# Patient Record
Sex: Female | Born: 1950 | Race: White | Hispanic: No | Marital: Married | Smoking: Former smoker
Health system: Southern US, Community
[De-identification: ages and names within clinical notes are randomized; demographics above are authoritative.]

## PROBLEM LIST (undated history)

## (undated) DIAGNOSIS — E559 Vitamin D deficiency, unspecified: Secondary | ICD-10-CM

## (undated) DIAGNOSIS — T7840XA Allergy, unspecified, initial encounter: Secondary | ICD-10-CM

## (undated) DIAGNOSIS — K649 Unspecified hemorrhoids: Secondary | ICD-10-CM

## (undated) DIAGNOSIS — C439 Malignant melanoma of skin, unspecified: Secondary | ICD-10-CM

## (undated) DIAGNOSIS — Z8619 Personal history of other infectious and parasitic diseases: Secondary | ICD-10-CM

## (undated) DIAGNOSIS — K219 Gastro-esophageal reflux disease without esophagitis: Secondary | ICD-10-CM

## (undated) DIAGNOSIS — E079 Disorder of thyroid, unspecified: Secondary | ICD-10-CM

## (undated) DIAGNOSIS — E538 Deficiency of other specified B group vitamins: Secondary | ICD-10-CM

## (undated) DIAGNOSIS — E785 Hyperlipidemia, unspecified: Secondary | ICD-10-CM

## (undated) DIAGNOSIS — Z5189 Encounter for other specified aftercare: Secondary | ICD-10-CM

## (undated) DIAGNOSIS — H269 Unspecified cataract: Secondary | ICD-10-CM

## (undated) HISTORY — DX: Vitamin D deficiency, unspecified: E55.9

## (undated) HISTORY — DX: Disorder of thyroid, unspecified: E07.9

## (undated) HISTORY — DX: Unspecified hemorrhoids: K64.9

## (undated) HISTORY — DX: Hyperlipidemia, unspecified: E78.5

## (undated) HISTORY — DX: Encounter for other specified aftercare: Z51.89

## (undated) HISTORY — PX: APPENDECTOMY: SHX54

## (undated) HISTORY — PX: MELANOMA EXCISION: SHX5266

## (undated) HISTORY — PX: CATARACT EXTRACTION, BILATERAL: SHX1313

## (undated) HISTORY — DX: Deficiency of other specified B group vitamins: E53.8

## (undated) HISTORY — DX: Personal history of other infectious and parasitic diseases: Z86.19

## (undated) HISTORY — DX: Allergy, unspecified, initial encounter: T78.40XA

## (undated) HISTORY — DX: Gastro-esophageal reflux disease without esophagitis: K21.9

## (undated) HISTORY — DX: Unspecified cataract: H26.9

## (undated) HISTORY — DX: Malignant melanoma of skin, unspecified: C43.9

## (undated) HISTORY — PX: COLONOSCOPY: SHX174

---

## 1998-08-11 ENCOUNTER — Ambulatory Visit (HOSPITAL_COMMUNITY): Admission: RE | Admit: 1998-08-11 | Discharge: 1998-08-11 | Payer: Self-pay | Admitting: Radiology

## 2003-07-04 ENCOUNTER — Other Ambulatory Visit: Admission: RE | Admit: 2003-07-04 | Discharge: 2003-07-04 | Payer: Self-pay | Admitting: Family Medicine

## 2004-07-10 ENCOUNTER — Other Ambulatory Visit: Admission: RE | Admit: 2004-07-10 | Discharge: 2004-07-10 | Payer: Self-pay | Admitting: Family Medicine

## 2005-07-16 ENCOUNTER — Other Ambulatory Visit: Admission: RE | Admit: 2005-07-16 | Discharge: 2005-07-16 | Payer: Self-pay | Admitting: *Deleted

## 2006-09-08 ENCOUNTER — Other Ambulatory Visit: Admission: RE | Admit: 2006-09-08 | Discharge: 2006-09-08 | Payer: Self-pay | Admitting: Family Medicine

## 2009-12-21 LAB — HM COLONOSCOPY: HM Colonoscopy: NORMAL

## 2010-05-23 ENCOUNTER — Other Ambulatory Visit: Payer: Self-pay | Admitting: Family Medicine

## 2010-05-23 ENCOUNTER — Other Ambulatory Visit (HOSPITAL_COMMUNITY)
Admission: RE | Admit: 2010-05-23 | Discharge: 2010-05-23 | Disposition: A | Discharge status: Home / Self Care | Payer: 59 | Source: Ambulatory Visit | Attending: Family Medicine | Admitting: Family Medicine

## 2010-05-23 DIAGNOSIS — Z124 Encounter for screening for malignant neoplasm of cervix: Secondary | ICD-10-CM | POA: Insufficient documentation

## 2010-05-23 DIAGNOSIS — Z1159 Encounter for screening for other viral diseases: Secondary | ICD-10-CM | POA: Insufficient documentation

## 2010-05-23 LAB — HM PAP SMEAR: HM Pap smear: NORMAL

## 2011-09-25 LAB — HM MAMMOGRAPHY

## 2012-11-05 LAB — LIPID PANEL
LDL Cholesterol: 187 mg/dL
LDl/HDL Ratio: 5.9

## 2012-11-05 LAB — TSH: TSH: 1.51 u[IU]/mL (ref <=5.90)

## 2012-11-05 LAB — BASIC METABOLIC PANEL WITH GFR: Glucose: 102 mg/dL

## 2013-02-25 DIAGNOSIS — K5792 Diverticulitis of intestine, part unspecified, without perforation or abscess without bleeding: Secondary | ICD-10-CM

## 2013-02-25 HISTORY — DX: Diverticulitis of intestine, part unspecified, without perforation or abscess without bleeding: K57.92

## 2013-12-08 ENCOUNTER — Telehealth: Payer: Self-pay | Admitting: *Deleted

## 2013-12-08 NOTE — Telephone Encounter (Signed)
Medical records received via fax from [place]. Records forwarded to Dr. Virgil Benedict CMA, Katherine Roan, for review/abstraction. JG//CMA

## 2013-12-21 ENCOUNTER — Encounter: Payer: Self-pay | Admitting: Family Medicine

## 2013-12-21 ENCOUNTER — Ambulatory Visit (INDEPENDENT_AMBULATORY_CARE_PROVIDER_SITE_OTHER): Payer: 59 | Admitting: Family Medicine

## 2013-12-21 VITALS — BP 122/84 | HR 81 | Temp 98.3°F | Resp 16 | Ht 69.25 in | Wt 232.1 lb

## 2013-12-21 DIAGNOSIS — E785 Hyperlipidemia, unspecified: Secondary | ICD-10-CM | POA: Insufficient documentation

## 2013-12-21 DIAGNOSIS — M6289 Other specified disorders of muscle: Secondary | ICD-10-CM | POA: Insufficient documentation

## 2013-12-21 DIAGNOSIS — C439 Malignant melanoma of skin, unspecified: Secondary | ICD-10-CM | POA: Insufficient documentation

## 2013-12-21 DIAGNOSIS — E669 Obesity, unspecified: Secondary | ICD-10-CM | POA: Insufficient documentation

## 2013-12-21 DIAGNOSIS — E039 Hypothyroidism, unspecified: Secondary | ICD-10-CM | POA: Insufficient documentation

## 2013-12-21 DIAGNOSIS — F329 Major depressive disorder, single episode, unspecified: Secondary | ICD-10-CM | POA: Insufficient documentation

## 2013-12-21 DIAGNOSIS — E038 Other specified hypothyroidism: Secondary | ICD-10-CM

## 2013-12-21 DIAGNOSIS — F32A Depression, unspecified: Secondary | ICD-10-CM

## 2013-12-21 DIAGNOSIS — E782 Mixed hyperlipidemia: Secondary | ICD-10-CM | POA: Insufficient documentation

## 2013-12-21 DIAGNOSIS — L989 Disorder of the skin and subcutaneous tissue, unspecified: Secondary | ICD-10-CM

## 2013-12-21 DIAGNOSIS — R7309 Other abnormal glucose: Secondary | ICD-10-CM | POA: Insufficient documentation

## 2013-12-21 LAB — HEPATIC FUNCTION PANEL
ALBUMIN: 3.8 g/dL (ref 3.5–5.2)
ALT: 21 U/L (ref 0–35)
AST: 21 U/L (ref 0–37)
Alkaline Phosphatase: 55 U/L (ref 39–117)
Bilirubin, Direct: 0.1 mg/dL (ref 0.0–0.3)
Total Bilirubin: 0.8 mg/dL (ref 0.2–1.2)
Total Protein: 7.2 g/dL (ref 6.0–8.3)

## 2013-12-21 LAB — BASIC METABOLIC PANEL
BUN: 14 mg/dL (ref 6–23)
CALCIUM: 10.4 mg/dL (ref 8.4–10.5)
CO2: 25 meq/L (ref 19–32)
Chloride: 106 mEq/L (ref 96–112)
Creatinine, Ser: 0.9 mg/dL (ref 0.4–1.2)
GFR: 66.17 mL/min (ref >60.00)
Glucose, Bld: 98 mg/dL (ref 70–99)
POTASSIUM: 5 meq/L (ref 3.5–5.1)
SODIUM: 139 meq/L (ref 135–145)

## 2013-12-21 LAB — HEMOGLOBIN A1C: HEMOGLOBIN A1C: 5.9 % (ref 4.6–6.5)

## 2013-12-21 LAB — CBC WITH DIFFERENTIAL/PLATELET
BASOS ABS: 0 10*3/uL (ref 0.0–0.1)
BASOS PCT: 0.8 % (ref 0.0–3.0)
EOS ABS: 0.1 10*3/uL (ref 0.0–0.7)
Eosinophils Relative: 3.3 % (ref 0.0–5.0)
HCT: 40.4 % (ref 36.0–46.0)
HEMOGLOBIN: 13.5 g/dL (ref 12.0–15.0)
Lymphocytes Relative: 28.4 % (ref 12.0–46.0)
Lymphs Abs: 1.3 10*3/uL (ref 0.7–4.0)
MCHC: 33.3 g/dL (ref 30.0–36.0)
MCV: 95.3 fl (ref 78.0–100.0)
MONO ABS: 0.4 10*3/uL (ref 0.1–1.0)
Monocytes Relative: 9.7 % (ref 3.0–12.0)
NEUTROS ABS: 2.6 10*3/uL (ref 1.4–7.7)
Neutrophils Relative %: 57.8 % (ref 43.0–77.0)
Platelets: 198 10*3/uL (ref 150.0–400.0)
RBC: 4.24 Mil/uL (ref 3.87–5.11)
RDW: 13.6 % (ref 11.5–15.5)
WBC: 4.5 10*3/uL (ref 4.0–10.5)

## 2013-12-21 LAB — LIPID PANEL
Cholesterol: 299 mg/dL — ABNORMAL HIGH (ref 0–200)
HDL: 43 mg/dL (ref >39.00)
NonHDL: 256
TRIGLYCERIDES: 246 mg/dL — AB (ref 0.0–149.0)
Total CHOL/HDL Ratio: 7
VLDL: 49.2 mg/dL — AB (ref 0.0–40.0)

## 2013-12-21 LAB — TSH: TSH: 24.42 u[IU]/mL — AB (ref 0.35–4.50)

## 2013-12-21 LAB — CK: Total CK: 153 U/L (ref 7–177)

## 2013-12-21 LAB — LDL CHOLESTEROL, DIRECT: Direct LDL: 195.4 mg/dL

## 2013-12-21 NOTE — Assessment & Plan Note (Signed)
New to provider, ongoing for pt.  On Synthroid.  Currently asymptomatic.  Check labs.  Adjust meds prn

## 2013-12-21 NOTE — Assessment & Plan Note (Signed)
New to provider, ongoing for pt.  Attempting to control w/ healthy diet and regular exercise but recently exercise has been limited.  Check labs.  Start meds prn.

## 2013-12-21 NOTE — Patient Instructions (Signed)
We'll notify you of your lab results and determine the next steps We'll call you with your dermatology appt for the skin lesions We will try and figure out what is going on or refer you to someone who can Call with any questions or concerns Welcome!  We're glad to have you!

## 2013-12-21 NOTE — Progress Notes (Signed)
   Subjective:    Patient ID: Olivia Hodge, female    DOB: [birth year], 63 y.o.   MRN: [id number]  HPI New to establish.  Previous MD- Kathyrn Lass.  Hypothyroid- chronic problem.  Currently on Synthroid.  Denies heat/cold intolerance.  Hyperlipidemia- ongoing issue, currently not on meds.  Attempting to control w/ diet and exercise.  Denies CP, SOB, HAs, some blurry vision- particularly w/ adjusting rapidly from near to far.  Elevated blood sugars- last 2 yrs fasting labs were 102, 105.  No hx of diabetes.  No known family hx.    Depression- ongoing issue.  Pt feels sxs are well controlled on Zoloft.  'it works very good'.  Skin lesions- pt has multiple areas of concern and would like referral for evaluation.  B LE fatigue- 'my legs are just exhausted'.  Difficulty w/ climbing, walking.  Now having difficulty holding things for prolonged periods of time.  Does a lot of water walking.  Has hx of this previously but episode resolved.  Describes a 'burning' w/ any incline.  + eye fatigue.   Review of Systems For ROS see HPI     Objective:   Physical Exam  Vitals reviewed. Constitutional: She is oriented to person, place, and time. She appears well-developed and well-nourished. No distress.  HENT:  Head: Normocephalic and atraumatic.  Eyes: Conjunctivae and EOM are normal. Pupils are equal, round, and reactive to light.  Neck: Normal range of motion. Neck supple. No thyromegaly present.  Cardiovascular: Normal rate, regular rhythm, normal heart sounds and intact distal pulses.   No murmur heard. Pulmonary/Chest: Effort normal and breath sounds normal. No respiratory distress.  Abdominal: Soft. She exhibits no distension. There is no tenderness.  Musculoskeletal: She exhibits no edema.  Lymphadenopathy:    She has no cervical adenopathy.  Neurological: She is alert and oriented to person, place, and time.  Skin: Skin is warm and dry.  Psychiatric: She has a normal mood and affect.  Her behavior is normal.          Assessment & Plan:

## 2013-12-21 NOTE — Assessment & Plan Note (Signed)
New.  Refer to derm due to pt's sun exposure.  Pt expressed understanding and is in agreement w/ plan.

## 2013-12-21 NOTE — Progress Notes (Signed)
Pre visit review using our clinic review tool, if applicable. No additional management support is needed unless otherwise documented below in the visit note. 

## 2013-12-21 NOTE — Assessment & Plan Note (Signed)
New to provider, ongoing for pt.  sxs well controlled on current meds.  No changes at this time.  Will follow.

## 2013-12-21 NOTE — Assessment & Plan Note (Signed)
New.  Pt reports sxs are intermittent.  Worse when walking an incline.  Reports eyes, shoulders, and hips/thighs are easily fatigue-able.  Has not had any work up done for this.  Will rule out metabolic abnormalities w/ labs and will also get acetylcholine antibodies to assess for myasthenia gravis.  Pt may need neuro referral for complete evaluation and treatment.  Will decide next steps once labs available for review.

## 2013-12-21 NOTE — Assessment & Plan Note (Signed)
New to provider.  Pt w/ hx of this x2 years.  No known family hx of DM.  Check labs.  Will follow closely.

## 2013-12-23 LAB — ACETYLCHOLINE RECEPTOR, BINDING: A CHR BINDING ABS: <0.3 nmol/L (ref <=0.30)

## 2013-12-24 ENCOUNTER — Encounter: Payer: Self-pay | Admitting: General Practice

## 2013-12-28 ENCOUNTER — Telehealth: Payer: Self-pay | Admitting: Family Medicine

## 2013-12-28 ENCOUNTER — Other Ambulatory Visit: Payer: Self-pay | Admitting: Family Medicine

## 2013-12-28 DIAGNOSIS — E038 Other specified hypothyroidism: Secondary | ICD-10-CM

## 2013-12-28 NOTE — Telephone Encounter (Signed)
Pt notified of results

## 2013-12-28 NOTE — Telephone Encounter (Signed)
Pt was referred to Endocrinology for Thyroid at her request. Pt did not want a 353mcg tablet sent to her pharmacy. She instead wants to take 1 and 1/2 of her 211mcg tablets she has at home (chart updated to reflect). Pt also refused cholesterol medication at this time.

## 2013-12-28 NOTE — Telephone Encounter (Signed)
Agree w/ endo referral but pt needs to be aware that refusal of treatment of high cholesterol- nearly twice goal- is putting her at higher risk of heart attack and/or stroke.  Recommend that she reconsider cholesterol treatment.

## 2013-12-28 NOTE — Telephone Encounter (Signed)
Pt states she would like a call back to discuss her labs that were done last week.

## 2013-12-28 NOTE — Telephone Encounter (Signed)
Pt notified, states she would like to talk to endo about that as well if possible.

## 2014-01-04 ENCOUNTER — Ambulatory Visit (INDEPENDENT_AMBULATORY_CARE_PROVIDER_SITE_OTHER): Payer: 59 | Admitting: Endocrinology

## 2014-01-04 ENCOUNTER — Encounter: Payer: Self-pay | Admitting: Endocrinology

## 2014-01-04 VITALS — BP 125/84 | HR 90 | Temp 98.5°F | Ht 69.25 in | Wt 234.0 lb

## 2014-01-04 DIAGNOSIS — R200 Anesthesia of skin: Secondary | ICD-10-CM

## 2014-01-04 DIAGNOSIS — E039 Hypothyroidism, unspecified: Secondary | ICD-10-CM

## 2014-01-04 NOTE — Patient Instructions (Addendum)
Please see the information below, about taking the thyroid pill.  Please redo the blood test in 1 month (we'll check the vitamin B-12 also).       Levothyroxine tablets What is this medicine? LEVOTHYROXINE (lee voe thye ROX een) is a thyroid hormone. This medicine can improve symptoms of thyroid deficiency such as slow speech, lack of energy, weight gain, hair loss, dry skin, and feeling cold. It also helps to treat goiter (an enlarged thyroid gland). It is also used to treat some kinds of thyroid cancer along with surgery and other medicines. This medicine may be used for other purposes; ask your health care provider or pharmacist if you have questions. COMMON BRAND NAME(S): Estre, Levo-T, Levothroid, Levoxyl, Synthroid, Thyro-Tabs, Unithroid What should I tell my health care provider before I take this medicine? They need to know if you have any of these conditions: -angina -blood clotting problems -diabetes -dieting or on a weight loss program -fertility problems -heart disease -high levels of thyroid hormone -pituitary gland problem -previous heart attack -an unusual or allergic reaction to levothyroxine, thyroid hormones, other medicines, foods, dyes, or preservatives -pregnant or trying to get pregnant -breast-feeding How should I use this medicine? Take this medicine by mouth with plenty of water. It is best to take on an empty stomach, at least 30 minutes before or 2 hours after food. Follow the directions on the prescription label. Take at the same time each day. Do not take your medicine more often than directed. Contact your pediatrician regarding the use of this medicine in children. While this drug may be prescribed for children and infants as young as a few days of age for selected conditions, precautions do apply. For infants, you may crush the tablet and place in a small amount of (5-10 ml or 1 to 2 teaspoonfuls) of water, breast milk, or non-soy based infant formula. Do  not mix with soy-based infant formula. Give as directed. Overdosage: If you think you have taken too much of this medicine contact a poison control center or emergency room at once. NOTE: This medicine is only for you. Do not share this medicine with others. What if I miss a dose? If you miss a dose, take it as soon as you can. If it is almost time for your next dose, take only that dose. Do not take double or extra doses. What may interact with this medicine? -amiodarone -antacids -anti-thyroid medicines -calcium supplements -carbamazepine -cholestyramine -colestipol -digoxin -female hormones, including contraceptive or birth control pills -iron supplements -ketamine -liquid nutrition products like Ensure -medicines for colds and breathing difficulties -medicines for diabetes -medicines for mental depression -medicines or herbals used to decrease weight or appetite -phenobarbital or other barbiturate medications -phenytoin -prednisone or other corticosteroids -rifabutin -rifampin -soy isoflavones -sucralfate -theophylline -warfarin This list may not describe all possible interactions. Give your health care provider a list of all the medicines, herbs, non-prescription drugs, or dietary supplements you use. Also tell them if you smoke, drink alcohol, or use illegal drugs. Some items may interact with your medicine. What should I watch for while using this medicine? Be sure to take this medicine with plenty of fluids. Some tablets may cause choking, gagging, or difficulty swallowing from the tablet getting stuck in your throat. Most of these problems disappear if the medicine is taken with the right amount of water or other fluids. Do not switch brands of this medicine unless your health care professional agrees with the change. Ask questions if you are  uncertain. You will need regular exams and occasional blood tests to check the response to treatment. If you are receiving this  medicine for an underactive thyroid, it may be several weeks before you notice an improvement. Check with your doctor or health care professional if your symptoms do not improve. It may be necessary for you to take this medicine for the rest of your life. Do not stop using this medicine unless your doctor or health care professional advises you to. This medicine can affect blood sugar levels. If you have diabetes, check your blood sugar as directed. You may lose some of your hair when you first start treatment. With time, this usually corrects itself. If you are going to have surgery, tell your doctor or health care professional that you are taking this medicine. What side effects may I notice from receiving this medicine? Side effects that you should report to your doctor or health care professional as soon as possible: -allergic reactions like skin rash, itching or hives, swelling of the face, lips, or tongue -chest pain -excessive sweating or intolerance to heat -fast or irregular heartbeat -nervousness -skin rash or hives -swelling of ankles, feet, or legs -tremors Side effects that usually do not require medical attention (report to your doctor or health care professional if they continue or are bothersome): -changes in appetite -changes in menstrual periods -diarrhea -hair loss -headache -trouble sleeping -weight loss This list may not describe all possible side effects. Call your doctor for medical advice about side effects. You may report side effects to FDA at 1-800-FDA-1088. Where should I keep my medicine? Keep out of the reach of children. Store at room temperature between 15 and 30 degrees C (59 and 86 degrees F). Protect from light and moisture. Keep container tightly closed. Throw away any unused medicine after the expiration date. NOTE: This sheet is a summary. It may not cover all possible information. If you have questions about this medicine, talk to your doctor,  pharmacist, or health care provider.  2015, Elsevier/Gold Standard. (2008-05-20 14:28:07)

## 2014-01-04 NOTE — Progress Notes (Signed)
Subjective:    Patient ID: Olivia Hodge, female    DOB: August 12, 1950, 63 y.o.   MRN: [id number]  HPI Pt reports hypothyroidism was dx'ed in 1977.  she has been on prescribed thyroid hormone therapy since then.  she has never taken kelp or any other type of non-prescribed thyroid product.  she has never had thyroid imaging.  she has never had thyroid surgery, or XRT to the neck.  she has never been on amiodarone or lithium.  She is ref here because of persistently elevated TSH despite a high dosage of synthroid.  She has moderate weakness of all 4 extremities, and assoc doe.  She still takes levothyroxine, 200 mcg/day.  She has no h/o malabsorption.  She does not currently take synthroid on an empty stomach.  She says she never misses it.  Over the years, her dosage requirement has varied from 125-200 mcg/day, but never higher than 200/d.   Past Medical History  Diagnosis Date  . Thyroid disease     Past Surgical History  Procedure Laterality Date  . Appendectomy      History   Social History  . Marital Status: Divorced    Spouse Name: N/A    Number of Children: N/A  . Years of Education: N/A   Occupational History  . Not on file.   Social History Main Topics  . Smoking status: Former Smoker    Quit date: 02/26/1992  . Smokeless tobacco: Not on file  . Alcohol Use: Yes  . Drug Use: No  . Sexual Activity: Not on file   Other Topics Concern  . Not on file   Social History Narrative    Current Outpatient Prescriptions on File Prior to Visit  Medication Sig Dispense Refill  . levothyroxine (SYNTHROID, LEVOTHROID) 200 MCG tablet Pt is taking 1 and 1/2 tablet daily.    Marland Kitchen loratadine (CLARITIN) 10 MG tablet Take 10 mg by mouth daily.    . NON FORMULARY Doterra supplements    . sertraline (ZOLOFT) 100 MG tablet Take 1 tablet by mouth daily.     No current facility-administered medications on file prior to visit.    Allergies  Allergen Reactions  . Penicillins Hives  .  Tetracyclines & Related Nausea And Vomiting    Family History  Problem Relation Age of Onset  . Adopted: Yes  . Thyroid disease Daughter     BP 125/84 mmHg  Pulse 90  Temp(Src) 98.5 F (36.9 C) (Oral)  Ht 5' 9.25" (1.759 m)  Wt 234 lb (106.142 kg)  BMI 34.30 kg/m2  SpO2 92%  Review of Systems denies hair loss, cough, memory loss, blurry vision, cold intolerance, rhinorrhea, easy bruising, and syncope.  She has gerd, weight gain, acral numbness, dry skin, and leg cramps. Depression is well-controlled.      Objective:   Physical Exam VITAL SIGNS:  See vs page GENERAL: no distress head: no deformity eyes: no periorbital swelling, no proptosis external nose and ears are normal NECK: There is no palpable thyroid enlargement.  No thyroid nodule is palpable.  No palpable lymphadenopathy at the anterior neck. MSK: strength diffusely 5/5 Gait: normal and steady.    Lab Results  Component Value Date   TSH 24.42* 12/21/2013   i have reviewed the following old records: 12/21/13 office note: depression is well-controlled, but she has muscle weakness.     Assessment & Plan:  Hypothyroidism, new to me.  The reason for her fluctuating synthroid requirement is unclear.  Patient is advised the following: Patient Instructions  Please see the information below, about taking the thyroid pill.  Please redo the blood test in 1 month (we'll check the vitamin B-12 also).       Levothyroxine tablets What is this medicine? LEVOTHYROXINE (lee voe thye ROX een) is a thyroid hormone. This medicine can improve symptoms of thyroid deficiency such as slow speech, lack of energy, weight gain, hair loss, dry skin, and feeling cold. It also helps to treat goiter (an enlarged thyroid gland). It is also used to treat some kinds of thyroid cancer along with surgery and other medicines. This medicine may be used for other purposes; ask your health care provider or pharmacist if you have  questions. COMMON BRAND NAME(S): Estre, Levo-T, Levothroid, Levoxyl, Synthroid, Thyro-Tabs, Unithroid What should I tell my health care provider before I take this medicine? They need to know if you have any of these conditions: -angina -blood clotting problems -diabetes -dieting or on a weight loss program -fertility problems -heart disease -high levels of thyroid hormone -pituitary gland problem -previous heart attack -an unusual or allergic reaction to levothyroxine, thyroid hormones, other medicines, foods, dyes, or preservatives -pregnant or trying to get pregnant -breast-feeding How should I use this medicine? Take this medicine by mouth with plenty of water. It is best to take on an empty stomach, at least 30 minutes before or 2 hours after food. Follow the directions on the prescription label. Take at the same time each day. Do not take your medicine more often than directed. Contact your pediatrician regarding the use of this medicine in children. While this drug may be prescribed for children and infants as young as a few days of age for selected conditions, precautions do apply. For infants, you may crush the tablet and place in a small amount of (5-10 ml or 1 to 2 teaspoonfuls) of water, breast milk, or non-soy based infant formula. Do not mix with soy-based infant formula. Give as directed. Overdosage: If you think you have taken too much of this medicine contact a poison control center or emergency room at once. NOTE: This medicine is only for you. Do not share this medicine with others. What if I miss a dose? If you miss a dose, take it as soon as you can. If it is almost time for your next dose, take only that dose. Do not take double or extra doses. What may interact with this medicine? -amiodarone -antacids -anti-thyroid medicines -calcium supplements -carbamazepine -cholestyramine -colestipol -digoxin -female hormones, including contraceptive or birth control  pills -iron supplements -ketamine -liquid nutrition products like Ensure -medicines for colds and breathing difficulties -medicines for diabetes -medicines for mental depression -medicines or herbals used to decrease weight or appetite -phenobarbital or other barbiturate medications -phenytoin -prednisone or other corticosteroids -rifabutin -rifampin -soy isoflavones -sucralfate -theophylline -warfarin This list may not describe all possible interactions. Give your health care provider a list of all the medicines, herbs, non-prescription drugs, or dietary supplements you use. Also tell them if you smoke, drink alcohol, or use illegal drugs. Some items may interact with your medicine. What should I watch for while using this medicine? Be sure to take this medicine with plenty of fluids. Some tablets may cause choking, gagging, or difficulty swallowing from the tablet getting stuck in your throat. Most of these problems disappear if the medicine is taken with the right amount of water or other fluids. Do not switch brands of this medicine unless your health care professional  agrees with the change. Ask questions if you are uncertain. You will need regular exams and occasional blood tests to check the response to treatment. If you are receiving this medicine for an underactive thyroid, it may be several weeks before you notice an improvement. Check with your doctor or health care professional if your symptoms do not improve. It may be necessary for you to take this medicine for the rest of your life. Do not stop using this medicine unless your doctor or health care professional advises you to. This medicine can affect blood sugar levels. If you have diabetes, check your blood sugar as directed. You may lose some of your hair when you first start treatment. With time, this usually corrects itself. If you are going to have surgery, tell your doctor or health care professional that you are taking  this medicine. What side effects may I notice from receiving this medicine? Side effects that you should report to your doctor or health care professional as soon as possible: -allergic reactions like skin rash, itching or hives, swelling of the face, lips, or tongue -chest pain -excessive sweating or intolerance to heat -fast or irregular heartbeat -nervousness -skin rash or hives -swelling of ankles, feet, or legs -tremors Side effects that usually do not require medical attention (report to your doctor or health care professional if they continue or are bothersome): -changes in appetite -changes in menstrual periods -diarrhea -hair loss -headache -trouble sleeping -weight loss This list may not describe all possible side effects. Call your doctor for medical advice about side effects. You may report side effects to FDA at 1-800-FDA-1088. Where should I keep my medicine? Keep out of the reach of children. Store at room temperature between 15 and 30 degrees C (59 and 86 degrees F). Protect from light and moisture. Keep container tightly closed. Throw away any unused medicine after the expiration date. NOTE: This sheet is a summary. It may not cover all possible information. If you have questions about this medicine, talk to your doctor, pharmacist, or health care provider.  2015, [employer] Standard. (2008-05-20 14:28:07)

## 2014-02-03 ENCOUNTER — Other Ambulatory Visit: Payer: Self-pay | Admitting: Endocrinology

## 2014-02-03 ENCOUNTER — Other Ambulatory Visit (INDEPENDENT_AMBULATORY_CARE_PROVIDER_SITE_OTHER): Payer: 59

## 2014-02-03 ENCOUNTER — Other Ambulatory Visit: Payer: 59

## 2014-02-03 DIAGNOSIS — R200 Anesthesia of skin: Secondary | ICD-10-CM

## 2014-02-03 DIAGNOSIS — E039 Hypothyroidism, unspecified: Secondary | ICD-10-CM

## 2014-02-03 LAB — VITAMIN B12: Vitamin B-12: 206 pg/mL — ABNORMAL LOW (ref 211–911)

## 2014-02-03 LAB — TSH: TSH: 0.08 u[IU]/mL — AB (ref 0.35–4.50)

## 2014-02-03 LAB — T4, FREE: Free T4: 2.04 ng/dL — ABNORMAL HIGH (ref 0.60–1.60)

## 2014-02-03 MED ORDER — CYANOCOBALAMIN 1000 MCG/ML IJ SOLN: 1000.0000 ug | Freq: Once | INTRAMUSCULAR | Status: DC

## 2014-02-03 MED ORDER — LEVOTHYROXINE SODIUM 137 MCG PO TABS: ORAL_TABLET | ORAL | Status: DC

## 2014-02-04 ENCOUNTER — Ambulatory Visit (INDEPENDENT_AMBULATORY_CARE_PROVIDER_SITE_OTHER): Payer: 59 | Admitting: *Deleted

## 2014-02-04 DIAGNOSIS — R7989 Other specified abnormal findings of blood chemistry: Secondary | ICD-10-CM

## 2014-02-04 DIAGNOSIS — E538 Deficiency of other specified B group vitamins: Secondary | ICD-10-CM

## 2014-02-04 MED ORDER — CYANOCOBALAMIN 1000 MCG/ML IJ SOLN
1000.0000 ug | Freq: Once | INTRAMUSCULAR | Status: AC
  Administered 2014-02-04: 1000 ug via INTRAMUSCULAR

## 2014-02-04 NOTE — Progress Notes (Signed)
Pt came in for her first Vit B12 injection per Dr. Loanne Drilling.  Pt tolerated injection well.  Next injection in 1 month.//AB/CMA

## 2014-02-16 ENCOUNTER — Emergency Department (HOSPITAL_BASED_OUTPATIENT_CLINIC_OR_DEPARTMENT_OTHER): Payer: 59

## 2014-02-16 ENCOUNTER — Encounter (HOSPITAL_BASED_OUTPATIENT_CLINIC_OR_DEPARTMENT_OTHER): Payer: Self-pay | Admitting: *Deleted

## 2014-02-16 ENCOUNTER — Encounter: Payer: Self-pay | Admitting: Medical

## 2014-02-16 ENCOUNTER — Ambulatory Visit (INDEPENDENT_AMBULATORY_CARE_PROVIDER_SITE_OTHER): Payer: 59 | Admitting: Medical

## 2014-02-16 ENCOUNTER — Emergency Department (HOSPITAL_BASED_OUTPATIENT_CLINIC_OR_DEPARTMENT_OTHER)
Admission: EM | Admit: 2014-02-16 | Discharge: 2014-02-16 | Disposition: A | Discharge status: Home / Self Care | Payer: 59 | Attending: Emergency Medicine | Admitting: Emergency Medicine

## 2014-02-16 VITALS — BP 122/80 | HR 108 | Temp 100.1°F | Ht 69.25 in | Wt 224.2 lb

## 2014-02-16 DIAGNOSIS — E079 Disorder of thyroid, unspecified: Secondary | ICD-10-CM | POA: Diagnosis not present

## 2014-02-16 DIAGNOSIS — Z79899 Other long term (current) drug therapy: Secondary | ICD-10-CM | POA: Insufficient documentation

## 2014-02-16 DIAGNOSIS — Z87891 Personal history of nicotine dependence: Secondary | ICD-10-CM | POA: Diagnosis not present

## 2014-02-16 DIAGNOSIS — Z880 Allergy status to penicillin: Secondary | ICD-10-CM | POA: Insufficient documentation

## 2014-02-16 DIAGNOSIS — K5792 Diverticulitis of intestine, part unspecified, without perforation or abscess without bleeding: Secondary | ICD-10-CM | POA: Insufficient documentation

## 2014-02-16 DIAGNOSIS — R1032 Left lower quadrant pain: Secondary | ICD-10-CM

## 2014-02-16 DIAGNOSIS — R1031 Right lower quadrant pain: Secondary | ICD-10-CM | POA: Diagnosis present

## 2014-02-16 LAB — COMPREHENSIVE METABOLIC PANEL
ALBUMIN: 3.9 g/dL (ref 3.5–5.2)
ALT: 19 U/L (ref 0–35)
ANION GAP: 8 (ref 5–15)
AST: 17 U/L (ref 0–37)
Alkaline Phosphatase: 68 U/L (ref 39–117)
BUN: 11 mg/dL (ref 6–23)
CO2: 26 mmol/L (ref 19–32)
CREATININE: 0.63 mg/dL (ref 0.50–1.10)
Calcium: 10.2 mg/dL (ref 8.4–10.5)
Chloride: 104 mEq/L (ref 96–112)
GFR calc non Af Amer: >90 mL/min (ref >90)
GLUCOSE: 116 mg/dL — AB (ref 70–99)
Potassium: 3.9 mmol/L (ref 3.5–5.1)
Sodium: 138 mmol/L (ref 135–145)
TOTAL PROTEIN: 7.1 g/dL (ref 6.0–8.3)
Total Bilirubin: 0.9 mg/dL (ref 0.3–1.2)

## 2014-02-16 LAB — CBC WITH DIFFERENTIAL/PLATELET
BASOS PCT: 0 % (ref 0–1)
Basophils Absolute: 0.1 10*3/uL (ref 0.0–0.1)
EOS ABS: 0.1 10*3/uL (ref 0.0–0.7)
Eosinophils Relative: 1 % (ref 0–5)
HEMATOCRIT: 39.4 % (ref 36.0–46.0)
Hemoglobin: 13.4 g/dL (ref 12.0–15.0)
Lymphocytes Relative: 14 % (ref 12–46)
Lymphs Abs: 1.7 10*3/uL (ref 0.7–4.0)
MCH: 31.8 pg (ref 26.0–34.0)
MCHC: 34 g/dL (ref 30.0–36.0)
MCV: 93.6 fL (ref 78.0–100.0)
MONOS PCT: 10 % (ref 3–12)
Monocytes Absolute: 1.3 10*3/uL — ABNORMAL HIGH (ref 0.1–1.0)
NEUTROS PCT: 75 % (ref 43–77)
Neutro Abs: 9.3 10*3/uL — ABNORMAL HIGH (ref 1.7–7.7)
Platelets: 271 10*3/uL (ref 150–400)
RBC: 4.21 MIL/uL (ref 3.87–5.11)
RDW: 11.9 % (ref 11.5–15.5)
WBC: 12.4 10*3/uL — ABNORMAL HIGH (ref 4.0–10.5)

## 2014-02-16 LAB — URINALYSIS, ROUTINE W REFLEX MICROSCOPIC
Bilirubin Urine: NEGATIVE
Glucose, UA: NEGATIVE mg/dL
Ketones, ur: NEGATIVE mg/dL
Nitrite: NEGATIVE
PH: 6.5 (ref 5.0–8.0)
Protein, ur: NEGATIVE mg/dL
Specific Gravity, Urine: 1.018 (ref 1.005–1.030)
Urobilinogen, UA: 0.2 mg/dL (ref 0.0–1.0)

## 2014-02-16 LAB — LIPASE, BLOOD: Lipase: 21 U/L (ref 11–59)

## 2014-02-16 LAB — URINE MICROSCOPIC-ADD ON

## 2014-02-16 MED ORDER — IOHEXOL 300 MG/ML  SOLN
100.0000 mL | Freq: Once | INTRAMUSCULAR | Status: AC | PRN
  Administered 2014-02-16: 100 mL via INTRAVENOUS

## 2014-02-16 MED ORDER — ACETAMINOPHEN 500 MG PO TABS
1000.0000 mg | ORAL_TABLET | Freq: Once | ORAL | Status: AC
  Administered 2014-02-16: 1000 mg via ORAL
  Filled 2014-02-16: qty 2

## 2014-02-16 MED ORDER — METRONIDAZOLE 500 MG PO TABS
ORAL_TABLET | ORAL | Status: AC
  Filled 2014-02-16: qty 1

## 2014-02-16 MED ORDER — CIPROFLOXACIN HCL 500 MG PO TABS
ORAL_TABLET | ORAL | Status: AC
  Filled 2014-02-16: qty 1

## 2014-02-16 MED ORDER — SODIUM CHLORIDE 0.9 % IV BOLUS (SEPSIS)
1000.0000 mL | Freq: Once | INTRAVENOUS | Status: AC
  Administered 2014-02-16: 1000 mL via INTRAVENOUS

## 2014-02-16 MED ORDER — METRONIDAZOLE 500 MG PO TABS: 500.0000 mg | ORAL_TABLET | Freq: Two times a day (BID) | ORAL | Status: DC

## 2014-02-16 MED ORDER — METRONIDAZOLE 500 MG PO TABS
500.0000 mg | ORAL_TABLET | Freq: Once | ORAL | Status: AC
  Administered 2014-02-16: 500 mg via ORAL
  Filled 2014-02-16: qty 1

## 2014-02-16 MED ORDER — IOHEXOL 300 MG/ML  SOLN
25.0000 mL | Freq: Once | INTRAMUSCULAR | Status: AC | PRN
  Administered 2014-02-16: 25 mL via ORAL

## 2014-02-16 MED ORDER — CIPROFLOXACIN HCL 500 MG PO TABS
500.0000 mg | ORAL_TABLET | Freq: Once | ORAL | Status: AC
  Administered 2014-02-16: 500 mg via ORAL
  Filled 2014-02-16: qty 1

## 2014-02-16 MED ORDER — HYDROCODONE-ACETAMINOPHEN 5-325 MG PO TABS: 1.0000 | ORAL_TABLET | Freq: Four times a day (QID) | ORAL | Status: DC | PRN

## 2014-02-16 MED ORDER — CIPROFLOXACIN HCL 500 MG PO TABS: 500.0000 mg | ORAL_TABLET | Freq: Two times a day (BID) | ORAL | Status: DC

## 2014-02-16 NOTE — Discharge Instructions (Signed)

## 2014-02-16 NOTE — ED Notes (Signed)
Pt sent from PMD office for eval, pt c/o diffuse abd pain x 2 days denies n/v/d

## 2014-02-16 NOTE — ED Provider Notes (Signed)
CSN: [id number]     Arrival date & time 02/16/14  1658 History   First MD Initiated Contact with Patient 02/16/14 1725     Chief Complaint  Patient presents with  . Abdominal Pain     (Consider location/radiation/quality/duration/timing/severity/associated sxs/prior Treatment) HPI  63 year old female presents with lower abdominal pain. Started a couple weeks ago and has been mild until last night. Last night she had more severe pain, rating it a 7 or 8 out of 10. It is a sharp lower abdominal/pelvic pain. Has felt like she's had have a bowel movement but every time she tries she cannot go. Had a bowel movement yesterday morning. No blood in her stools. Noticed a fever last night. No dysuria, urinary frequency, or hematuria. Denies any nausea or vomiting. Has started to have mild back pain since last night. When to her PCPs was sent here for further evaluation.  Past Medical History  Diagnosis Date  . Thyroid disease    Past Surgical History  Procedure Laterality Date  . Appendectomy     Family History  Problem Relation Age of Onset  . Adopted: Yes  . Thyroid disease Daughter    History  Substance Use Topics  . Smoking status: Former Smoker    Quit date: 02/26/1992  . Smokeless tobacco: Not on file  . Alcohol Use: Yes   OB History    No data available     Review of Systems  Constitutional: Positive for fever.  Gastrointestinal: Positive for abdominal pain. Negative for nausea, vomiting, diarrhea and blood in stool.  Genitourinary: Negative for dysuria, urgency, frequency and hematuria.  Musculoskeletal: Positive for back pain.  All other systems reviewed and are negative.     Allergies  Penicillins and Tetracyclines & related  Home Medications   Prior to Admission medications   Medication Sig Start Date End Date Taking? Authorizing Provider  cyanocobalamin (,VITAMIN B-12,) 1000 MCG/ML injection Inject 1 mL (1,000 mcg total) into the muscle once. 02/03/14   Renato Shin, MD  levothyroxine (SYNTHROID, LEVOTHROID) 137 MCG tablet 2 tabs daily 02/03/14   Renato Shin, MD  loratadine (CLARITIN) 10 MG tablet Take 10 mg by mouth daily.    Historical Provider, MD  NON FORMULARY Doterra supplements    Historical Provider, MD  sertraline (ZOLOFT) 100 MG tablet Take 1 tablet by mouth daily. 11/23/13   Historical Provider, MD   BP 114/69 mmHg  Pulse 105  Temp(Src) 100.4 F (38 C) (Oral)  Resp 16  Ht 5\' 10"  (1.778 m)  Wt 224 lb (101.606 kg)  BMI 32.14 kg/m2  SpO2 99% Physical Exam  Constitutional: She is oriented to person, place, and time. She appears well-developed and well-nourished.  HENT:  Head: Normocephalic and atraumatic.  Right Ear: External ear normal.  Left Ear: External ear normal.  Nose: Nose normal.  Eyes: Right eye exhibits no discharge. Left eye exhibits no discharge.  Cardiovascular: Normal rate, regular rhythm and normal heart sounds.   Pulmonary/Chest: Effort normal and breath sounds normal.  Abdominal: Soft. There is tenderness in the right lower quadrant, periumbilical area, suprapubic area and left lower quadrant.  Diffuse lower abdominal tenderness without rebound. Worse on left side  Neurological: She is alert and oriented to person, place, and time.  Skin: Skin is warm and dry.  Nursing note and vitals reviewed.   ED Course  Procedures (including critical care time) Labs Review Labs Reviewed  URINALYSIS, ROUTINE W REFLEX MICROSCOPIC - Abnormal; Notable for the following:  APPearance CLOUDY (*)    Hgb urine dipstick SMALL (*)    Leukocytes, UA MODERATE (*)    All other components within normal limits  URINE MICROSCOPIC-ADD ON - Abnormal; Notable for the following:    Squamous Epithelial / LPF FEW (*)    Bacteria, UA FEW (*)    All other components within normal limits  CBC WITH DIFFERENTIAL - Abnormal; Notable for the following:    WBC 12.4 (*)    Neutro Abs 9.3 (*)    Monocytes Absolute 1.3 (*)    All other  components within normal limits  COMPREHENSIVE METABOLIC PANEL - Abnormal; Notable for the following:    Glucose, Bld 116 (*)    All other components within normal limits  LIPASE, BLOOD    Imaging Review Ct Abdomen Pelvis W Contrast  02/16/2014   CLINICAL DATA:  Left lower quadrant abdominal pain for 1 day. History of appendectomy.  EXAM: CT ABDOMEN AND PELVIS WITH CONTRAST  TECHNIQUE: Multidetector CT imaging of the abdomen and pelvis was performed using the standard protocol following bolus administration of intravenous contrast.  CONTRAST:  32mL OMNIPAQUE IOHEXOL 300 MG/ML SOLN, 156mL OMNIPAQUE IOHEXOL 300 MG/ML SOLN  COMPARISON:  None.  FINDINGS: Normal hepatic contour. There is mild diffuse decreased attenuation of the hepatic parenchyma on this postcontrast examination suggestive of hepatic steatosis. There is a punctate (approximately 6 mm) hypoattenuating lesion within the subcapsular aspect of the posterior segment of the right lobe of the liver (image 31, series 2) which is too small likely characterize of favored to represent a hepatic cyst. No ascites. Normal appearance of the gallbladder. No radiopaque gallstones. No intra extrahepatic biliary duct dilatation.  There is symmetric enhancement and excretion of the bilateral kidneys. No definite renal stones on this postcontrast examination. 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 Additional bilateral sub cm hypo attenuating renal lesions too small to adequately characterize of favored to represent renal cysts. There is a minimal amount of likely age and body habitus related perinephric stranding. No urinary obstruction. Normal appearance of the bilateral adrenal glands, pancreas and spleen. Several small splenules are noted about the splenic hilum.  Ingested enteric contrast extends to the level of the distal small  bowel.  Colonic diverticulosis with ill-defined mesenteric stranding about the sigmoid colon within in the midline of the pelvis (representative image 77, series 2, coronal image 46, series 5). No definite evidence of perforation or definable/drainable fluid collection. The appendix is not visualized compatible with provided surgical history. Moderate size hiatal hernia. No evidence of enteric obstruction. No pneumoperitoneum, pneumatosis or portal venous gas.  Scattered atherosclerotic plaque within a normal caliber abdominal aorta. The major branch vessels of the abdominal aorta appear widely patent on this non CTA examination. No retroperitoneal, mesenteric, pelvic or inguinal lymphadenopathy.  Calcifications within the right side of the uterine body year favored to represent degenerating fibroids. Otherwise normal appearance of the pelvic organs. No discrete adnexal mass. There is a minimal amount of presumably reactive free fluid within the pelvic cul-de-sac.  Limited visualization of the lower thorax demonstrates a ill-defined approximately 1.1 x 0.6 cm slightly ground-glass nodular opacity within the perifissural aspect of the left lower lobe (image 7, series 4). There are 2 adjacent punctate (sub 3 mm) nodes also adjacent to the left major fissure (image 6). There is minimal subsegmental atelectasis within the contralateral right costophrenic  angle. No discrete focal airspace opacities. No pleural effusion.  Normal heart size.  No pericardial effusion.  No acute or aggressive osseous abnormalities. There is mild (approximately 7 mm) of anterolisthesis of L4 upon L5 with associated bilateral L5 pars defects. Regional soft tissues appear normal.  IMPRESSION: 1. Findings compatible with acute uncomplicated diverticulitis involving the sigmoid colon of the midline of the lower pelvis. No definite evidence of perforation or definable/drainable fluid collection. 2. Moderate size hiatal hernia. 3. Suspected hepatic  steatosis. 4. 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 5. Indeterminate approximately 1.1 cm ground-glass nodular opacity within the left lower lobe. Comparison with prior outside examinations (if available) is recommended. Otherwise, a follow-up chest CT at 3-29months is recommended. This recommendation follows the consensus statement: Guidelines for Management of Small Pulmonary Nodules Detected on CT Scans: A Statement from the [employer] as published in Radiology 2005; 237:395-400.   Electronically Signed   By: Sandi Mariscal M.D.   On: 02/16/2014 18:58     EKG Interpretation None      MDM   Final diagnoses:  Acute diverticulitis    Patient with uncomplicated acute diverticulitis. Has low-grade fever and mild white blood cell count but is not having any vomiting or uncontrolled pain. I discussed with patient and she prefers to go home with oral antibiotics and pain control. I discussed strict return precautions, and patient seems reliable and will follow-up closely with PCP or return here if symptoms worsen.    Ephraim Hamburger, MD 02/17/14 [phone]

## 2014-02-16 NOTE — Patient Instructions (Signed)
For your recent presentation of fever, chills, recent loose stools and severe rebound tenderness lt flank/llq on exam, I want you to be evaluated in the ED. You would likely benefit from ct abdomen and pelvis as well as stat blood work.  Follow up with [place] as needed after ED eval.

## 2014-02-16 NOTE — Progress Notes (Signed)
   Subjective:    Patient ID: Olivia Hodge, female    DOB: 04-19-1950, 63 y.o.   MRN: [id number]  HPI   Pt in with some pain in her lower abdomen area. Pain state 2 wks ago had similar symptoms. She states had lower abdominal pain. Usually after eating.Then took a break and did not eat much then the pain resolved for about one week. But then last night after eating a salad at restaurant the pain came back. Also she ate some shrimp with cream sauce. No pain on urinating. Some pain across her lower back. Patient states pain last night was 7-8 level pain. Pt has had some dark colored stools but not black. Pt states her stools have been loose on Sunday. Small but on frequent side loose stools on Sunday 2-3 that day. But feels like she needs to go. She is passing some gas. Only one abdominal sx in life and that was appendinx. Last night fever. Fever now. Some chills last night. Pt states last colonscopy 4-5 years ago.    Review of Systems See above for ros    Objective:   Physical Exam   General Appearance- Not in acute distress.  HEENT Eyes- Scleraeral/Conjuntiva-bilat- Not Yellow. Mouth & Throat- Normal.  Chest and Lung Exam Auscultation: Breath sounds:-Normal. Adventitious sounds:- No Adventitious sounds. cta  Cardiovascular Auscultation:Rythm - Regular, rate and rythm. Heart Sounds -Normal heart sounds.  Abdomen Inspection:-Inspection Normal.  Palpation/Perucssion: Palpation and Percussion of the abdomen reveal- Non Tender,  Rebound tenderness lt lower quadrant/flank, No rigidity(Guarding) and No Palpable abdominal masses.  Liver:-Normal.  Spleen:- Normal.           Assessment & Plan:

## 2014-02-16 NOTE — Progress Notes (Signed)
Pre visit review using our clinic review tool, if applicable. No additional management support is needed unless otherwise documented below in the visit note. 

## 2014-02-16 NOTE — ED Notes (Signed)
Patient transported to CT 

## 2014-02-16 NOTE — Assessment & Plan Note (Signed)
For your recent presentation of fever, chills, recent loose stools and severe rebound tenderness lt flank/llq on exam, I want you to be evaluated in the ED. You would likely benefit from ct abdomen and pelvis as well as stat blood work.  Follow up with [place] as needed after ED eval.

## 2014-02-21 ENCOUNTER — Encounter: Payer: Self-pay | Admitting: Family Medicine

## 2014-02-21 ENCOUNTER — Ambulatory Visit (INDEPENDENT_AMBULATORY_CARE_PROVIDER_SITE_OTHER): Payer: 59 | Admitting: Family Medicine

## 2014-02-21 VITALS — BP 122/68 | HR 104 | Temp 98.3°F | Resp 16 | Ht 70.0 in | Wt 226.2 lb

## 2014-02-21 DIAGNOSIS — K5732 Diverticulitis of large intestine without perforation or abscess without bleeding: Secondary | ICD-10-CM

## 2014-02-21 NOTE — Patient Instructions (Signed)
Follow up as needed Finish the antibiotics as directed Drink plenty of fluids Advance diet as tolerated Goal is high fiber diet, regular exercise, good water intake Call with any questions or concerns Happy New Year!!!

## 2014-02-21 NOTE — Progress Notes (Signed)
   Subjective:    Patient ID: Olivia Hodge, female    DOB: 1951-01-13, 63 y.o.   MRN: [id number]  HPI Diverticulitis- dx'd in ER on 12/23.  Started on Cipro and Flagyl.  Pt reports feeling better but having ongoing fatigue, some SOB w/ exertion.  abd pain has resolved.  BMs have normalized.  No N/V.   Review of Systems For ROS see HPI     Objective:   Physical Exam  Constitutional: She is oriented to person, place, and time. She appears well-developed and well-nourished. No distress.  HENT:  Head: Normocephalic and atraumatic.  Cardiovascular: Normal rate, regular rhythm, normal heart sounds and intact distal pulses.   Pulmonary/Chest: Effort normal and breath sounds normal. No respiratory distress. She has no wheezes. She has no rales.  Abdominal: Soft. Bowel sounds are normal. She exhibits no distension. There is tenderness (TTP over LLQ). There is no rebound and no guarding.  Neurological: She is alert and oriented to person, place, and time.  Skin: Skin is warm and dry.  Vitals reviewed.         Assessment & Plan:

## 2014-02-21 NOTE — Progress Notes (Signed)
Pre visit review using our clinic review tool, if applicable. No additional management support is needed unless otherwise documented below in the visit note/SLS  

## 2014-02-21 NOTE — Assessment & Plan Note (Signed)
New.  Reviewed ER visit and meds given.  Agree w/ dx and tx.  Pt's sxs are much improved.  Complete abx course as directed.  Reviewed dietary modifications.  Reviewed supportive care and red flags that should prompt return.  Pt expressed understanding and is in agreement w/ plan.

## 2014-03-10 ENCOUNTER — Ambulatory Visit: Payer: 59

## 2014-03-10 ENCOUNTER — Other Ambulatory Visit (INDEPENDENT_AMBULATORY_CARE_PROVIDER_SITE_OTHER): Payer: 59

## 2014-03-10 ENCOUNTER — Other Ambulatory Visit: Payer: Self-pay | Admitting: Endocrinology

## 2014-03-10 DIAGNOSIS — E039 Hypothyroidism, unspecified: Secondary | ICD-10-CM | POA: Diagnosis not present

## 2014-03-10 DIAGNOSIS — E538 Deficiency of other specified B group vitamins: Secondary | ICD-10-CM

## 2014-03-10 LAB — TSH: TSH: 0.015 u[IU]/mL — AB (ref 0.350–4.500)

## 2014-03-10 LAB — T4, FREE: FREE T4: 1.79 ng/dL (ref 0.80–1.80)

## 2014-03-10 MED ORDER — LEVOTHYROXINE SODIUM 125 MCG PO TABS: 125.0000 ug | ORAL_TABLET | Freq: Every day | ORAL | Status: DC

## 2014-03-10 MED ORDER — CYANOCOBALAMIN 1000 MCG/ML IJ SOLN
1000.0000 ug | Freq: Once | INTRAMUSCULAR | Status: AC
  Administered 2014-03-10: 1000 ug via INTRAMUSCULAR

## 2014-03-11 ENCOUNTER — Other Ambulatory Visit: Payer: Self-pay

## 2014-03-11 MED ORDER — LEVOTHYROXINE SODIUM 125 MCG PO TABS: 125.0000 ug | ORAL_TABLET | Freq: Every day | ORAL | Status: DC

## 2014-03-14 ENCOUNTER — Encounter: Payer: Self-pay | Admitting: Endocrinology

## 2014-03-14 ENCOUNTER — Encounter: Payer: Self-pay | Admitting: Family Medicine

## 2014-03-15 ENCOUNTER — Other Ambulatory Visit: Payer: Self-pay | Admitting: Endocrinology

## 2014-03-15 MED ORDER — LEVOTHYROXINE SODIUM 125 MCG PO TABS: ORAL_TABLET | ORAL | Status: DC

## 2014-04-19 ENCOUNTER — Telehealth: Payer: Self-pay | Admitting: Endocrinology

## 2014-04-19 NOTE — Telephone Encounter (Signed)
See below and please advise, Thanks!  

## 2014-04-19 NOTE — Telephone Encounter (Signed)
Pt attempted to get b 12 at [place] [facility] they are creating some resistence in regards to this. They need an order for the b12 and this need to be set up by [place] for her to go there please assist in this matter

## 2014-04-19 NOTE — Telephone Encounter (Signed)
Please call [facility].  Can pt please get B-12 injections there?

## 2014-04-20 NOTE — Telephone Encounter (Signed)
Lvom advising pt as soon as I am able to get through with [facility] I will call her back.

## 2014-04-20 NOTE — Telephone Encounter (Signed)
Tried to reach [facility]. Unable to get through will try again at a later time.

## 2014-04-25 NOTE — Telephone Encounter (Addendum)
Pt advised the order for the B12 have been placed. Advised pt I have been unsuccessful with reaching the [facility] to discuss. Instructed the pt if a issue were to arise to have them call our office an we can discuss that way. Pt voiced understanding.

## 2014-04-26 ENCOUNTER — Other Ambulatory Visit (INDEPENDENT_AMBULATORY_CARE_PROVIDER_SITE_OTHER): Payer: 59

## 2014-04-26 DIAGNOSIS — E538 Deficiency of other specified B group vitamins: Secondary | ICD-10-CM

## 2014-04-26 DIAGNOSIS — E039 Hypothyroidism, unspecified: Secondary | ICD-10-CM

## 2014-04-26 DIAGNOSIS — R7989 Other specified abnormal findings of blood chemistry: Secondary | ICD-10-CM

## 2014-04-26 LAB — TSH: TSH: 0.009 u[IU]/mL — ABNORMAL LOW (ref 0.350–4.500)

## 2014-04-26 LAB — T4, FREE: FREE T4: 1.5 ng/dL (ref 0.80–1.80)

## 2014-04-26 MED ORDER — CYANOCOBALAMIN 1000 MCG/ML IJ SOLN
1000.0000 ug | Freq: Once | INTRAMUSCULAR | Status: AC
  Administered 2014-04-26: 1000 ug via INTRAMUSCULAR

## 2014-04-26 NOTE — Progress Notes (Signed)
Pt was also seen for Vit B12 injection.  Pt tolerated injection well.  Next injection in 1 month.//AB/CMA

## 2014-04-29 ENCOUNTER — Encounter: Payer: Self-pay | Admitting: Endocrinology

## 2014-04-29 ENCOUNTER — Other Ambulatory Visit: Payer: Self-pay

## 2014-04-29 ENCOUNTER — Ambulatory Visit (INDEPENDENT_AMBULATORY_CARE_PROVIDER_SITE_OTHER): Payer: 59 | Admitting: Endocrinology

## 2014-04-29 VITALS — BP 138/80 | HR 103 | Temp 98.3°F | Ht 70.0 in | Wt 224.0 lb

## 2014-04-29 DIAGNOSIS — E039 Hypothyroidism, unspecified: Secondary | ICD-10-CM

## 2014-04-29 MED ORDER — LEVOTHYROXINE SODIUM 200 MCG PO TABS: 200.0000 ug | ORAL_TABLET | Freq: Every day | ORAL | Status: DC

## 2014-04-29 MED ORDER — LEVOTHYROXINE SODIUM 125 MCG PO TABS: 125.0000 ug | ORAL_TABLET | Freq: Every day | ORAL | Status: DC

## 2014-04-29 MED ORDER — CYANOCOBALAMIN 1000 MCG/ML IJ SOLN: 1000.0000 ug | INTRAMUSCULAR | Status: DC

## 2014-04-29 NOTE — Patient Instructions (Addendum)
Please reduce the thyroid pill to 200 mcg per day.  i have sent a prescription to your pharmacy.   Please recheck the blood test in 4-6 weeks.

## 2014-04-29 NOTE — Progress Notes (Signed)
   Subjective:    Patient ID: Olivia Hodge, female    DOB: June 15, 1950, 64 y.o.   MRN: [id number]  HPI Pt reports hypothyroidism was dx'ed in 1977.  she has been on prescribed thyroid hormone therapy since then.  she has never had thyroid imaging.  she has never had thyroid surgery, or XRT to the neck.  she has never been on amiodarone or lithium.  She was ref here in 2015, because of persistently elevated TSH despite a high dosage of synthroid.  She still takes levothyroxine, 200 mcg/day.  She has no h/o malabsorption.  She says she never misses it.  Over the years, her dosage requirement has varied widely.  pt states she feels well in general.   Past Medical History  Diagnosis Date  . Thyroid disease     Past Surgical History  Procedure Laterality Date  . Appendectomy      History   Social History  . Marital Status: Divorced    Spouse Name: N/A  . Number of Children: N/A  . Years of Education: N/A   Occupational History  . Not on file.   Social History Main Topics  . Smoking status: Former Smoker    Quit date: 02/26/1992  . Smokeless tobacco: Not on file  . Alcohol Use: Yes  . Drug Use: No  . Sexual Activity: Not on file   Other Topics Concern  . Not on file   Social History Narrative    Current Outpatient Prescriptions on File Prior to Visit  Medication Sig Dispense Refill  . HYDROcodone-acetaminophen (NORCO) 5-325 MG per tablet Take 1 tablet by mouth every 6 (six) hours as needed for severe pain. 20 tablet 0  . loratadine (CLARITIN) 10 MG tablet Take 10 mg by mouth daily.    . sertraline (ZOLOFT) 100 MG tablet Take 1 tablet by mouth daily.    . NON FORMULARY Doterra supplements     No current facility-administered medications on file prior to visit.    Allergies  Allergen Reactions  . Penicillins Hives  . Tetracyclines & Related Nausea And Vomiting    Family History  Problem Relation Age of Onset  . Adopted: Yes  . Thyroid disease Daughter     BP  138/80 mmHg  Pulse 103  Temp(Src) 98.3 F (36.8 C) (Oral)  Ht 5\' 10"  (1.778 m)  Wt 224 lb (101.606 kg)  BMI 32.14 kg/m2  SpO2 93%  Review of Systems She denies weight change.      Objective:   Physical Exam VITAL SIGNS:  See vs page GENERAL: no distress NECK: There is no palpable thyroid enlargement.  No thyroid nodule is palpable.  No palpable lymphadenopathy at the anterior neck.    Lab Results  Component Value Date   TSH 0.009* 04/26/2014       Assessment & Plan:  Hypothyroidism: now overreplaced.    Patient is advised the following: Patient Instructions  Please reduce the thyroid pill to 200 mcg per day.  i have sent a prescription to your pharmacy.   Please recheck the blood test in 4-6 weeks.

## 2014-05-25 ENCOUNTER — Ambulatory Visit (INDEPENDENT_AMBULATORY_CARE_PROVIDER_SITE_OTHER): Payer: 59 | Admitting: Family Medicine

## 2014-05-25 ENCOUNTER — Encounter: Payer: Self-pay | Admitting: Family Medicine

## 2014-05-25 ENCOUNTER — Other Ambulatory Visit (HOSPITAL_COMMUNITY)
Admission: RE | Admit: 2014-05-25 | Discharge: 2014-05-25 | Disposition: A | Discharge status: Home / Self Care | Payer: 59 | Source: Ambulatory Visit | Attending: Family Medicine | Admitting: Family Medicine

## 2014-05-25 VITALS — BP 126/84 | HR 89 | Temp 98.3°F | Resp 16 | Ht 69.0 in | Wt 223.4 lb

## 2014-05-25 DIAGNOSIS — Z01419 Encounter for gynecological examination (general) (routine) without abnormal findings: Secondary | ICD-10-CM | POA: Insufficient documentation

## 2014-05-25 DIAGNOSIS — Z1151 Encounter for screening for human papillomavirus (HPV): Secondary | ICD-10-CM | POA: Diagnosis present

## 2014-05-25 DIAGNOSIS — Z0000 Encounter for general adult medical examination without abnormal findings: Secondary | ICD-10-CM | POA: Diagnosis not present

## 2014-05-25 DIAGNOSIS — Z1239 Encounter for other screening for malignant neoplasm of breast: Secondary | ICD-10-CM

## 2014-05-25 DIAGNOSIS — Z124 Encounter for screening for malignant neoplasm of cervix: Secondary | ICD-10-CM | POA: Diagnosis not present

## 2014-05-25 DIAGNOSIS — E349 Endocrine disorder, unspecified: Secondary | ICD-10-CM

## 2014-05-25 DIAGNOSIS — Z23 Encounter for immunization: Secondary | ICD-10-CM | POA: Diagnosis not present

## 2014-05-25 DIAGNOSIS — E538 Deficiency of other specified B group vitamins: Secondary | ICD-10-CM | POA: Diagnosis not present

## 2014-05-25 LAB — CBC WITH DIFFERENTIAL/PLATELET
Basophils Absolute: 0.1 10*3/uL (ref 0.0–0.1)
Basophils Relative: 0.9 % (ref 0.0–3.0)
EOS ABS: 0.1 10*3/uL (ref 0.0–0.7)
Eosinophils Relative: 1.9 % (ref 0.0–5.0)
HCT: 42.7 % (ref 36.0–46.0)
Hemoglobin: 14.7 g/dL (ref 12.0–15.0)
LYMPHS ABS: 1.6 10*3/uL (ref 0.7–4.0)
Lymphocytes Relative: 26 % (ref 12.0–46.0)
MCHC: 34.4 g/dL (ref 30.0–36.0)
MCV: 88.9 fl (ref 78.0–100.0)
MONOS PCT: 10.7 % (ref 3.0–12.0)
Monocytes Absolute: 0.7 10*3/uL (ref 0.1–1.0)
NEUTROS ABS: 3.8 10*3/uL (ref 1.4–7.7)
Neutrophils Relative %: 60.5 % (ref 43.0–77.0)
Platelets: 208 10*3/uL (ref 150.0–400.0)
RBC: 4.8 Mil/uL (ref 3.87–5.11)
RDW: 14.3 % (ref 11.5–15.5)
WBC: 6.2 10*3/uL (ref 4.0–10.5)

## 2014-05-25 LAB — BASIC METABOLIC PANEL
BUN: 14 mg/dL (ref 6–23)
CO2: 29 meq/L (ref 19–32)
Calcium: 10.8 mg/dL — ABNORMAL HIGH (ref 8.4–10.5)
Chloride: 104 mEq/L (ref 96–112)
Creatinine, Ser: 0.84 mg/dL (ref 0.40–1.20)
GFR: 72.47 mL/min (ref >60.00)
Glucose, Bld: 99 mg/dL (ref 70–99)
Potassium: 3.9 mEq/L (ref 3.5–5.1)
Sodium: 135 mEq/L (ref 135–145)

## 2014-05-25 LAB — LIPID PANEL
CHOLESTEROL: 287 mg/dL — AB (ref 0–200)
HDL: 49.3 mg/dL (ref >39.00)
NONHDL: 237.7
Total CHOL/HDL Ratio: 6
Triglycerides: 249 mg/dL — ABNORMAL HIGH (ref 0.0–149.0)
VLDL: 49.8 mg/dL — AB (ref 0.0–40.0)

## 2014-05-25 LAB — HEPATIC FUNCTION PANEL
ALK PHOS: 67 U/L (ref 39–117)
ALT: 29 U/L (ref 0–35)
AST: 26 U/L (ref 0–37)
Albumin: 4.1 g/dL (ref 3.5–5.2)
BILIRUBIN DIRECT: 0.1 mg/dL (ref 0.0–0.3)
TOTAL PROTEIN: 7.2 g/dL (ref 6.0–8.3)
Total Bilirubin: 0.6 mg/dL (ref 0.2–1.2)

## 2014-05-25 LAB — VITAMIN D 25 HYDROXY (VIT D DEFICIENCY, FRACTURES): VITD: 20.44 ng/mL — ABNORMAL LOW (ref 30.00–100.00)

## 2014-05-25 LAB — LDL CHOLESTEROL, DIRECT: Direct LDL: 205 mg/dL

## 2014-05-25 LAB — TSH: TSH: 0.27 u[IU]/mL — AB (ref 0.35–4.50)

## 2014-05-25 MED ORDER — CYANOCOBALAMIN 1000 MCG/ML IJ SOLN
1000.0000 ug | Freq: Once | INTRAMUSCULAR | Status: AC
  Administered 2014-05-25: 1000 ug via INTRAMUSCULAR

## 2014-05-25 NOTE — Patient Instructions (Signed)
Follow up in 6 months to recheck cholesterol Schedule a nurse visit in 1 month for your B12 injection We'll notify you of your lab results and make any changes if needed Someone will call you with your mammogram appt Continue to make healthy food choices and get regular exercise Call with any questions or concerns Happy [place]!

## 2014-05-25 NOTE — Assessment & Plan Note (Signed)
Pt's PE WNL w/ exception of obesity.  UTD on colonoscopy.  Due for mammo (order entered).  Tdap given.  Check labs.  Anticipatory guidance provided.

## 2014-05-25 NOTE — Progress Notes (Signed)
Pre visit review using our clinic review tool, if applicable. No additional management support is needed unless otherwise documented below in the visit note. 

## 2014-05-25 NOTE — Assessment & Plan Note (Signed)
Pap collected. 

## 2014-05-25 NOTE — Progress Notes (Signed)
   Subjective:    Patient ID: Olivia Hodge, female    DOB: 1950-05-31, 64 y.o.   MRN: [id number]  HPI CPE- due for pap, mammo.  UTD on colonoscopy.   Review of Systems Patient reports no vision/ hearing changes, adenopathy,fever, weight change,  persistant/recurrent hoarseness , swallowing issues, chest pain, palpitations, edema, persistant/recurrent cough, hemoptysis, dyspnea (rest/exertional/paroxysmal nocturnal), gastrointestinal bleeding (melena, rectal bleeding), abdominal pain, significant heartburn, bowel changes, GU symptoms (dysuria, hematuria, incontinence), Gyn symptoms (abnormal  bleeding, pain),  syncope, focal weakness, memory loss, numbness & tingling, skin/hair/nail changes, abnormal bruising or bleeding, anxiety, or depression.     Objective:   Physical Exam  General Appearance:    Alert, cooperative, no distress, appears stated age  Head:    Normocephalic, without obvious abnormality, atraumatic  Eyes:    PERRL, conjunctiva/corneas clear, EOM's intact, fundi    benign, both eyes  Ears:    Normal TM's and external ear canals, both ears  Nose:   Nares normal, septum midline, mucosa normal, no drainage    or sinus tenderness  Throat:   Lips, mucosa, and tongue normal; teeth and gums normal  Neck:   Supple, symmetrical, trachea midline, no adenopathy;    Thyroid: no enlargement/tenderness/nodules  Back:     Symmetric, no curvature, ROM normal, no CVA tenderness  Lungs:     Clear to auscultation bilaterally, respirations unlabored  Chest Wall:    No tenderness or deformity   Heart:    Regular rate and rhythm, S1 and S2 normal, no murmur, rub   or gallop  Breast Exam:    No tenderness, masses, or nipple abnormality  Abdomen:     Soft, non-tender, bowel sounds active all four quadrants,    no masses, no organomegaly  Genitalia:    External genitalia normal, cervix normal in appearance, no CMT, uterus in normal size and position, adnexa w/out mass or tenderness, mucosa pink  and moist, no lesions or discharge present  Rectal:    Normal external appearance  Extremities:   Extremities normal, atraumatic, no cyanosis or edema  Pulses:   2+ and symmetric all extremities  Skin:   Skin color, texture, turgor normal, no rashes or lesions  Lymph nodes:   Cervical, supraclavicular, and axillary nodes normal  Neurologic:   CNII-XII intact, normal strength, sensation and reflexes    throughout          Assessment & Plan:

## 2014-05-25 NOTE — Assessment & Plan Note (Signed)
Chronic problem.  injxn given in office today.

## 2014-05-26 ENCOUNTER — Other Ambulatory Visit: Payer: Self-pay | Admitting: General Practice

## 2014-05-26 ENCOUNTER — Encounter: Payer: Self-pay | Admitting: Family Medicine

## 2014-05-26 DIAGNOSIS — K5732 Diverticulitis of large intestine without perforation or abscess without bleeding: Secondary | ICD-10-CM

## 2014-05-26 MED ORDER — VITAMIN D (ERGOCALCIFEROL) 1.25 MG (50000 UNIT) PO CAPS: 50000.0000 [IU] | ORAL_CAPSULE | ORAL | Status: DC

## 2014-05-26 MED ORDER — EZETIMIBE 10 MG PO TABS: 10.0000 mg | ORAL_TABLET | Freq: Every day | ORAL | Status: DC

## 2014-05-26 NOTE — Addendum Note (Signed)
Addended by: Peggyann Shoals on: 05/26/2014 05:03 PM   Modules accepted: Orders

## 2014-05-27 LAB — CYTOLOGY - PAP

## 2014-05-28 ENCOUNTER — Other Ambulatory Visit: Payer: Self-pay | Admitting: Endocrinology

## 2014-05-28 DIAGNOSIS — E039 Hypothyroidism, unspecified: Secondary | ICD-10-CM

## 2014-05-30 LAB — PARATHYROID HORMONE, INTACT (NO CA)

## 2014-05-31 LAB — HM MAMMOGRAPHY: HM MAMMO: NORMAL

## 2014-05-31 NOTE — Addendum Note (Signed)
Addended by: Modena Morrow D on: 05/31/2014 04:21 PM   Modules accepted: Orders

## 2014-06-01 LAB — PARATHYROID HORMONE, INTACT (NO CA): PTH: 95 pg/mL — AB (ref 14–64)

## 2014-06-04 ENCOUNTER — Encounter: Payer: Self-pay | Admitting: Family Medicine

## 2014-06-09 ENCOUNTER — Ambulatory Visit: Payer: 59 | Admitting: Endocrinology

## 2014-06-13 ENCOUNTER — Ambulatory Visit: Payer: 59 | Admitting: Endocrinology

## 2014-06-23 ENCOUNTER — Encounter: Payer: Self-pay | Admitting: Family Medicine

## 2014-06-24 ENCOUNTER — Ambulatory Visit: Payer: 59

## 2014-06-28 ENCOUNTER — Encounter: Payer: Self-pay | Admitting: Internal Medicine

## 2014-06-28 ENCOUNTER — Ambulatory Visit: Payer: 59 | Admitting: Endocrinology

## 2014-06-30 ENCOUNTER — Ambulatory Visit: Payer: 59 | Admitting: Endocrinology

## 2014-06-30 ENCOUNTER — Ambulatory Visit (INDEPENDENT_AMBULATORY_CARE_PROVIDER_SITE_OTHER): Payer: 59 | Admitting: *Deleted

## 2014-06-30 DIAGNOSIS — E538 Deficiency of other specified B group vitamins: Secondary | ICD-10-CM

## 2014-06-30 MED ORDER — CYANOCOBALAMIN 1000 MCG/ML IJ SOLN
1000.0000 ug | Freq: Once | INTRAMUSCULAR | Status: DC
Start: 2014-06-30 — End: 2014-06-30

## 2014-06-30 MED ORDER — CYANOCOBALAMIN 1000 MCG/ML IJ SOLN
1000.0000 ug | Freq: Once | INTRAMUSCULAR | Status: AC
  Administered 2014-06-30: 1000 ug via INTRAMUSCULAR

## 2014-06-30 NOTE — Progress Notes (Signed)
Pre visit review using our clinic review tool, if applicable. No additional management support is needed unless otherwise documented below in the visit note.  Patient tolerated injection well.  Next injection scheduled for 07/28/14.

## 2014-07-01 ENCOUNTER — Ambulatory Visit (INDEPENDENT_AMBULATORY_CARE_PROVIDER_SITE_OTHER): Payer: 59 | Admitting: Endocrinology

## 2014-07-01 ENCOUNTER — Encounter: Payer: Self-pay | Admitting: Endocrinology

## 2014-07-01 VITALS — BP 138/88 | HR 80 | Temp 98.5°F | Ht 69.0 in | Wt 225.0 lb

## 2014-07-01 DIAGNOSIS — E039 Hypothyroidism, unspecified: Secondary | ICD-10-CM | POA: Diagnosis not present

## 2014-07-01 DIAGNOSIS — E210 Primary hyperparathyroidism: Secondary | ICD-10-CM

## 2014-07-01 NOTE — Progress Notes (Signed)
Subjective:    Patient ID: Olivia Hodge, female    DOB: [birth year], 64 y.o.   MRN: [id number]  HPI  The state of at least three ongoing medical problems is addressed today, with interval history of each noted here: Pt returns for f/u of chronic primary hypothyroidism (dx'ed 1977; she has been on prescribed thyroid hormone therapy since then; she has never had thyroid imaging; she was ref here in 2015, because of persistently elevated TSH despite a high dosage of synthroid ; over the years, her dosage requirement has varied widely(100-400 mcg/d)).  pt has weight gain.  She takes synthroid as rx'ed, on an empty stomach, with water only.  Pt was first noted to have hypercalcemia in early 2016.  she has never had urolithiasis, PUD, pancreatitis, depression, or bony fracture.   She has been on ergocalciferol x 3 weeks only.  She has slight numbness of the hands and feet, but no assoc pain. Past Medical History  Diagnosis Date  . Thyroid disease   . B12 deficiency     Past Surgical History  Procedure Laterality Date  . Appendectomy      History   Social History  . Marital Status: Divorced    Spouse Name: N/A  . Number of Children: N/A  . Years of Education: N/A   Occupational History  . Not on file.   Social History Main Topics  . Smoking status: Former Smoker    Quit date: 02/26/1992  . Smokeless tobacco: Not on file  . Alcohol Use: Yes  . Drug Use: No  . Sexual Activity: Not on file   Other Topics Concern  . Not on file   Social History Narrative    Current Outpatient Prescriptions on File Prior to Visit  Medication Sig Dispense Refill  . ezetimibe (ZETIA) 10 MG tablet Take 1 tablet (10 mg total) by mouth daily. 30 tablet 6  . levothyroxine (SYNTHROID, LEVOTHROID) 200 MCG tablet Take 1 tablet (200 mcg total) by mouth daily before breakfast. 30 tablet 3  . loratadine (CLARITIN) 10 MG tablet Take 10 mg by mouth daily.    . NON FORMULARY Doterra supplements    .  sertraline (ZOLOFT) 100 MG tablet Take 1 tablet by mouth daily.    . Vitamin D, Ergocalciferol, (DRISDOL) 50000 UNITS CAPS capsule Take 1 capsule (50,000 Units total) by mouth every 7 (seven) days. 12 capsule 0   No current facility-administered medications on file prior to visit.    Allergies  Allergen Reactions  . Penicillins Hives  . Tetracyclines & Related Nausea And Vomiting    Family History  Problem Relation Age of Onset  . Adopted: Yes  . Thyroid disease Daughter     BP 138/88 mmHg  Pulse 80  Temp(Src) 98.5 F (36.9 C) (Oral)  Ht 5\' 9"  (1.753 m)  Wt 225 lb (102.059 kg)  BMI 33.21 kg/m2  SpO2 95%    Review of Systems She has dry skin.  denies galactorrhea, hematuria, memory loss, abdominal pain, urinary frequency, and constipation.      Objective:   Physical Exam VITAL SIGNS:  See vs page GENERAL: no distress NECK: There is no palpable thyroid enlargement.  No thyroid nodule is palpable.  No palpable lymphadenopathy at the anterior neck.  Gait: normal and steady   Lab Results  Component Value Date   PTH 95* 05/31/2014   CALCIUM 10.8* 05/25/2014   Lab Results  Component Value Date   TSH 0.27* 05/25/2014  Assessment & Plan:  Hypothyroidism: on rx.   Primary hyperparathyroidism, mild, new to me. Vit-D deficiency: this may be masking the true extent of hypercalcemia.  Patient is advised the following: Patient Instructions  Please continue the same medications for now. Please repeat the blood tests in approx 4 weeks.     If the calcium does not go much higher, and the bone density is not bad, all we need to do is to recheck the calcium when you return in 6 months.   addendum: check DEXA

## 2014-07-01 NOTE — Patient Instructions (Addendum)
Please continue the same medications for now. Please repeat the blood tests in approx 4 weeks.     If the calcium does not go much higher, and the bone density is not bad, all we need to do is to recheck the calcium when you return in 6 months.

## 2014-07-04 ENCOUNTER — Ambulatory Visit (INDEPENDENT_AMBULATORY_CARE_PROVIDER_SITE_OTHER)
Admission: RE | Admit: 2014-07-04 | Discharge: 2014-07-04 | Disposition: A | Discharge status: Home / Self Care | Payer: 59 | Source: Ambulatory Visit | Attending: Endocrinology | Admitting: Endocrinology

## 2014-07-04 DIAGNOSIS — E210 Primary hyperparathyroidism: Secondary | ICD-10-CM

## 2014-07-08 ENCOUNTER — Other Ambulatory Visit: Payer: 59

## 2014-07-28 ENCOUNTER — Ambulatory Visit (INDEPENDENT_AMBULATORY_CARE_PROVIDER_SITE_OTHER): Payer: 59 | Admitting: *Deleted

## 2014-07-28 ENCOUNTER — Telehealth: Payer: Self-pay | Admitting: *Deleted

## 2014-07-28 ENCOUNTER — Other Ambulatory Visit (INDEPENDENT_AMBULATORY_CARE_PROVIDER_SITE_OTHER): Payer: 59

## 2014-07-28 DIAGNOSIS — E0590 Thyrotoxicosis, unspecified without thyrotoxic crisis or storm: Secondary | ICD-10-CM

## 2014-07-28 DIAGNOSIS — E538 Deficiency of other specified B group vitamins: Secondary | ICD-10-CM

## 2014-07-28 DIAGNOSIS — E210 Primary hyperparathyroidism: Secondary | ICD-10-CM

## 2014-07-28 DIAGNOSIS — E039 Hypothyroidism, unspecified: Secondary | ICD-10-CM

## 2014-07-28 LAB — T4, FREE: Free T4: 1.69 ng/dL (ref 0.80–1.80)

## 2014-07-28 LAB — TSH: TSH: 0.072 u[IU]/mL — ABNORMAL LOW (ref 0.350–4.500)

## 2014-07-28 LAB — LIPID PANEL
CHOL/HDL RATIO: 4.4 ratio
Cholesterol: 195 mg/dL (ref 0–200)
HDL: 44 mg/dL — ABNORMAL LOW (ref >=46)
LDL Cholesterol: 121 mg/dL — ABNORMAL HIGH (ref 0–99)
Triglycerides: 148 mg/dL (ref <150)
VLDL: 30 mg/dL (ref 0–40)

## 2014-07-28 MED ORDER — CYANOCOBALAMIN 1000 MCG/ML IJ SOLN
1000.0000 ug | Freq: Once | INTRAMUSCULAR | Status: AC
  Administered 2014-07-28: 1000 ug via INTRAMUSCULAR

## 2014-07-28 NOTE — Progress Notes (Signed)
Pre visit review using our clinic review tool, if applicable. No additional management support is needed unless otherwise documented below in the visit note.  Patient tolerated injection well.  Next injection scheduled 07/28/14.

## 2014-07-28 NOTE — Addendum Note (Signed)
Addended by: Peggyann Shoals on: 07/28/2014 09:48 AM   Modules accepted: Orders

## 2014-07-28 NOTE — Telephone Encounter (Signed)
Ok- we'll do our best and if issues arrive, we'll refer back to endo

## 2014-07-28 NOTE — Telephone Encounter (Signed)
Patient would like to know if she can transfer care of her endocrine issues back to you managing?  She states that she is not doing anything extraordinary and feels confident in your care.   Please advise.

## 2014-07-28 NOTE — Telephone Encounter (Signed)
Notified patient and she stated understanding.  

## 2014-07-29 LAB — VITAMIN D 25 HYDROXY (VIT D DEFICIENCY, FRACTURES): VIT D 25 HYDROXY: 28 ng/mL — AB (ref 30–100)

## 2014-08-01 ENCOUNTER — Other Ambulatory Visit: Payer: Self-pay | Admitting: Endocrinology

## 2014-08-01 ENCOUNTER — Other Ambulatory Visit: Payer: Self-pay | Admitting: Family Medicine

## 2014-08-01 LAB — PTH, INTACT AND CALCIUM: CALCIUM: 10.2 mg/dL (ref 8.4–10.5)

## 2014-08-01 MED ORDER — LEVOTHYROXINE SODIUM 175 MCG PO TABS: 175.0000 ug | ORAL_TABLET | Freq: Every day | ORAL | Status: DC

## 2014-08-02 MED ORDER — VITAMIN D (ERGOCALCIFEROL) 1.25 MG (50000 UNIT) PO CAPS: 50000.0000 [IU] | ORAL_CAPSULE | ORAL | Status: DC

## 2014-08-02 NOTE — Telephone Encounter (Signed)
Per labs drawn at Dr. Cordelia Pen on 07/28/14 pt vitamin D was 28 ok to refill ergocalciferol 50,000iu?

## 2014-08-03 ENCOUNTER — Other Ambulatory Visit: Payer: Self-pay | Admitting: General Practice

## 2014-08-03 ENCOUNTER — Other Ambulatory Visit: Payer: Self-pay | Admitting: Family Medicine

## 2014-08-03 DIAGNOSIS — E0590 Thyrotoxicosis, unspecified without thyrotoxic crisis or storm: Secondary | ICD-10-CM

## 2014-08-03 DIAGNOSIS — E038 Other specified hypothyroidism: Secondary | ICD-10-CM

## 2014-08-03 MED ORDER — EZETIMIBE 10 MG PO TABS
10.0000 mg | ORAL_TABLET | Freq: Every day | ORAL | Status: DC
Start: 2014-08-03 — End: 2015-01-15

## 2014-08-03 NOTE — Addendum Note (Signed)
Addended by: Peggyann Shoals on: 08/03/2014 11:33 AM   Modules accepted: Orders

## 2014-08-03 NOTE — Telephone Encounter (Signed)
Med filled.  

## 2014-08-09 ENCOUNTER — Other Ambulatory Visit: Payer: 59

## 2014-08-09 DIAGNOSIS — E038 Other specified hypothyroidism: Secondary | ICD-10-CM

## 2014-08-10 LAB — PTH, INTACT AND CALCIUM
Calcium: 10.3 mg/dL (ref 8.4–10.5)
PTH: 91 pg/mL — ABNORMAL HIGH (ref 14–64)

## 2014-09-01 ENCOUNTER — Ambulatory Visit (INDEPENDENT_AMBULATORY_CARE_PROVIDER_SITE_OTHER): Payer: 59 | Admitting: *Deleted

## 2014-09-01 DIAGNOSIS — E538 Deficiency of other specified B group vitamins: Secondary | ICD-10-CM | POA: Diagnosis not present

## 2014-09-01 MED ORDER — CYANOCOBALAMIN 1000 MCG/ML IJ SOLN
1000.0000 ug | Freq: Once | INTRAMUSCULAR | Status: AC
  Administered 2014-09-01: 1000 ug via INTRAMUSCULAR

## 2014-09-01 NOTE — Progress Notes (Signed)
Pre visit review using our clinic review tool, if applicable. No additional management support is needed unless otherwise documented below in the visit note.  Patient tolerated injection well.  Next scheduled injection on 09/29/14.

## 2014-09-27 ENCOUNTER — Encounter: Payer: Self-pay | Admitting: Family Medicine

## 2014-09-28 ENCOUNTER — Other Ambulatory Visit: Payer: Self-pay | Admitting: Family Medicine

## 2014-09-28 DIAGNOSIS — E210 Primary hyperparathyroidism: Secondary | ICD-10-CM

## 2014-09-28 DIAGNOSIS — E038 Other specified hypothyroidism: Secondary | ICD-10-CM

## 2014-09-29 ENCOUNTER — Ambulatory Visit (INDEPENDENT_AMBULATORY_CARE_PROVIDER_SITE_OTHER): Payer: 59 | Admitting: *Deleted

## 2014-09-29 DIAGNOSIS — E038 Other specified hypothyroidism: Secondary | ICD-10-CM | POA: Diagnosis not present

## 2014-09-29 DIAGNOSIS — E210 Primary hyperparathyroidism: Secondary | ICD-10-CM

## 2014-09-29 DIAGNOSIS — E538 Deficiency of other specified B group vitamins: Secondary | ICD-10-CM | POA: Diagnosis not present

## 2014-09-29 LAB — TSH: TSH: 0.37 u[IU]/mL (ref 0.35–4.50)

## 2014-09-29 MED ORDER — CYANOCOBALAMIN 1000 MCG/ML IJ SOLN
1000.0000 ug | Freq: Once | INTRAMUSCULAR | Status: AC
  Administered 2014-09-29: 1000 ug via INTRAMUSCULAR

## 2014-09-29 NOTE — Progress Notes (Signed)
Pre visit review using our clinic review tool, if applicable. No additional management support is needed unless otherwise documented below in the visit note.  Patient tolerated injection well.  Next injection scheduled 11/01/14.

## 2014-09-30 LAB — PTH, INTACT AND CALCIUM
CALCIUM: 10.3 mg/dL (ref 8.4–10.5)
PTH: 97 pg/mL — ABNORMAL HIGH (ref 14–64)

## 2014-09-30 MED ORDER — LEVOTHYROXINE SODIUM 175 MCG PO TABS: 175.0000 ug | ORAL_TABLET | Freq: Every day | ORAL | Status: DC

## 2014-10-02 ENCOUNTER — Other Ambulatory Visit: Payer: Self-pay | Admitting: Endocrinology

## 2014-10-24 ENCOUNTER — Other Ambulatory Visit: Payer: Self-pay | Admitting: Family Medicine

## 2014-10-24 NOTE — Telephone Encounter (Signed)
Med denied, pt needs to continue OTC vitamin D 2000iu daily.

## 2014-11-01 ENCOUNTER — Ambulatory Visit (INDEPENDENT_AMBULATORY_CARE_PROVIDER_SITE_OTHER): Payer: 59

## 2014-11-01 DIAGNOSIS — E538 Deficiency of other specified B group vitamins: Secondary | ICD-10-CM | POA: Diagnosis not present

## 2014-11-01 NOTE — Progress Notes (Signed)
Pre visit review using our clinic review tool, if applicable. No additional management support is needed unless otherwise documented below in the visit note. Patient in for B12 injection. Patient tolerated well.

## 2014-11-24 ENCOUNTER — Ambulatory Visit: Payer: 59 | Admitting: Family Medicine

## 2014-11-25 ENCOUNTER — Ambulatory Visit: Payer: 59 | Admitting: Family Medicine

## 2014-12-01 ENCOUNTER — Ambulatory Visit (INDEPENDENT_AMBULATORY_CARE_PROVIDER_SITE_OTHER): Payer: 59

## 2014-12-01 DIAGNOSIS — E538 Deficiency of other specified B group vitamins: Secondary | ICD-10-CM

## 2014-12-01 MED ORDER — CYANOCOBALAMIN 1000 MCG/ML IJ SOLN
1000.0000 ug | Freq: Once | INTRAMUSCULAR | Status: AC
  Administered 2014-12-01: 1000 ug via INTRAMUSCULAR

## 2014-12-01 NOTE — Progress Notes (Signed)
Pre visit review using our clinic review tool, if applicable. No additional management support is needed unless otherwise documented below in the visit note. 

## 2014-12-01 NOTE — Addendum Note (Signed)
Addended by: Vernie Shanks E on: 12/01/2014 03:40 PM   Modules accepted: Medications

## 2014-12-22 ENCOUNTER — Encounter: Payer: Self-pay | Admitting: Family Medicine

## 2014-12-22 ENCOUNTER — Ambulatory Visit (INDEPENDENT_AMBULATORY_CARE_PROVIDER_SITE_OTHER): Payer: 59 | Admitting: Family Medicine

## 2014-12-22 VITALS — BP 128/86 | HR 82 | Temp 98.6°F | Resp 16 | Ht 69.0 in | Wt 220.4 lb

## 2014-12-22 DIAGNOSIS — B029 Zoster without complications: Secondary | ICD-10-CM | POA: Diagnosis not present

## 2014-12-22 DIAGNOSIS — E785 Hyperlipidemia, unspecified: Secondary | ICD-10-CM

## 2014-12-22 LAB — BASIC METABOLIC PANEL
BUN: 14 mg/dL (ref 6–23)
CALCIUM: 10.6 mg/dL — AB (ref 8.4–10.5)
CO2: 29 meq/L (ref 19–32)
Chloride: 105 mEq/L (ref 96–112)
Creatinine, Ser: 0.8 mg/dL (ref 0.40–1.20)
GFR: 76.53 mL/min (ref >60.00)
Glucose, Bld: 140 mg/dL — ABNORMAL HIGH (ref 70–99)
POTASSIUM: 4.2 meq/L (ref 3.5–5.1)
SODIUM: 140 meq/L (ref 135–145)

## 2014-12-22 LAB — HEPATIC FUNCTION PANEL
ALK PHOS: 67 U/L (ref 39–117)
ALT: 17 U/L (ref 0–35)
AST: 16 U/L (ref 0–37)
Albumin: 4.1 g/dL (ref 3.5–5.2)
BILIRUBIN DIRECT: 0.1 mg/dL (ref 0.0–0.3)
TOTAL PROTEIN: 6.9 g/dL (ref 6.0–8.3)
Total Bilirubin: 0.6 mg/dL (ref 0.2–1.2)

## 2014-12-22 LAB — LIPID PANEL
Cholesterol: 210 mg/dL — ABNORMAL HIGH (ref 0–200)
HDL: 47.9 mg/dL (ref >39.00)
LDL Cholesterol: 126 mg/dL — ABNORMAL HIGH (ref 0–99)
NONHDL: 161.86
Total CHOL/HDL Ratio: 4
Triglycerides: 177 mg/dL — ABNORMAL HIGH (ref 0.0–149.0)
VLDL: 35.4 mg/dL (ref 0.0–40.0)

## 2014-12-22 MED ORDER — ZOSTER VACCINE LIVE 19400 UNT/0.65ML ~~LOC~~ SOLR: 0.6500 mL | Freq: Once | SUBCUTANEOUS | Status: DC

## 2014-12-22 NOTE — Assessment & Plan Note (Signed)
Chronic problem.  Tolerating zetia w/o difficulty.  Applauded her recent weight loss.  Encouraged her to continue healthy diet and regular exercise.  Check labs.  Adjust meds prn.

## 2014-12-22 NOTE — Progress Notes (Signed)
Pre visit review using our clinic review tool, if applicable. No additional management support is needed unless otherwise documented below in the visit note. 

## 2014-12-22 NOTE — Progress Notes (Signed)
   Subjective:    Patient ID: Olivia Hodge, female    DOB: [birth year], 64 y.o.   MRN: [id number]  HPI Hyperlipidemia- chronic problem, on Zetia due to statin intolerance.  Pt has lost 5 lbs since last visit.  'trying to eat reasonable'.  Some exercise- water walking.  No CP, SOB, HAs, visual changes, edema, abd pain, N/V, myalgias.  Shingles- R upper thigh, pt developed painful bubbly rash ~3 weeks ago.   Review of Systems For ROS see HPI     Objective:   Physical Exam  Constitutional: She is oriented to person, place, and time. She appears well-developed and well-nourished. No distress.  HENT:  Head: Normocephalic and atraumatic.  Eyes: Conjunctivae and EOM are normal. Pupils are equal, round, and reactive to light.  Neck: Normal range of motion. Neck supple. No thyromegaly present.  Cardiovascular: Normal rate, regular rhythm, normal heart sounds and intact distal pulses.   No murmur heard. Pulmonary/Chest: Effort normal and breath sounds normal. No respiratory distress.  Abdominal: Soft. She exhibits no distension. There is no tenderness.  Musculoskeletal: She exhibits no edema.  Lymphadenopathy:    She has no cervical adenopathy.  Neurological: She is alert and oriented to person, place, and time.  Skin: Skin is warm and dry. Rash (resolving vesicular rash on R upper thigh) noted.  Psychiatric: She has a normal mood and affect. Her behavior is normal.  Vitals reviewed.         Assessment & Plan:

## 2014-12-22 NOTE — Assessment & Plan Note (Signed)
New.  Pt w/ resolving shingles outbreak on R thigh.  Too late to start valtrex.  Pt to get Zostavax at pharmacy.  Discussed need to call early in course of next outbreak.  Pt expressed understanding and is in agreement w/ plan.

## 2014-12-22 NOTE — Patient Instructions (Signed)
Schedule your complete physical in 6 months We'll notify you of your lab results and make any changes if needed Keep up the good work on healthy diet and regular exercise- you're doing great! If you again develop a painful or burning rash- call so we can start the [facility] Call with any questions or concerns If you want to join [place] at the new [place] office, any scheduled appointments will automatically transfer and we will see you at [address] [place] [address] [place], [place] [address]  Happy Fall!!!

## 2014-12-23 ENCOUNTER — Other Ambulatory Visit: Payer: Self-pay | Admitting: Family Medicine

## 2014-12-23 DIAGNOSIS — R7309 Other abnormal glucose: Secondary | ICD-10-CM

## 2015-01-02 ENCOUNTER — Ambulatory Visit: Payer: 59 | Admitting: Endocrinology

## 2015-01-04 ENCOUNTER — Ambulatory Visit: Payer: 59

## 2015-01-10 ENCOUNTER — Ambulatory Visit: Payer: 59

## 2015-01-12 ENCOUNTER — Ambulatory Visit (INDEPENDENT_AMBULATORY_CARE_PROVIDER_SITE_OTHER): Payer: Medicare Other | Admitting: Behavioral Health

## 2015-01-12 DIAGNOSIS — E538 Deficiency of other specified B group vitamins: Secondary | ICD-10-CM | POA: Diagnosis not present

## 2015-01-12 MED ORDER — CYANOCOBALAMIN 1000 MCG/ML IJ SOLN
1000.0000 ug | Freq: Once | INTRAMUSCULAR | Status: AC
  Administered 2015-01-12: 1000 ug via INTRAMUSCULAR

## 2015-01-12 NOTE — Progress Notes (Signed)
Pre visit review using our clinic review tool, if applicable. No additional management support is needed unless otherwise documented below in the visit note.  Patient tolerated injection well.  Next injection scheduled for 02/15/15 at 9:30 AM.

## 2015-01-15 ENCOUNTER — Other Ambulatory Visit: Payer: Self-pay | Admitting: Family Medicine

## 2015-01-16 NOTE — Telephone Encounter (Signed)
Medication filled to pharmacy as requested.   

## 2015-02-15 ENCOUNTER — Ambulatory Visit (INDEPENDENT_AMBULATORY_CARE_PROVIDER_SITE_OTHER): Payer: Medicare Other

## 2015-02-15 ENCOUNTER — Other Ambulatory Visit: Payer: Self-pay

## 2015-02-15 DIAGNOSIS — E538 Deficiency of other specified B group vitamins: Secondary | ICD-10-CM | POA: Diagnosis not present

## 2015-02-15 MED ORDER — SERTRALINE HCL 100 MG PO TABS: 100.0000 mg | ORAL_TABLET | Freq: Every day | ORAL | Status: DC

## 2015-02-15 MED ORDER — CYANOCOBALAMIN 1000 MCG/ML IJ SOLN
1000.0000 ug | Freq: Once | INTRAMUSCULAR | Status: AC
  Administered 2015-02-15: 1000 ug via INTRAMUSCULAR

## 2015-02-15 NOTE — Progress Notes (Signed)
Pre visit review using our clinic review tool, if applicable. No additional management support is needed unless otherwise documented below in the visit note.  Patient in for B12 injection. Given IM Left deltoid. Tolerated well.

## 2015-03-10 ENCOUNTER — Other Ambulatory Visit: Payer: Self-pay | Admitting: Family Medicine

## 2015-03-15 ENCOUNTER — Ambulatory Visit (INDEPENDENT_AMBULATORY_CARE_PROVIDER_SITE_OTHER): Payer: Medicare Other | Admitting: Behavioral Health

## 2015-03-15 DIAGNOSIS — E538 Deficiency of other specified B group vitamins: Secondary | ICD-10-CM

## 2015-03-15 MED ORDER — CYANOCOBALAMIN 1000 MCG/ML IJ SOLN
1000.0000 ug | Freq: Once | INTRAMUSCULAR | Status: AC
  Administered 2015-03-15: 1000 ug via INTRAMUSCULAR

## 2015-03-15 NOTE — Progress Notes (Signed)
Pre visit review using our clinic review tool, if applicable. No additional management support is needed unless otherwise documented below in the visit note.  Patient tolerated injection well.  Next injection scheduled for 04/19/15 at 9:30 AM.

## 2015-04-18 ENCOUNTER — Ambulatory Visit (INDEPENDENT_AMBULATORY_CARE_PROVIDER_SITE_OTHER): Payer: Medicare Other | Admitting: Behavioral Health

## 2015-04-18 DIAGNOSIS — E538 Deficiency of other specified B group vitamins: Secondary | ICD-10-CM | POA: Diagnosis not present

## 2015-04-18 MED ORDER — CYANOCOBALAMIN 1000 MCG/ML IJ SOLN
1000.0000 ug | Freq: Once | INTRAMUSCULAR | Status: AC
  Administered 2015-04-18: 1000 ug via INTRAMUSCULAR

## 2015-04-18 NOTE — Progress Notes (Signed)
Pre visit review using our clinic review tool, if applicable. No additional management support is needed unless otherwise documented below in the visit note.  Patient in office today for B12 injection. IM given in Left Deltoid. Patient tolerated injection well. Next appointment scheduled for 05/17/15 at 9:30 AM.

## 2015-04-19 ENCOUNTER — Ambulatory Visit: Payer: Medicare Other

## 2015-05-10 ENCOUNTER — Telehealth: Payer: Self-pay | Admitting: Behavioral Health

## 2015-05-10 ENCOUNTER — Encounter: Payer: Self-pay | Admitting: Behavioral Health

## 2015-05-10 NOTE — Telephone Encounter (Signed)
Pre-Visit Call completed with patient and chart updated.   Pre-Visit Info documented in Specialty Comments under [facility].    

## 2015-05-11 ENCOUNTER — Other Ambulatory Visit: Payer: Self-pay | Admitting: Family Medicine

## 2015-05-11 ENCOUNTER — Encounter: Payer: Self-pay | Admitting: Family Medicine

## 2015-05-11 ENCOUNTER — Ambulatory Visit (INDEPENDENT_AMBULATORY_CARE_PROVIDER_SITE_OTHER): Payer: Medicare Other | Admitting: Family Medicine

## 2015-05-11 VITALS — BP 142/78 | HR 81 | Temp 97.9°F | Ht 69.0 in | Wt 221.1 lb

## 2015-05-11 DIAGNOSIS — Z0000 Encounter for general adult medical examination without abnormal findings: Secondary | ICD-10-CM

## 2015-05-11 DIAGNOSIS — E210 Primary hyperparathyroidism: Secondary | ICD-10-CM

## 2015-05-11 DIAGNOSIS — F32A Depression, unspecified: Secondary | ICD-10-CM

## 2015-05-11 DIAGNOSIS — E785 Hyperlipidemia, unspecified: Secondary | ICD-10-CM

## 2015-05-11 DIAGNOSIS — E538 Deficiency of other specified B group vitamins: Secondary | ICD-10-CM

## 2015-05-11 DIAGNOSIS — F329 Major depressive disorder, single episode, unspecified: Secondary | ICD-10-CM

## 2015-05-11 DIAGNOSIS — E039 Hypothyroidism, unspecified: Secondary | ICD-10-CM | POA: Diagnosis not present

## 2015-05-11 DIAGNOSIS — R7309 Other abnormal glucose: Secondary | ICD-10-CM | POA: Diagnosis not present

## 2015-05-11 DIAGNOSIS — E559 Vitamin D deficiency, unspecified: Secondary | ICD-10-CM | POA: Diagnosis not present

## 2015-05-11 DIAGNOSIS — Z8619 Personal history of other infectious and parasitic diseases: Secondary | ICD-10-CM | POA: Insufficient documentation

## 2015-05-11 DIAGNOSIS — Z1231 Encounter for screening mammogram for malignant neoplasm of breast: Secondary | ICD-10-CM

## 2015-05-11 DIAGNOSIS — E669 Obesity, unspecified: Secondary | ICD-10-CM

## 2015-05-11 HISTORY — DX: Vitamin D deficiency, unspecified: E55.9

## 2015-05-11 LAB — TSH: TSH: 0.19 u[IU]/mL — AB (ref 0.35–4.50)

## 2015-05-11 LAB — VITAMIN D 25 HYDROXY (VIT D DEFICIENCY, FRACTURES): VITD: 13.03 ng/mL — ABNORMAL LOW (ref 30.00–100.00)

## 2015-05-11 LAB — COMPREHENSIVE METABOLIC PANEL
ALT: 15 U/L (ref 0–35)
AST: 14 U/L (ref 0–37)
Albumin: 4.3 g/dL (ref 3.5–5.2)
Alkaline Phosphatase: 65 U/L (ref 39–117)
BUN: 14 mg/dL (ref 6–23)
CHLORIDE: 104 meq/L (ref 96–112)
CO2: 31 meq/L (ref 19–32)
CREATININE: 0.76 mg/dL (ref 0.40–1.20)
Calcium: 10.5 mg/dL (ref 8.4–10.5)
GFR: 81.1 mL/min (ref >60.00)
Glucose, Bld: 109 mg/dL — ABNORMAL HIGH (ref 70–99)
POTASSIUM: 4 meq/L (ref 3.5–5.1)
SODIUM: 139 meq/L (ref 135–145)
Total Bilirubin: 0.8 mg/dL (ref 0.2–1.2)
Total Protein: 7.1 g/dL (ref 6.0–8.3)

## 2015-05-11 LAB — VITAMIN B12

## 2015-05-11 LAB — CBC
HEMATOCRIT: 42.1 % (ref 36.0–46.0)
HEMOGLOBIN: 14.2 g/dL (ref 12.0–15.0)
MCHC: 33.7 g/dL (ref 30.0–36.0)
MCV: 92 fl (ref 78.0–100.0)
Platelets: 208 10*3/uL (ref 150.0–400.0)
RBC: 4.57 Mil/uL (ref 3.87–5.11)
RDW: 13.3 % (ref 11.5–15.5)
WBC: 4.5 10*3/uL (ref 4.0–10.5)

## 2015-05-11 LAB — LIPID PANEL
CHOL/HDL RATIO: 4
CHOLESTEROL: 202 mg/dL — AB (ref 0–200)
HDL: 50.4 mg/dL (ref >39.00)
LDL CALC: 122 mg/dL — AB (ref 0–99)
NONHDL: 151.49
Triglycerides: 148 mg/dL (ref 0.0–149.0)
VLDL: 29.6 mg/dL (ref 0.0–40.0)

## 2015-05-11 LAB — HEMOGLOBIN A1C: Hgb A1c MFr Bld: 5.9 % (ref 4.6–6.5)

## 2015-05-11 MED ORDER — CYANOCOBALAMIN 1000 MCG/ML IJ SOLN
1000.0000 ug | Freq: Once | INTRAMUSCULAR | Status: AC
  Administered 2015-05-11: 1000 ug via INTRAMUSCULAR

## 2015-05-11 MED ORDER — EZETIMIBE 10 MG PO TABS: 10.0000 mg | ORAL_TABLET | Freq: Every day | ORAL | Status: DC

## 2015-05-11 MED ORDER — SERTRALINE HCL 100 MG PO TABS: 100.0000 mg | ORAL_TABLET | Freq: Every day | ORAL | Status: DC

## 2015-05-11 NOTE — Assessment & Plan Note (Signed)
Doing well on Sertraline continue same

## 2015-05-11 NOTE — Assessment & Plan Note (Signed)
Had a bad attack back in December. Has made some changes to her diet she feeks has helped. Encouraged to start a probiotics daily

## 2015-05-11 NOTE — Assessment & Plan Note (Signed)
minimize simple carbs. Increase exercise as tolerated.  

## 2015-05-11 NOTE — Assessment & Plan Note (Signed)
Encouraged DASH diet, decrease po intake and increase exercise as tolerated. Needs 7-8 hours of sleep nightly. Avoid trans fats, eat small, frequent meals every 4-5 hours with lean proteins, complex carbs and healthy fats. Minimize simple carbs 

## 2015-05-11 NOTE — Assessment & Plan Note (Signed)
She has asked [place] to take over management will check labs today

## 2015-05-11 NOTE — Assessment & Plan Note (Addendum)
Patient encouraged to maintain heart healthy diet, regular exercise, adequate sleep. Consider daily probiotics. Take medications as prescribed [facility] - [address]  Allergies verified: UTD  Immunization Status: Flu vaccine-- 09/26/14; patient reported Tdap-- 05/25/14 PNA-- patient reported that she has not had this vaccine Shingles-- patient has not received this vaccine; she voiced that the vaccine was too expensive and her insurance will not pay for it.  A/P:  Changes to [place], [place] or Personal Hx: UTD Pap-- 05/25/14 w/ Dr. Birdie Riddle; negative MMG-- 06/10/14 w/ [facility]; bi-rads category 1-negative Bone Density-- 07/04/14 w/ [place] at [facility]; AP Spine L1-L2 (T-score 2.5), Dual Femur Neck Left (T-score 1.6), Dual Femur Neck Right (T-score 1.2) & Left Forearm (T-score 1.2); normal CCS-- 12/21/09; normal; patient reported  Care Teams Updated: Dr. Renato Shin - Endocrinology  ED/Hospital/Urgent Care Visits: Per the patient, no recent visits to the ED/Hospital or Urgent Care.

## 2015-05-11 NOTE — Patient Instructions (Addendum)
NOW Probiotic Vitamin. Available at [place] stands for "Dietary Approaches to Stop Hypertension." The DASH eating plan is a healthy eating plan that has been shown to reduce high blood pressure (hypertension). Additional health benefits may include reducing the risk of type 2 diabetes mellitus, heart disease, and stroke. The DASH eating plan may also help with weight loss. WHAT DO I NEED TO KNOW ABOUT THE DASH EATING PLAN? For the DASH eating plan, you will follow these general guidelines:  Choose foods with a percent daily value for sodium of less than 5% (as listed on the food label).  Use salt-free seasonings or herbs instead of table salt or sea salt.  Check with your health care provider or pharmacist before using salt substitutes.  Eat lower-sodium products, often labeled as "lower sodium" or "no salt added."  Eat fresh foods.  Eat more vegetables, fruits, and low-fat dairy products.  Choose whole grains. Look for the word "whole" as the first word in the ingredient list.  Choose fish and skinless chicken or [place] more often than red meat. Limit fish, poultry, and meat to 6 oz (170 g) each day.  Limit sweets, desserts, sugars, and sugary drinks.  Choose heart-healthy fats.  Limit cheese to 1 oz (28 g) per day.  Eat more home-cooked food and less restaurant, buffet, and fast food.  Limit fried foods.  Cook foods using methods other than frying.  Limit canned vegetables. If you do use them, rinse them well to decrease the sodium.  When eating at a restaurant, ask that your food be prepared with less salt, or no salt if possible. WHAT FOODS CAN I EAT? Seek help from a dietitian for individual calorie needs. Grains Whole grain or whole wheat bread. Brown rice. Whole grain or whole wheat pasta. Quinoa, bulgur, and whole grain cereals. Low-sodium cereals. Corn or whole wheat flour tortillas. Whole grain cornbread. Whole grain crackers.  Low-sodium crackers. Vegetables Fresh or frozen vegetables (raw, steamed, roasted, or grilled). Low-sodium or reduced-sodium tomato and vegetable juices. Low-sodium or reduced-sodium tomato sauce and paste. Low-sodium or reduced-sodium canned vegetables.  Fruits All fresh, canned (in natural juice), or frozen fruits. Meat and Other Protein Products Ground beef (85% or leaner), grass-fed beef, or beef trimmed of fat. Skinless chicken or [place]. Ground chicken or [place]. Pork trimmed of fat. All fish and seafood. Eggs. Dried beans, peas, or lentils. Unsalted nuts and seeds. Unsalted canned beans. Dairy Low-fat dairy products, such as skim or 1% milk, 2% or reduced-fat cheeses, low-fat ricotta or cottage cheese, or plain low-fat yogurt. Low-sodium or reduced-sodium cheeses. Fats and Oils Tub margarines without trans fats. Light or reduced-fat mayonnaise and salad dressings (reduced sodium). Avocado. Safflower, olive, or canola oils. Natural peanut or almond butter. Other Unsalted popcorn and pretzels. The items listed above may not be a complete list of recommended foods or beverages. Contact your dietitian for more options. WHAT FOODS ARE NOT RECOMMENDED? Grains White bread. White pasta. White rice. Refined cornbread. Bagels and croissants. Crackers that contain trans fat. Vegetables Creamed or fried vegetables. Vegetables in a cheese sauce. Regular canned vegetables. Regular canned tomato sauce and paste. Regular tomato and vegetable juices. Fruits Dried fruits. Canned fruit in light or heavy syrup. Fruit juice. Meat and Other Protein Products Fatty cuts of meat. Ribs, chicken wings, bacon, sausage, bologna, salami, chitterlings, fatback, hot dogs, bratwurst, and packaged luncheon meats. Salted nuts and seeds. Canned beans with salt. Dairy Whole or 2% milk, cream, half-and-half, and  cream cheese. Whole-fat or sweetened yogurt. Full-fat cheeses or blue cheese. Nondairy creamers and whipped  toppings. Processed cheese, cheese spreads, or cheese curds. Condiments Onion and garlic salt, seasoned salt, table salt, and sea salt. Canned and packaged gravies. Worcestershire sauce. Tartar sauce. Barbecue sauce. Teriyaki sauce. Soy sauce, including reduced sodium. Steak sauce. Fish sauce. Oyster sauce. Cocktail sauce. Horseradish. Ketchup and mustard. Meat flavorings and tenderizers. Bouillon cubes. Hot sauce. Tabasco sauce. Marinades. Taco seasonings. Relishes. Fats and Oils Butter, stick margarine, lard, shortening, ghee, and bacon fat. Coconut, palm kernel, or palm oils. Regular salad dressings. Other Pickles and olives. Salted popcorn and pretzels. The items listed above may not be a complete list of foods and beverages to avoid. Contact your dietitian for more information. WHERE CAN I FIND MORE INFORMATION? National Heart, Lung, and Blood Institute: travelstabloid.com   This information is not intended to replace advice given to you by your health care provider. Make sure you discuss any questions you have with your health care provider.   Document Released: 01/31/2011 Document Revised: 03/04/2014 Document Reviewed: 12/16/2012 Elsevier Interactive Patient Education [employer].

## 2015-05-11 NOTE — Progress Notes (Signed)
d  Subjective:    Patient ID: Olivia Hodge, female    DOB: [birth year], 65 y.o.   MRN: [id number]  Chief Complaint  Patient presents with  . Annual Exam    HPI Patient is in today for Annual Physical Exam.  Patient presents with a hx of thyroid disease, and diverticulitis, patient has improved diet since last episode of diverticulitis flare in last December.  Patient had shingles last fall.  Patient has a cyst on back that is painful with touch, has become more sensitive, no redness or warmth. No febrile illness or accidents. Is trying to stay physically active. Denies CP/palp/SOB/HA/congestion/fevers or GU c/o. Taking meds as prescribed    Past Medical History  Diagnosis Date  . Thyroid disease   . B12 deficiency   . Hyperlipidemia   . Vitamin D deficiency 05/11/2015  . History of chicken pox   . Allergy     Past Surgical History  Procedure Laterality Date  . Appendectomy      Family History  Problem Relation Age of Onset  . Adopted: Yes  . COPD Daughter   . Birth defects Daughter     recurrent bronchitis  . ADD / ADHD Son     Social History   Social History  . Marital Status: Divorced    Spouse Name: N/A  . Number of Children: N/A  . Years of Education: N/A   Occupational History  . Not on file.   Social History Main Topics  . Smoking status: Former Smoker    Quit date: 02/26/1992  . Smokeless tobacco: Not on file  . Alcohol Use: Yes  . Drug Use: No  . 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 Not on file   Social History Narrative    Outpatient Prescriptions Prior to Visit  Medication Sig Dispense Refill  . levothyroxine (SYNTHROID, LEVOTHROID) 175 MCG tablet TAKE 1 TABLET DAILY BEFORE BREAKFAST 90 tablet 0  . loratadine (CLARITIN) 10 MG tablet Take 10 mg by mouth daily.    . NON FORMULARY [employer] supplements    . sertraline  (ZOLOFT) 100 MG tablet Take 1 tablet (100 mg total) by mouth daily. 90 tablet 0  . ZETIA 10 MG tablet TAKE 1 TABLET DAILY 90 tablet 1   No facility-administered medications prior to visit.    Allergies  Allergen Reactions  . Penicillins Hives  . Tetracyclines & Related Nausea And Vomiting  . Erythromycin Nausea Only    Makes patient really sick    Review of Systems  Constitutional: Negative for fever and malaise/fatigue.  HENT: Negative for congestion.   Eyes: Negative for blurred vision.  Respiratory: Negative for shortness of breath.   Cardiovascular: Negative for chest pain, palpitations and leg swelling.  Gastrointestinal: Negative for nausea, abdominal pain and blood in stool.  Genitourinary: Negative for dysuria and frequency.  Musculoskeletal: Negative for falls.  Skin: Negative for rash.  Neurological: Negative for dizziness, loss of consciousness and headaches.  Endo/Heme/Allergies: Negative for environmental allergies.  Psychiatric/Behavioral: Negative for depression. The patient is not nervous/anxious.        Objective:    Physical Exam  Constitutional: Olivia Hodge is oriented to person, place, and time. Olivia Hodge appears well-developed and well-nourished. No distress.  HENT:  Head: Normocephalic and atraumatic.  Eyes: Conjunctivae are normal.  Neck: Neck supple. No thyromegaly present.  Cardiovascular: Normal rate, regular  rhythm and normal heart sounds.   No murmur heard. Pulmonary/Chest: Effort normal and breath sounds normal. No respiratory distress.  Abdominal: Soft. Bowel sounds are normal. Olivia Hodge exhibits no distension and no mass. There is no tenderness.  Musculoskeletal: Olivia Hodge exhibits no edema.  Lymphadenopathy:    Olivia Hodge has no cervical adenopathy.  Neurological: Olivia Hodge is alert and oriented to person, place, and time.  Skin: Skin is warm and dry.  Psychiatric: Olivia Hodge has a normal mood and affect. Her behavior is normal.    BP 142/78 mmHg  Pulse 81  Temp(Src) 97.9 F  (36.6 C) (Oral)  Ht 5\' 9"  (1.753 m)  Wt 221 lb 2 oz (100.302 kg)  BMI 32.64 kg/m2  SpO2 98% Wt Readings from Last 3 Encounters:  05/11/15 221 lb 2 oz (100.302 kg)  12/22/14 220 lb 6 oz (99.961 kg)  07/01/14 225 lb (102.059 kg)     Lab Results  Component Value Date   WBC 4.5 05/11/2015   HGB 14.2 05/11/2015   HCT 42.1 05/11/2015   PLT 208.0 05/11/2015   GLUCOSE 109* 05/11/2015   CHOL 202* 05/11/2015   TRIG 148.0 05/11/2015   HDL 50.40 05/11/2015   LDLDIRECT 205.0 05/25/2014   LDLCALC 122* 05/11/2015   ALT 15 05/11/2015   AST 14 05/11/2015   NA 139 05/11/2015   K 4.0 05/11/2015   CL 104 05/11/2015   CREATININE 0.76 05/11/2015   BUN 14 05/11/2015   CO2 31 05/11/2015   TSH 0.19* 05/11/2015   HGBA1C 5.9 05/11/2015    Lab Results  Component Value Date   TSH 0.19* 05/11/2015   Lab Results  Component Value Date   WBC 4.5 05/11/2015   HGB 14.2 05/11/2015   HCT 42.1 05/11/2015   MCV 92.0 05/11/2015   PLT 208.0 05/11/2015   Lab Results  Component Value Date   NA 139 05/11/2015   K 4.0 05/11/2015   CO2 31 05/11/2015   GLUCOSE 109* 05/11/2015   BUN 14 05/11/2015   CREATININE 0.76 05/11/2015   BILITOT 0.8 05/11/2015   ALKPHOS 65 05/11/2015   AST 14 05/11/2015   ALT 15 05/11/2015   PROT 7.1 05/11/2015   ALBUMIN 4.3 05/11/2015   CALCIUM 10.5 05/11/2015   ANIONGAP 8 02/16/2014   GFR 81.10 05/11/2015   Lab Results  Component Value Date   CHOL 202* 05/11/2015   Lab Results  Component Value Date   HDL 50.40 05/11/2015   Lab Results  Component Value Date   LDLCALC 122* 05/11/2015   Lab Results  Component Value Date   TRIG 148.0 05/11/2015   Lab Results  Component Value Date   CHOLHDL 4 05/11/2015   Lab Results  Component Value Date   HGBA1C 5.9 05/11/2015       Assessment & Plan:   Problem List Items Addressed This Visit    B12 deficiency    Given vitamin B12 shot today      Relevant Medications   sertraline (ZOLOFT) 100 MG tablet    ezetimibe (ZETIA) 10 MG tablet   cyanocobalamin ((VITAMIN B-12)) injection 1,000 mcg (Completed)   Other Relevant Orders   Lipid panel (Completed)   Comprehensive metabolic panel (Completed)   CBC (Completed)   TSH (Completed)   Hemoglobin A1c (Completed)   Vitamin D (25 hydroxy) (Completed)   PTH, Intact and Calcium   Vitamin B12 (Completed)   Depression    Doing well on Sertraline continue same      Relevant Medications   sertraline (  ZOLOFT) 100 MG tablet   Elevated glucose - Primary     minimize simple carbs. Increase exercise as tolerated.       Hyperlipidemia   Relevant Medications   sertraline (ZOLOFT) 100 MG tablet   ezetimibe (ZETIA) 10 MG tablet   Other Relevant Orders   Lipid panel (Completed)   Comprehensive metabolic panel (Completed)   CBC (Completed)   TSH (Completed)   Hemoglobin A1c (Completed)   Vitamin D (25 hydroxy) (Completed)   PTH, Intact and Calcium   Vitamin B12 (Completed)   Hyperparathyroidism, primary (HCC)   Relevant Medications   sertraline (ZOLOFT) 100 MG tablet   ezetimibe (ZETIA) 10 MG tablet   Other Relevant Orders   Lipid panel (Completed)   Comprehensive metabolic panel (Completed)   CBC (Completed)   TSH (Completed)   Hemoglobin A1c (Completed)   Vitamin D (25 hydroxy) (Completed)   PTH, Intact and Calcium   Vitamin B12 (Completed)   Hypothyroidism    Olivia Hodge has asked [place] to take over management will check labs today      Relevant Medications   sertraline (ZOLOFT) 100 MG tablet   ezetimibe (ZETIA) 10 MG tablet   Other Relevant Orders   Lipid panel (Completed)   Comprehensive metabolic panel (Completed)   CBC (Completed)   TSH (Completed)   Hemoglobin A1c (Completed)   Vitamin D (25 hydroxy) (Completed)   PTH, Intact and Calcium   Vitamin B12 (Completed)   Obesity (BMI 30-39.9)    Encouraged DASH diet, decrease po intake and increase exercise as tolerated. Needs 7-8 hours of sleep nightly. Avoid trans fats, eat small,  frequent meals every 4-5 hours with lean proteins, complex carbs and healthy fats. Minimize simple carbs      Physical exam    Patient encouraged to maintain heart healthy diet, regular exercise, adequate sleep. Consider daily probiotics. Take medications as prescribed [place] - [address]  Allergies verified: UTD  Immunization Status: Flu vaccine-- 09/26/14; patient reported Tdap-- 05/25/14 PNA-- patient reported that Olivia Hodge has not had this vaccine Shingles-- patient has not received this vaccine; Olivia Hodge voiced that the vaccine was too expensive and her insurance will not pay for it.  A/P:  Changes to [place], [place]mer[place]da or Personal Hx: UTD Pap-- 05/25/14 w/ Dr. Birdie Riddle; negative MMG-- 06/10/14 w/ [facility]; bi-rads category 1-negative Bone Density-- 07/04/14 w/ [facility]; AP Spine L1-L2 (T-score 2.5), Dual Femur Neck Left (T-score 1.6), Dual Femur Neck Right (T-score 1.2) & Left Forearm (T-score 1.2); normal CCS-- 12/21/09; normal; patient reported  Care Teams Updated: Dr. Renato Shin - Endocrinology  ED/Hospital/Urgent Care Visits: Per the patient, no recent visits to the ED/Hospital or Urgent Care.      Vitamin D deficiency    Check level maintain daily supplements      Relevant Medications   sertraline (ZOLOFT) 100 MG tablet   ezetimibe (ZETIA) 10 MG tablet   Other Relevant Orders   Lipid panel (Completed)   Comprehensive metabolic panel (Completed)   CBC (Completed)   TSH (Completed)   Hemoglobin A1c (Completed)   Vitamin D (25 hydroxy) (Completed)   PTH, Intact and Calcium   Vitamin B12 (Completed)      I have changed Ms. Brewbaker's ZETIA to ezetimibe. I am also having her maintain her loratadine, NON FORMULARY, levothyroxine, and sertraline. We administered cyanocobalamin.  Meds ordered this encounter  Medications  . sertraline (ZOLOFT) 100 MG tablet    Sig: Take 1 tablet (100 mg  total) by mouth daily.    Dispense:  90 tablet    Refill:  2  .  ezetimibe (ZETIA) 10 MG tablet    Sig: Take 1 tablet (10 mg total) by mouth daily.    Dispense:  90 tablet    Refill:  2  . cyanocobalamin ((VITAMIN B-12)) injection 1,000 mcg    Sig:      Penni Homans, MD

## 2015-05-11 NOTE — Assessment & Plan Note (Signed)
Given vitamin B12 shot today

## 2015-05-11 NOTE — Assessment & Plan Note (Signed)
Check level maintain daily supplements

## 2015-05-11 NOTE — Progress Notes (Signed)
Pre visit review using our clinic review tool, if applicable. No additional management support is needed unless otherwise documented below in the visit note. 

## 2015-05-12 ENCOUNTER — Other Ambulatory Visit: Payer: Self-pay | Admitting: Family Medicine

## 2015-05-12 DIAGNOSIS — E538 Deficiency of other specified B group vitamins: Secondary | ICD-10-CM

## 2015-05-12 DIAGNOSIS — E210 Primary hyperparathyroidism: Secondary | ICD-10-CM

## 2015-05-12 LAB — PTH, INTACT AND CALCIUM
Calcium: 10.3 mg/dL (ref 8.4–10.5)
PTH: 117 pg/mL — ABNORMAL HIGH (ref 14–64)

## 2015-05-16 ENCOUNTER — Ambulatory Visit: Payer: Medicare Other

## 2015-05-17 ENCOUNTER — Ambulatory Visit: Payer: Medicare Other

## 2015-06-06 ENCOUNTER — Other Ambulatory Visit: Payer: Self-pay | Admitting: Family Medicine

## 2015-06-08 ENCOUNTER — Ambulatory Visit (HOSPITAL_BASED_OUTPATIENT_CLINIC_OR_DEPARTMENT_OTHER)
Admission: RE | Admit: 2015-06-08 | Discharge: 2015-06-08 | Disposition: A | Discharge status: Home / Self Care | Payer: Medicare Other | Source: Ambulatory Visit | Attending: Family Medicine | Admitting: Family Medicine

## 2015-06-08 DIAGNOSIS — Z1231 Encounter for screening mammogram for malignant neoplasm of breast: Secondary | ICD-10-CM

## 2015-06-09 ENCOUNTER — Encounter: Payer: 59 | Admitting: Family Medicine

## 2015-06-16 ENCOUNTER — Encounter: Payer: Medicare Other | Admitting: Family Medicine

## 2015-06-22 ENCOUNTER — Ambulatory Visit (INDEPENDENT_AMBULATORY_CARE_PROVIDER_SITE_OTHER): Payer: Medicare Other | Admitting: *Deleted

## 2015-06-22 DIAGNOSIS — E538 Deficiency of other specified B group vitamins: Secondary | ICD-10-CM | POA: Diagnosis not present

## 2015-06-22 MED ORDER — CYANOCOBALAMIN 1000 MCG/ML IJ SOLN
1000.0000 ug | Freq: Once | INTRAMUSCULAR | Status: AC
  Administered 2015-06-22: 1000 ug via INTRAMUSCULAR

## 2015-06-22 NOTE — Progress Notes (Signed)
Pre visit review using our clinic review tool, if applicable. No additional management support is needed unless otherwise documented below in the visit note.  Pt tolerated injection well.   Next appt; 07/19/15  Dorrene German, RN

## 2015-07-19 ENCOUNTER — Ambulatory Visit (INDEPENDENT_AMBULATORY_CARE_PROVIDER_SITE_OTHER): Payer: Medicare Other | Admitting: *Deleted

## 2015-07-19 DIAGNOSIS — E538 Deficiency of other specified B group vitamins: Secondary | ICD-10-CM

## 2015-07-19 MED ORDER — CYANOCOBALAMIN 1000 MCG/ML IJ SOLN
1000.0000 ug | Freq: Once | INTRAMUSCULAR | Status: AC
  Administered 2015-07-19: 1000 ug via INTRAMUSCULAR

## 2015-07-19 NOTE — Progress Notes (Signed)
Pre visit review using our clinic review tool, if applicable. No additional management support is needed unless otherwise documented below in the visit note.  Pt tolerated injection well.   Next appt: 08/16/15  Dorrene German, RN

## 2015-08-10 ENCOUNTER — Other Ambulatory Visit: Payer: Medicare Other

## 2015-08-16 ENCOUNTER — Ambulatory Visit (INDEPENDENT_AMBULATORY_CARE_PROVIDER_SITE_OTHER): Payer: Medicare Other

## 2015-08-16 ENCOUNTER — Other Ambulatory Visit (INDEPENDENT_AMBULATORY_CARE_PROVIDER_SITE_OTHER): Payer: Medicare Other

## 2015-08-16 DIAGNOSIS — E538 Deficiency of other specified B group vitamins: Secondary | ICD-10-CM

## 2015-08-16 DIAGNOSIS — E210 Primary hyperparathyroidism: Secondary | ICD-10-CM

## 2015-08-16 LAB — VITAMIN B12: VITAMIN B 12: 435 pg/mL (ref 211–911)

## 2015-08-16 LAB — TSH: TSH: 1.78 u[IU]/mL (ref 0.35–4.50)

## 2015-08-16 MED ORDER — CYANOCOBALAMIN 1000 MCG/ML IJ SOLN
1000.0000 ug | Freq: Once | INTRAMUSCULAR | Status: AC
  Administered 2015-08-16: 1000 ug via INTRAMUSCULAR

## 2015-08-16 NOTE — Progress Notes (Signed)
Pre visit review using our clinic tool,if applicable. No additional management support is needed unless otherwise documented below in the visit note.   Patient in for B12 injection today per Dr. Rhae Lerner' orders due to B12 deficiency.  Given 71ml IM right deltoid. Patient tolerated well. Return appointment given for 1 month.

## 2015-09-04 ENCOUNTER — Other Ambulatory Visit: Payer: Self-pay | Admitting: Family Medicine

## 2015-09-12 ENCOUNTER — Ambulatory Visit (INDEPENDENT_AMBULATORY_CARE_PROVIDER_SITE_OTHER): Payer: Medicare Other

## 2015-09-12 DIAGNOSIS — E538 Deficiency of other specified B group vitamins: Secondary | ICD-10-CM

## 2015-09-12 MED ORDER — CYANOCOBALAMIN 1000 MCG/ML IJ SOLN
1000.0000 ug | Freq: Once | INTRAMUSCULAR | Status: AC
  Administered 2015-09-12: 1000 ug via INTRAMUSCULAR

## 2015-09-12 NOTE — Progress Notes (Signed)
Pre visit review using our clinic tool,if applicable. No additional management support is needed unless otherwise documented below in the visit note.   Patient in for B12 injection per order from Dr. Charlett Blake. Given IM Right Deltoid due to B 12 deficiency. Patient tolerated well.

## 2015-09-13 ENCOUNTER — Ambulatory Visit: Payer: Medicare Other

## 2015-10-11 ENCOUNTER — Ambulatory Visit: Payer: Medicare Other

## 2015-10-11 ENCOUNTER — Ambulatory Visit (INDEPENDENT_AMBULATORY_CARE_PROVIDER_SITE_OTHER): Payer: Medicare Other | Admitting: *Deleted

## 2015-10-11 DIAGNOSIS — E538 Deficiency of other specified B group vitamins: Secondary | ICD-10-CM | POA: Diagnosis not present

## 2015-10-11 MED ORDER — CYANOCOBALAMIN 1000 MCG/ML IJ SOLN
1000.0000 ug | Freq: Once | INTRAMUSCULAR | Status: AC
  Administered 2015-10-11: 1000 ug via INTRAMUSCULAR

## 2015-10-11 NOTE — Progress Notes (Signed)
Pre visit review using our clinic review tool, if applicable. No additional management support is needed unless otherwise documented below in the visit note.  Patient tolerated injection well.  Next appointment: 11/22/15  Dorrene German, RN

## 2015-11-13 ENCOUNTER — Encounter: Payer: Medicare Other | Admitting: Family Medicine

## 2015-11-22 ENCOUNTER — Ambulatory Visit (INDEPENDENT_AMBULATORY_CARE_PROVIDER_SITE_OTHER): Payer: Medicare Other | Admitting: Behavioral Health

## 2015-11-22 DIAGNOSIS — E538 Deficiency of other specified B group vitamins: Secondary | ICD-10-CM

## 2015-11-22 MED ORDER — CYANOCOBALAMIN 1000 MCG/ML IJ SOLN
1000.0000 ug | Freq: Once | INTRAMUSCULAR | Status: AC
  Administered 2015-11-22: 1000 ug via INTRAMUSCULAR

## 2015-11-22 NOTE — Progress Notes (Addendum)
Pre visit review using our clinic review tool, if applicable. No additional management support is needed unless otherwise documented below in the visit note.  Patient in clinic today for B12 injection. IM given in Left Deltoid. Patient tolerated injection well.  Next appointment scheduled for 12/20/15 at 9:30 AM.  Kathlene November, MD

## 2015-11-27 ENCOUNTER — Encounter: Payer: Medicare Other | Admitting: Family Medicine

## 2015-12-15 ENCOUNTER — Ambulatory Visit (INDEPENDENT_AMBULATORY_CARE_PROVIDER_SITE_OTHER): Payer: Medicare Other | Admitting: Behavioral Health

## 2015-12-15 DIAGNOSIS — E538 Deficiency of other specified B group vitamins: Secondary | ICD-10-CM | POA: Diagnosis not present

## 2015-12-15 MED ORDER — CYANOCOBALAMIN 1000 MCG/ML IJ SOLN
1000.0000 ug | Freq: Once | INTRAMUSCULAR | Status: AC
  Administered 2015-12-15: 1000 ug via INTRAMUSCULAR

## 2015-12-15 NOTE — Progress Notes (Signed)
Noted  Dartanyon Frankowski Cody, PA-C  

## 2015-12-15 NOTE — Progress Notes (Signed)
Pre visit review using our clinic review tool, if applicable. No additional management support is needed unless otherwise documented below in the visit note.  Pt tolerated injection well.    

## 2015-12-20 ENCOUNTER — Ambulatory Visit: Payer: Medicare Other

## 2015-12-22 ENCOUNTER — Encounter: Payer: Self-pay | Admitting: Family Medicine

## 2015-12-24 ENCOUNTER — Other Ambulatory Visit: Payer: Self-pay | Admitting: Family Medicine

## 2015-12-25 ENCOUNTER — Other Ambulatory Visit: Payer: Self-pay | Admitting: Family Medicine

## 2015-12-25 MED ORDER — FLUCONAZOLE 50 MG PO TABS: ORAL_TABLET | ORAL | 0 refills | Status: DC

## 2016-01-10 ENCOUNTER — Ambulatory Visit (INDEPENDENT_AMBULATORY_CARE_PROVIDER_SITE_OTHER): Payer: Medicare Other

## 2016-01-10 DIAGNOSIS — E538 Deficiency of other specified B group vitamins: Secondary | ICD-10-CM

## 2016-01-10 MED ORDER — CYANOCOBALAMIN 1000 MCG/ML IJ SOLN
1000.0000 ug | Freq: Once | INTRAMUSCULAR | Status: AC
  Administered 2016-01-10: 1000 ug via INTRAMUSCULAR

## 2016-01-10 NOTE — Progress Notes (Signed)
Pre visit review using our clinic tool,if applicable. No additional management support is needed unless otherwise documented below in the visit note.   Patient in for B12 12 injection per order from Dr. Frederik Pear. Given IM Right deltoid. Patient tolerated well.

## 2016-01-16 ENCOUNTER — Encounter: Payer: Medicare Other | Admitting: Family Medicine

## 2016-01-21 ENCOUNTER — Encounter: Payer: Self-pay | Admitting: Family Medicine

## 2016-01-22 ENCOUNTER — Telehealth: Payer: Self-pay

## 2016-01-22 MED ORDER — PROMETHAZINE-DM 6.25-15 MG/5ML PO SYRP: 5.0000 mL | ORAL_SOLUTION | Freq: Four times a day (QID) | ORAL | 0 refills | Status: DC | PRN

## 2016-01-22 NOTE — Telephone Encounter (Signed)
Patient requesting Promethazine for cough.  Please advise PC

## 2016-01-22 NOTE — Telephone Encounter (Signed)
She should be seen if symptoms persistr

## 2016-01-22 NOTE — Telephone Encounter (Signed)
Called her back- a week ago she was seen at [facility] for a prolonged cough.  She was told that her lungs were clear, her throat showed PND.  She was treated with a prednisone taper, azithromycin, flonase and phenergan DM.  She is still coughing and is not sure what she should do.  Also, she wonders if she should be seen.  Advised her that I will refill her cough syrup but otherwise yes she should be seen for a recheck this week if sx persist

## 2016-01-29 ENCOUNTER — Encounter: Payer: Self-pay | Admitting: Family Medicine

## 2016-01-29 ENCOUNTER — Ambulatory Visit (INDEPENDENT_AMBULATORY_CARE_PROVIDER_SITE_OTHER): Payer: Medicare Other | Admitting: Family Medicine

## 2016-01-29 VITALS — BP 116/78 | HR 91 | Temp 98.1°F | Ht 69.0 in | Wt 220.0 lb

## 2016-01-29 DIAGNOSIS — E210 Primary hyperparathyroidism: Secondary | ICD-10-CM | POA: Diagnosis not present

## 2016-01-29 DIAGNOSIS — R7309 Other abnormal glucose: Secondary | ICD-10-CM | POA: Diagnosis not present

## 2016-01-29 DIAGNOSIS — F329 Major depressive disorder, single episode, unspecified: Secondary | ICD-10-CM

## 2016-01-29 DIAGNOSIS — E782 Mixed hyperlipidemia: Secondary | ICD-10-CM

## 2016-01-29 DIAGNOSIS — F32A Depression, unspecified: Secondary | ICD-10-CM

## 2016-01-29 DIAGNOSIS — E559 Vitamin D deficiency, unspecified: Secondary | ICD-10-CM

## 2016-01-29 DIAGNOSIS — Z0000 Encounter for general adult medical examination without abnormal findings: Secondary | ICD-10-CM | POA: Diagnosis not present

## 2016-01-29 DIAGNOSIS — E538 Deficiency of other specified B group vitamins: Secondary | ICD-10-CM | POA: Diagnosis not present

## 2016-01-29 DIAGNOSIS — E039 Hypothyroidism, unspecified: Secondary | ICD-10-CM | POA: Diagnosis not present

## 2016-01-29 MED ORDER — RANITIDINE HCL 300 MG PO TABS: 300.0000 mg | ORAL_TABLET | Freq: Every evening | ORAL | 3 refills | Status: DC | PRN

## 2016-01-29 NOTE — Assessment & Plan Note (Signed)
Check Vitamin D level, not taking her supplements daily

## 2016-01-29 NOTE — Assessment & Plan Note (Signed)
Is struggling with crisis with her step daughter dying of anal cancer at age 66 years. She feels the Sertraline is helping her through

## 2016-01-29 NOTE — Assessment & Plan Note (Signed)
On Levothyroxine, continue to monitor 

## 2016-01-29 NOTE — Patient Instructions (Addendum)
Call your insurance and  Confirm they will pay for a hep C abx screen for preventative purposes per the CDC guidelines  Preventive Care 65 Years and Older, Female Preventive care refers to lifestyle choices and visits with your health care provider that can promote health and wellness. What does preventive care include?  A yearly physical exam. This is also called an annual well check.  Dental exams once or twice a year.  Routine eye exams. Ask your health care provider how often you should have your eyes checked.  Personal lifestyle choices, including:  Daily care of your teeth and gums.  Regular physical activity.  Eating a healthy diet.  Avoiding tobacco and drug use.  Limiting alcohol use.  Practicing safe sex.  Taking low-dose aspirin every day.  Taking vitamin and mineral supplements as recommended by your health care provider. What happens during an annual well check? The services and screenings done by your health care provider during your annual well check will depend on your age, overall health, lifestyle risk factors, and family history of disease. Counseling  Your health care provider may ask you questions about your:  Alcohol use.  Tobacco use.  Drug use.  Emotional well-being.  Home and relationship well-being.  Sexual activity.  Eating habits.  History of falls.  Memory and ability to understand (cognition).  Work and work Statistician.  Reproductive health. Screening  You may have the following tests or measurements:  Height, weight, and BMI.  Blood pressure.  Lipid and cholesterol levels. These may be checked every 5 years, or more frequently if you are over 83 years old.  Skin check.  Lung cancer screening. You may have this screening every year starting at age 54 if you have a 30-pack-year history of smoking and currently smoke or have quit within the past 15 years.  Fecal occult blood test (FOBT) of the stool. You may have this  test every year starting at age 3.  Flexible sigmoidoscopy or colonoscopy. You may have a sigmoidoscopy every 5 years or a colonoscopy every 10 years starting at age 43.  Hepatitis C blood test.  Hepatitis B blood test.  Sexually transmitted disease (STD) testing.  Diabetes screening. This is done by checking your blood sugar (glucose) after you have not eaten for a while (fasting). You may have this done every 1-3 years.  Bone density scan. This is done to screen for osteoporosis. You may have this done starting at age 35.  Mammogram. This may be done every 1-2 years. Talk to your health care provider about how often you should have regular mammograms. Talk with your health care provider about your test results, treatment options, and if necessary, the need for more tests. Vaccines  Your health care provider may recommend certain vaccines, such as:  Influenza vaccine. This is recommended every year.  Tetanus, diphtheria, and acellular pertussis (Tdap, Td) vaccine. You may need a Td booster every 10 years.  Varicella vaccine. You may need this if you have not been vaccinated.  Zoster vaccine. You may need this after age 57.  Measles, mumps, and rubella (MMR) vaccine. You may need at least one dose of MMR if you were born in 1957 or later. You may also need a second dose.  Pneumococcal 13-valent conjugate (PCV13) vaccine. One dose is recommended after age 53.  Pneumococcal polysaccharide (PPSV23) vaccine. One dose is recommended after age 54.  Meningococcal vaccine. You may need this if you have certain conditions.  Hepatitis A vaccine.  You may need this if you have certain conditions or if you travel or work in places where you may be exposed to hepatitis A.  Hepatitis B vaccine. You may need this if you have certain conditions or if you travel or work in places where you may be exposed to hepatitis B.  Haemophilus influenzae type b (Hib) vaccine. You may need this if you have  certain conditions. Talk to your health care provider about which screenings and vaccines you need and how often you need them. This information is not intended to replace advice given to you by your health care provider. Make sure you discuss any questions you have with your health care provider. Document Released: 03/10/2015 Document Revised: 11/01/2015 Document Reviewed: 12/13/2014 Elsevier Interactive Patient Education  2017 [employer].

## 2016-01-29 NOTE — Assessment & Plan Note (Signed)
Continues to take a monthly shot of Vitamin B12 took last roughly 2 weeks.

## 2016-01-29 NOTE — Assessment & Plan Note (Signed)
Encouraged heart healthy diet, increase exercise, avoid trans fats, consider a krill oil cap daily 

## 2016-01-29 NOTE — Progress Notes (Signed)
Pre visit review using our clinic review tool, if applicable. No additional management support is needed unless otherwise documented below in the visit note. 

## 2016-01-29 NOTE — Assessment & Plan Note (Signed)
hgba1c acceptable, minimize simple carbs. Increase exercise as tolerated.  

## 2016-01-29 NOTE — Assessment & Plan Note (Signed)
Check PTH today. Has chosen not to continue with endocrinology

## 2016-01-30 LAB — COMPREHENSIVE METABOLIC PANEL
ALK PHOS: 54 U/L (ref 39–117)
ALT: 17 U/L (ref 0–35)
AST: 16 U/L (ref 0–37)
Albumin: 4.1 g/dL (ref 3.5–5.2)
BUN: 14 mg/dL (ref 6–23)
CHLORIDE: 105 meq/L (ref 96–112)
CO2: 28 meq/L (ref 19–32)
Calcium: 10.2 mg/dL (ref 8.4–10.5)
Creatinine, Ser: 0.88 mg/dL (ref 0.40–1.20)
GFR: 68.33 mL/min (ref >60.00)
GLUCOSE: 96 mg/dL (ref 70–99)
POTASSIUM: 4.2 meq/L (ref 3.5–5.1)
SODIUM: 140 meq/L (ref 135–145)
TOTAL PROTEIN: 7.2 g/dL (ref 6.0–8.3)
Total Bilirubin: 0.8 mg/dL (ref 0.2–1.2)

## 2016-01-30 LAB — TSH: TSH: 0.95 u[IU]/mL (ref 0.35–4.50)

## 2016-01-30 LAB — CBC
HEMATOCRIT: 39.3 % (ref 36.0–46.0)
HEMOGLOBIN: 13.5 g/dL (ref 12.0–15.0)
MCHC: 34.3 g/dL (ref 30.0–36.0)
MCV: 91.5 fl (ref 78.0–100.0)
Platelets: 186 10*3/uL (ref 150.0–400.0)
RBC: 4.29 Mil/uL (ref 3.87–5.11)
RDW: 13.9 % (ref 11.5–15.5)
WBC: 8.1 10*3/uL (ref 4.0–10.5)

## 2016-01-30 LAB — LIPID PANEL
Cholesterol: 225 mg/dL — ABNORMAL HIGH (ref 0–200)
HDL: 57 mg/dL (ref >39.00)
LDL CALC: 145 mg/dL — AB (ref 0–99)
NONHDL: 167.88
Total CHOL/HDL Ratio: 4
Triglycerides: 115 mg/dL (ref 0.0–149.0)
VLDL: 23 mg/dL (ref 0.0–40.0)

## 2016-01-30 LAB — VITAMIN D 25 HYDROXY (VIT D DEFICIENCY, FRACTURES): VITD: 21.89 ng/mL — ABNORMAL LOW (ref 30.00–100.00)

## 2016-01-30 LAB — VITAMIN B12: Vitamin B-12: 345 pg/mL (ref 211–911)

## 2016-01-30 LAB — HEMOGLOBIN A1C: HEMOGLOBIN A1C: 5.8 % (ref 4.6–6.5)

## 2016-02-01 ENCOUNTER — Telehealth: Payer: Self-pay | Admitting: *Deleted

## 2016-02-01 ENCOUNTER — Other Ambulatory Visit: Payer: Self-pay | Admitting: *Deleted

## 2016-02-01 DIAGNOSIS — Z7289 Other problems related to lifestyle: Secondary | ICD-10-CM

## 2016-02-01 DIAGNOSIS — Z1159 Encounter for screening for other viral diseases: Secondary | ICD-10-CM

## 2016-02-01 MED ORDER — ZOSTER VACCINE LIVE 19400 UNT/0.65ML ~~LOC~~ SUSR
0.6500 mL | Freq: Once | SUBCUTANEOUS | 0 refills | Status: AC
Start: 2016-02-01 — End: 2016-02-01

## 2016-02-01 NOTE — Telephone Encounter (Signed)
Called pt to schedule AWV, she states she had her wellness at appt on 01/29/16. Verified w/ PCP, and wellness was completed (LOS and OV note are not completed).   Pt called her insurance company and they will cover Hep C screening. She requested to have Hep C screening done at nurse visit tomorrow as discussed w/ PCP. Future orders placed.   Pt also requests paper rx for shingles vaccine. Ok per Dr. Charlett Blake. Rx printed and filed at front desk for pick up.

## 2016-02-02 ENCOUNTER — Ambulatory Visit (INDEPENDENT_AMBULATORY_CARE_PROVIDER_SITE_OTHER): Payer: Medicare Other

## 2016-02-02 ENCOUNTER — Other Ambulatory Visit (INDEPENDENT_AMBULATORY_CARE_PROVIDER_SITE_OTHER): Payer: Medicare Other

## 2016-02-02 DIAGNOSIS — E538 Deficiency of other specified B group vitamins: Secondary | ICD-10-CM

## 2016-02-02 DIAGNOSIS — Z1159 Encounter for screening for other viral diseases: Secondary | ICD-10-CM

## 2016-02-02 DIAGNOSIS — Z7289 Other problems related to lifestyle: Secondary | ICD-10-CM

## 2016-02-02 LAB — HEPATITIS C ANTIBODY: HCV AB: NEGATIVE

## 2016-02-02 MED ORDER — CYANOCOBALAMIN 1000 MCG/ML IJ SOLN
1000.0000 ug | Freq: Once | INTRAMUSCULAR | Status: AC
  Administered 2016-02-02: 1000 ug via INTRAMUSCULAR

## 2016-02-02 NOTE — Progress Notes (Signed)
Pre visit review using our clinic tool,if applicable. No additional management support is needed unless otherwise documented below in the visit note.   Patient in for B12 injection. Given IM Right deltoid.

## 2016-02-05 ENCOUNTER — Other Ambulatory Visit: Payer: Self-pay | Admitting: Family Medicine

## 2016-02-05 DIAGNOSIS — E559 Vitamin D deficiency, unspecified: Secondary | ICD-10-CM

## 2016-02-05 DIAGNOSIS — E210 Primary hyperparathyroidism: Secondary | ICD-10-CM

## 2016-02-05 DIAGNOSIS — E538 Deficiency of other specified B group vitamins: Secondary | ICD-10-CM

## 2016-02-05 DIAGNOSIS — E039 Hypothyroidism, unspecified: Secondary | ICD-10-CM

## 2016-02-05 DIAGNOSIS — E785 Hyperlipidemia, unspecified: Secondary | ICD-10-CM

## 2016-02-11 NOTE — Assessment & Plan Note (Signed)
Patient denies any difficulties at home. No trouble with ADLs, depression or falls. See EMR for functional status screen and depression screen. No recent changes to vision or hearing. Is UTD with immunizations. Is UTD with screening. Discussed Advanced Directives. Encouraged heart healthy diet, exercise as tolerated and adequate sleep. See patient's problem list for health risk factors to monitor. See AVS for preventative healthcare recommendation schedule. 

## 2016-02-11 NOTE — Progress Notes (Signed)
Patient ID: Olivia Hodge, female   DOB: 1951/02/15, 65 y.o.   MRN: [id number]   Subjective:    Patient ID: Olivia Hodge, female    DOB: 1950/07/19, 66 y.o.   MRN: [id number]  Chief Complaint  Patient presents with  . Medicare Wellness    R hip discomfort x10 months. Some difficulty swallowing x6 weeks.    HPI Patient is in today for annual wellness exam and follow up on medical concerns. She feels well today although she does endorse ongoing left hip pian. No recent fall or injury. No radicular symptoms. Also notes some recent trouble with postnasal drip and dysphagia over past few weeks. No choking or nausea and vomting. Is doing well with activities of daily living. Denies CP/palp/SOB/HA/congestion/fevers/GI or GU c/o. Taking meds as prescribed  Past Medical History:  Diagnosis Date  . Allergy   . B12 deficiency   . History of chicken pox   . Hyperlipidemia   . Thyroid disease   . Vitamin D deficiency 05/11/2015    Past Surgical History:  Procedure Laterality Date  . APPENDECTOMY      Family History  Problem Relation Age of Onset  . Adopted: Yes  . COPD Daughter   . Birth defects Daughter     recurrent bronchitis  . ADD / ADHD Son     Social History   Social History  . Marital status: Divorced    Spouse name: N/A  . Number of children: N/A  . Years of education: N/A   Occupational History  . Not on file.   Social History Main Topics  . Smoking status: Former Smoker    Quit date: 02/26/1992  . Smokeless tobacco: Not on file  . Alcohol use Yes  . Drug use: No  . 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 Not on file   Social History Narrative  . No narrative on file    Outpatient Medications Prior to Visit  Medication Sig Dispense Refill  . ezetimibe (ZETIA) 10 MG tablet Take 1 tablet (10 mg total) by mouth daily. 90 tablet 2  .  levothyroxine (SYNTHROID, LEVOTHROID) 175 MCG tablet TAKE 1 TABLET DAILY BEFORE BREAKFAST 90 tablet 1  . loratadine (CLARITIN) 10 MG tablet Take 10 mg by mouth daily.    . NON FORMULARY Doterra supplements    . promethazine-dextromethorphan (PROMETHAZINE-DM) 6.25-15 MG/5ML syrup Take 5 mLs by mouth 4 (four) times daily as needed for cough. 118 mL 0  . sertraline (ZOLOFT) 100 MG tablet Take 1 tablet (100 mg total) by mouth daily. 90 tablet 2  . fluconazole (DIFLUCAN) 50 MG tablet Take 1 tablet by mouth once (Patient not taking: Reported on 01/29/2016) 1 tablet 0   No facility-administered medications prior to visit.     Allergies  Allergen Reactions  . Penicillins Hives  . Tetracyclines & Related Nausea And Vomiting  . Erythromycin Nausea Only    Makes patient really sick    Review of Systems  Constitutional: Negative for chills, fever and malaise/fatigue.  HENT: Negative for congestion and hearing loss.   Eyes: Negative for discharge.  Respiratory: Positive for sputum production. Negative for cough and shortness of breath.   Cardiovascular: Negative for chest pain, palpitations and leg swelling.  Gastrointestinal: Negative for abdominal pain, blood in stool, constipation, diarrhea, heartburn, nausea and vomiting.  Genitourinary: Negative for dysuria, frequency,  hematuria and urgency.  Musculoskeletal: Negative for back pain, falls and myalgias.  Skin: Negative for rash.  Neurological: Negative for dizziness, sensory change, loss of consciousness, weakness and headaches.  Endo/Heme/Allergies: Negative for environmental allergies. Does not bruise/bleed easily.  Psychiatric/Behavioral: Negative for depression and suicidal ideas. The patient is not nervous/anxious and does not have insomnia.        Objective:    Physical Exam  Constitutional: She is oriented to person, place, and time. She appears well-developed and well-nourished. No distress.  HENT:  Head: Normocephalic and  atraumatic.  Eyes: Conjunctivae are normal.  Neck: Neck supple. No thyromegaly present.  Cardiovascular: Normal rate, regular rhythm and normal heart sounds.   No murmur heard. Pulmonary/Chest: Effort normal and breath sounds normal. No respiratory distress.  Abdominal: Soft. Bowel sounds are normal. She exhibits no distension and no mass. There is no tenderness.  Musculoskeletal: She exhibits no edema.  Lymphadenopathy:    She has no cervical adenopathy.  Neurological: She is alert and oriented to person, place, and time.  Skin: Skin is warm and dry.  Psychiatric: She has a normal mood and affect. Her behavior is normal.    BP 116/78 (BP Location: Left Arm, Patient Position: Sitting, Cuff Size: Large)   Pulse 91   Temp 98.1 F (36.7 C) (Oral)   Ht 5\' 9"  (1.753 m)   Wt 220 lb (99.8 kg)   SpO2 95% Comment: RA  BMI 32.49 kg/m  Wt Readings from Last 3 Encounters:  01/29/16 220 lb (99.8 kg)  05/11/15 221 lb 2 oz (100.3 kg)  12/22/14 220 lb 6 oz (100 kg)     Lab Results  Component Value Date   WBC 8.1 01/29/2016   HGB 13.5 01/29/2016   HCT 39.3 01/29/2016   PLT 186.0 01/29/2016   GLUCOSE 96 01/29/2016   CHOL 225 (H) 01/29/2016   TRIG 115.0 01/29/2016   HDL 57.00 01/29/2016   LDLDIRECT 205.0 05/25/2014   LDLCALC 145 (H) 01/29/2016   ALT 17 01/29/2016   AST 16 01/29/2016   NA 140 01/29/2016   K 4.2 01/29/2016   CL 105 01/29/2016   CREATININE 0.88 01/29/2016   BUN 14 01/29/2016   CO2 28 01/29/2016   TSH 0.95 01/29/2016   HGBA1C 5.8 01/29/2016    Lab Results  Component Value Date   TSH 0.95 01/29/2016   Lab Results  Component Value Date   WBC 8.1 01/29/2016   HGB 13.5 01/29/2016   HCT 39.3 01/29/2016   MCV 91.5 01/29/2016   PLT 186.0 01/29/2016   Lab Results  Component Value Date   NA 140 01/29/2016   K 4.2 01/29/2016   CO2 28 01/29/2016   GLUCOSE 96 01/29/2016   BUN 14 01/29/2016   CREATININE 0.88 01/29/2016   BILITOT 0.8 01/29/2016   ALKPHOS 54  01/29/2016   AST 16 01/29/2016   ALT 17 01/29/2016   PROT 7.2 01/29/2016   ALBUMIN 4.1 01/29/2016   CALCIUM 10.2 01/29/2016   ANIONGAP 8 02/16/2014   GFR 68.33 01/29/2016   Lab Results  Component Value Date   CHOL 225 (H) 01/29/2016   Lab Results  Component Value Date   HDL 57.00 01/29/2016   Lab Results  Component Value Date   LDLCALC 145 (H) 01/29/2016   Lab Results  Component Value Date   TRIG 115.0 01/29/2016   Lab Results  Component Value Date   CHOLHDL 4 01/29/2016   Lab Results  Component Value Date   HGBA1C  5.8 01/29/2016       Assessment & Plan:   Problem List Items Addressed This Visit    Hypothyroidism    On Levothyroxine, continue to monitor      Relevant Orders   CBC (Completed)   TSH (Completed)   Depression    Is struggling with crisis with her step daughter dying of anal cancer at age 50 years. She feels the Sertraline is helping her through      Hyperlipidemia    Encouraged heart healthy diet, increase exercise, avoid trans fats, consider a krill oil cap daily      Relevant Orders   CBC (Completed)   Lipid panel (Completed)   Elevated glucose    hgba1c acceptable, minimize simple carbs. Increase exercise as tolerated.      Relevant Orders   CBC (Completed)   Comprehensive metabolic panel (Completed)   TSH (Completed)   Hemoglobin A1c (Completed)   Medicare annual wellness visit, subsequent    Patient denies any difficulties at home. No trouble with ADLs, depression or falls. See EMR for functional status screen and depression screen. No recent changes to vision or hearing. Is UTD with immunizations. Is UTD with screening. Discussed Advanced Directives. Encouraged heart healthy diet, exercise as tolerated and adequate sleep. See patient's problem list for health risk factors to monitor. See AVS for preventative healthcare recommendation schedule.      B12 deficiency    Continues to take a monthly shot of Vitamin B12 took last  roughly 2 weeks.       Relevant Orders   CBC (Completed)   Vitamin B12 (Completed)   Hyperparathyroidism, primary ([place])    Check PTH today. Has chosen not to continue with endocrinology      Relevant Orders   TSH (Completed)   Vitamin D deficiency    Check Vitamin D level, not taking her supplements daily      Relevant Orders   VITAMIN D 25 Hydroxy (Vit-D Deficiency, Fractures) (Completed)      I have discontinued Ms. Eichel's fluconazole. I am also having her start on ranitidine. Additionally, I am having her maintain her loratadine, NON FORMULARY, ezetimibe, levothyroxine, promethazine-dextromethorphan, and Probiotic Product (PROBIOTIC DAILY PO).  Meds ordered this encounter  Medications  . Probiotic Product (PROBIOTIC DAILY PO)    Sig: Take 1 tablet by mouth daily.  . ranitidine (ZANTAC) 300 MG tablet    Sig: Take 1 tablet (300 mg total) by mouth at bedtime as needed for heartburn.    Dispense:  90 tablet    Refill:  3     Penni Homans, MD

## 2016-02-26 HISTORY — PX: EYE SURGERY: SHX253

## 2016-03-02 ENCOUNTER — Other Ambulatory Visit: Payer: Self-pay | Admitting: Family Medicine

## 2016-03-06 ENCOUNTER — Ambulatory Visit: Payer: Medicare Other

## 2016-03-08 ENCOUNTER — Ambulatory Visit: Payer: Medicare Other

## 2016-03-10 ENCOUNTER — Other Ambulatory Visit: Payer: Self-pay | Admitting: Family Medicine

## 2016-03-10 DIAGNOSIS — E039 Hypothyroidism, unspecified: Secondary | ICD-10-CM

## 2016-03-10 DIAGNOSIS — E559 Vitamin D deficiency, unspecified: Secondary | ICD-10-CM

## 2016-03-10 DIAGNOSIS — E785 Hyperlipidemia, unspecified: Secondary | ICD-10-CM

## 2016-03-10 DIAGNOSIS — E210 Primary hyperparathyroidism: Secondary | ICD-10-CM

## 2016-03-10 DIAGNOSIS — E538 Deficiency of other specified B group vitamins: Secondary | ICD-10-CM

## 2016-03-12 ENCOUNTER — Ambulatory Visit (INDEPENDENT_AMBULATORY_CARE_PROVIDER_SITE_OTHER): Payer: Medicare Other | Admitting: Behavioral Health

## 2016-03-12 DIAGNOSIS — E538 Deficiency of other specified B group vitamins: Secondary | ICD-10-CM

## 2016-03-12 MED ORDER — CYANOCOBALAMIN 1000 MCG/ML IJ SOLN
1000.0000 ug | Freq: Once | INTRAMUSCULAR | Status: AC
  Administered 2016-03-12: 1000 ug via INTRAMUSCULAR

## 2016-03-12 NOTE — Progress Notes (Signed)
Pre visit review using our clinic review tool, if applicable. No additional management support is needed unless otherwise documented below in the visit note.  Patient came in clinic for B12 injection. IM given in Left Deltoid. Patient tolerated injection well.  Next appointment scheduled for 04/19/16 at 9:30 AM.

## 2016-04-07 ENCOUNTER — Encounter: Payer: Self-pay | Admitting: Family Medicine

## 2016-04-08 ENCOUNTER — Other Ambulatory Visit: Payer: Self-pay | Admitting: Family Medicine

## 2016-04-08 MED ORDER — RANITIDINE HCL 300 MG PO TABS: 300.0000 mg | ORAL_TABLET | Freq: Every evening | ORAL | 3 refills | Status: DC | PRN

## 2016-04-18 ENCOUNTER — Ambulatory Visit: Payer: Medicare Other

## 2016-04-18 ENCOUNTER — Ambulatory Visit (INDEPENDENT_AMBULATORY_CARE_PROVIDER_SITE_OTHER): Payer: Medicare Other | Admitting: Behavioral Health

## 2016-04-18 DIAGNOSIS — E538 Deficiency of other specified B group vitamins: Secondary | ICD-10-CM

## 2016-04-18 MED ORDER — CYANOCOBALAMIN 1000 MCG/ML IJ SOLN
1000.0000 ug | Freq: Once | INTRAMUSCULAR | Status: AC
  Administered 2016-04-18: 1000 ug via INTRAMUSCULAR

## 2016-04-18 NOTE — Progress Notes (Signed)
Pre visit review using our clinic review tool, if applicable. No additional management support is needed unless otherwise documented below in the visit note.   Pt came in for b12 injection and tolerated  it well. Next appointment 05/16/2016

## 2016-04-19 ENCOUNTER — Ambulatory Visit: Payer: Medicare Other

## 2016-05-14 ENCOUNTER — Ambulatory Visit (INDEPENDENT_AMBULATORY_CARE_PROVIDER_SITE_OTHER): Payer: Medicare Other | Admitting: Behavioral Health

## 2016-05-14 DIAGNOSIS — E538 Deficiency of other specified B group vitamins: Secondary | ICD-10-CM

## 2016-05-14 MED ORDER — CYANOCOBALAMIN 1000 MCG/ML IJ SOLN
1000.0000 ug | Freq: Once | INTRAMUSCULAR | Status: AC
  Administered 2016-05-14: 1000 ug via INTRAMUSCULAR

## 2016-05-14 NOTE — Progress Notes (Signed)
Pre visit review using our clinic review tool, if applicable. No additional management support is needed unless otherwise documented below in the visit note.   Patient came in clinic today for B12 injection. IM given in Right Deltoid. Patient tolerated injection well.  Next appointment scheduled for 06/13/16 at 9:45 AM.

## 2016-05-16 ENCOUNTER — Ambulatory Visit: Payer: Medicare Other

## 2016-05-27 ENCOUNTER — Encounter: Payer: Self-pay | Admitting: Family Medicine

## 2016-05-31 ENCOUNTER — Encounter: Payer: Self-pay | Admitting: Family Medicine

## 2016-06-13 ENCOUNTER — Ambulatory Visit: Payer: Medicare Other

## 2016-06-19 ENCOUNTER — Ambulatory Visit (INDEPENDENT_AMBULATORY_CARE_PROVIDER_SITE_OTHER): Payer: Medicare Other

## 2016-06-19 DIAGNOSIS — E538 Deficiency of other specified B group vitamins: Secondary | ICD-10-CM

## 2016-06-19 MED ORDER — CYANOCOBALAMIN 1000 MCG/ML IJ SOLN
1000.0000 ug | Freq: Once | INTRAMUSCULAR | Status: AC
  Administered 2016-06-19: 1000 ug via INTRAMUSCULAR

## 2016-06-19 NOTE — Progress Notes (Signed)
Pre visit review using our clinic tool,if applicable. No additional management support is needed unless otherwise documented below in the visit note.   Patient in for B12 injection per order from Dr. Penni Homans due to B12 deficiency.  Patients states all medications are the same and she has developed no new allergies.   B12 1000 mcg given IM left deltoid. Patient tolerated well. Return appointment scheduled for Jul 17, 2016  9:30 am. Patient notified.

## 2016-06-25 DIAGNOSIS — E538 Deficiency of other specified B group vitamins: Secondary | ICD-10-CM | POA: Diagnosis not present

## 2016-06-26 MED ORDER — CYANOCOBALAMIN 1000 MCG/ML IJ SOLN
1000.0000 ug | Freq: Once | INTRAMUSCULAR | Status: AC
  Administered 2016-06-25: 1000 ug via INTRAMUSCULAR

## 2016-07-17 ENCOUNTER — Ambulatory Visit (INDEPENDENT_AMBULATORY_CARE_PROVIDER_SITE_OTHER): Payer: Medicare Other

## 2016-07-17 DIAGNOSIS — E538 Deficiency of other specified B group vitamins: Secondary | ICD-10-CM

## 2016-07-17 MED ORDER — CYANOCOBALAMIN 1000 MCG/ML IJ SOLN
1000.0000 ug | Freq: Once | INTRAMUSCULAR | Status: AC
  Administered 2016-07-17: 1000 ug via INTRAMUSCULAR

## 2016-07-17 NOTE — Progress Notes (Signed)
Patient in for B12 injection per order from Dr. Frederik Pear due to patient having vitamin B12 deficiency.  No complaints voiced today. Given 1000 mcg  IM left deltoid.  Return appointment scheduled for 1 month. Patient notified.     On review administration fo b12 appropiate.  Saguier, Percell Miller, PA-C

## 2016-08-12 ENCOUNTER — Encounter: Payer: Self-pay | Admitting: Family Medicine

## 2016-08-12 ENCOUNTER — Ambulatory Visit (INDEPENDENT_AMBULATORY_CARE_PROVIDER_SITE_OTHER): Payer: Medicare Other | Admitting: Family Medicine

## 2016-08-12 DIAGNOSIS — E669 Obesity, unspecified: Secondary | ICD-10-CM | POA: Diagnosis not present

## 2016-08-12 DIAGNOSIS — E538 Deficiency of other specified B group vitamins: Secondary | ICD-10-CM | POA: Diagnosis not present

## 2016-08-12 DIAGNOSIS — R7309 Other abnormal glucose: Secondary | ICD-10-CM

## 2016-08-12 DIAGNOSIS — T7840XD Allergy, unspecified, subsequent encounter: Secondary | ICD-10-CM

## 2016-08-12 DIAGNOSIS — T7840XA Allergy, unspecified, initial encounter: Secondary | ICD-10-CM

## 2016-08-12 DIAGNOSIS — E559 Vitamin D deficiency, unspecified: Secondary | ICD-10-CM | POA: Diagnosis not present

## 2016-08-12 DIAGNOSIS — E782 Mixed hyperlipidemia: Secondary | ICD-10-CM

## 2016-08-12 DIAGNOSIS — E039 Hypothyroidism, unspecified: Secondary | ICD-10-CM | POA: Diagnosis not present

## 2016-08-12 HISTORY — DX: Allergy, unspecified, initial encounter: T78.40XA

## 2016-08-12 MED ORDER — MONTELUKAST SODIUM 10 MG PO TABS: 10.0000 mg | ORAL_TABLET | Freq: Every evening | ORAL | 3 refills | Status: DC | PRN

## 2016-08-12 MED ORDER — AZELASTINE HCL 0.1 % NA SOLN: 2.0000 | Freq: Two times a day (BID) | NASAL | 3 refills | Status: DC

## 2016-08-12 NOTE — Assessment & Plan Note (Signed)
Encouraged heart healthy diet, increase exercise, avoid trans fats, consider a krill oil cap daily 

## 2016-08-12 NOTE — Assessment & Plan Note (Signed)
Encouraged DASH diet, decrease po intake and increase exercise as tolerated. Needs 7-8 hours of sleep nightly. Avoid trans fats, eat small, frequent meals every 4-5 hours with lean proteins, complex carbs and healthy fats. Minimize simple carbs, bariatric referral 

## 2016-08-12 NOTE — Assessment & Plan Note (Signed)
On Levothyroxine, continue to monitor 

## 2016-08-12 NOTE — Assessment & Plan Note (Signed)
Check level today 

## 2016-08-12 NOTE — Assessment & Plan Note (Signed)
Given shot and check level today

## 2016-08-12 NOTE — Assessment & Plan Note (Signed)
Continue Claritin bid, flonase daily, add Astelin bid and consider adding Singulair daily.

## 2016-08-12 NOTE — Patient Instructions (Signed)

## 2016-08-12 NOTE — Assessment & Plan Note (Signed)
hgba1c acceptable, minimize simple carbs. Increase exercise as tolerated.  

## 2016-08-12 NOTE — Progress Notes (Signed)
Patient ID: Olivia Hodge, female   DOB: 08/03/1950, 66 y.o.   MRN: [id number]   Subjective:    Patient ID: Olivia Hodge, female    DOB: [birth year], 66 y.o.   MRN: [id number]  No chief complaint on file.   HPI Patient is in today for Follow-up and is struggling with significant allergic symptoms. She is increase Claritin to twice a day and uses Flonase intermittently but still has significant nasal congestion and pruritus as well as watery itchy eyes. No fevers or chills. She has had no other recent illness and denies any recent hospitalizations. Denies CP/palp/SOB/HA/fevers/GI or GU c/o. Taking meds as prescribed  Past Medical History:  Diagnosis Date  . Allergic state 08/12/2016  . Allergy   . B12 deficiency   . History of chicken pox   . Hyperlipidemia   . Thyroid disease   . Vitamin D deficiency 05/11/2015    Past Surgical History:  Procedure Laterality Date  . APPENDECTOMY      Family History  Problem Relation Age of Onset  . Adopted: Yes  . COPD Daughter   . Birth defects Daughter        recurrent bronchitis  . ADD / ADHD Son     Social History   Social History  . Marital status: Divorced    Spouse name: N/A  . Number of children: N/A  . Years of education: N/A   Occupational History  . Not on file.   Social History Main Topics  . Smoking status: Former Smoker    Quit date: 02/26/1992  . Smokeless tobacco: Never Used  . Alcohol use Yes  . Drug use: No  . 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 Not on file   Social History Narrative  . No narrative on file    Outpatient Medications Prior to Visit  Medication Sig Dispense Refill  . ezetimibe (ZETIA) 10 MG tablet TAKE 1 TABLET DAILY 90 tablet 2  . levothyroxine (SYNTHROID, LEVOTHROID) 175 MCG tablet TAKE 1 TABLET DAILY BEFORE BREAKFAST 90 tablet 1  . loratadine (CLARITIN) 10 MG  tablet Take 10 mg by mouth daily.    . NON FORMULARY Doterra supplements    . Probiotic Product (PROBIOTIC DAILY PO) Take 1 tablet by mouth daily.    . promethazine-dextromethorphan (PROMETHAZINE-DM) 6.25-15 MG/5ML syrup Take 5 mLs by mouth 4 (four) times daily as needed for cough. 118 mL 0  . ranitidine (ZANTAC) 300 MG tablet Take 1 tablet (300 mg total) by mouth at bedtime as needed for heartburn. 90 tablet 3  . sertraline (ZOLOFT) 100 MG tablet TAKE 1 TABLET DAILY 90 tablet 2   No facility-administered medications prior to visit.     Allergies  Allergen Reactions  . Penicillins Hives  . Tetracyclines & Related Nausea And Vomiting  . Erythromycin Nausea Only    Makes patient really sick    Review of Systems  Constitutional: Negative for fever and malaise/fatigue.  HENT: Positive for congestion.   Eyes: Positive for discharge. Negative for blurred vision, photophobia and pain.  Respiratory: Negative for shortness of breath.   Cardiovascular: Negative for chest pain, palpitations and leg swelling.  Gastrointestinal: Negative for abdominal pain, blood in stool and nausea.  Genitourinary: Negative for dysuria and frequency.  Musculoskeletal: Negative for falls.  Skin: Negative for rash.  Neurological: Negative for dizziness, loss of consciousness  and headaches.  Endo/Heme/Allergies: Negative for environmental allergies.  Psychiatric/Behavioral: Negative for depression. The patient is not nervous/anxious.        Objective:    Physical Exam  BP 118/74 (BP Location: Left Arm, Patient Position: Sitting, Cuff Size: Large)   Pulse 88   Temp 99.2 F (37.3 C) (Oral)   Resp 14   Ht 5\' 9"  (1.753 m)   Wt 225 lb 9.6 oz (102.3 kg)   SpO2 94%   BMI 33.32 kg/m  Wt Readings from Last 3 Encounters:  08/12/16 225 lb 9.6 oz (102.3 kg)  01/29/16 220 lb (99.8 kg)  05/11/15 221 lb 2 oz (100.3 kg)     Lab Results  Component Value Date   WBC 8.1 01/29/2016   HGB 13.5 01/29/2016   HCT  39.3 01/29/2016   PLT 186.0 01/29/2016   GLUCOSE 96 01/29/2016   CHOL 225 (H) 01/29/2016   TRIG 115.0 01/29/2016   HDL 57.00 01/29/2016   LDLDIRECT 205.0 05/25/2014   LDLCALC 145 (H) 01/29/2016   ALT 17 01/29/2016   AST 16 01/29/2016   NA 140 01/29/2016   K 4.2 01/29/2016   CL 105 01/29/2016   CREATININE 0.88 01/29/2016   BUN 14 01/29/2016   CO2 28 01/29/2016   TSH 0.95 01/29/2016   HGBA1C 5.8 01/29/2016    Lab Results  Component Value Date   TSH 0.95 01/29/2016   Lab Results  Component Value Date   WBC 8.1 01/29/2016   HGB 13.5 01/29/2016   HCT 39.3 01/29/2016   MCV 91.5 01/29/2016   PLT 186.0 01/29/2016   Lab Results  Component Value Date   NA 140 01/29/2016   K 4.2 01/29/2016   CO2 28 01/29/2016   GLUCOSE 96 01/29/2016   BUN 14 01/29/2016   CREATININE 0.88 01/29/2016   BILITOT 0.8 01/29/2016   ALKPHOS 54 01/29/2016   AST 16 01/29/2016   ALT 17 01/29/2016   PROT 7.2 01/29/2016   ALBUMIN 4.1 01/29/2016   CALCIUM 10.2 01/29/2016   ANIONGAP 8 02/16/2014   GFR 68.33 01/29/2016   Lab Results  Component Value Date   CHOL 225 (H) 01/29/2016   Lab Results  Component Value Date   HDL 57.00 01/29/2016   Lab Results  Component Value Date   LDLCALC 145 (H) 01/29/2016   Lab Results  Component Value Date   TRIG 115.0 01/29/2016   Lab Results  Component Value Date   CHOLHDL 4 01/29/2016   Lab Results  Component Value Date   HGBA1C 5.8 01/29/2016       Assessment & Plan:   Problem List Items Addressed This Visit    Hypothyroidism    On Levothyroxine, continue to monitor      Obesity (BMI 30-39.9)    Encouraged DASH diet, decrease po intake and increase exercise as tolerated. Needs 7-8 hours of sleep nightly. Avoid trans fats, eat small, frequent meals every 4-5 hours with lean proteins, complex carbs and healthy fats. Minimize simple carbs, bariatric referral      Hyperlipidemia    Encouraged heart healthy diet, increase exercise, avoid trans  fats, consider a krill oil cap daily      Relevant Orders   Lipid panel   TSH   Elevated glucose    hgba1c acceptable, minimize simple carbs. Increase exercise as tolerated.       B12 deficiency    Given shot and check level today      Relevant Orders   CBC  Vitamin B12   Vitamin D deficiency    Check level today      Relevant Orders   Hemoglobin A1c   Comprehensive metabolic panel   VITAMIN D 25 Hydroxy (Vit-D Deficiency, Fractures)   Allergic state    Continue Claritin bid, flonase daily, add Astelin bid and consider adding Singulair daily.          I am having Ms. Seyler start on azelastine and montelukast. I am also having her maintain her loratadine, NON FORMULARY, promethazine-dextromethorphan, Probiotic Product (PROBIOTIC DAILY PO), sertraline, levothyroxine, ezetimibe, and ranitidine.  Meds ordered this encounter  Medications  . azelastine (ASTELIN) 0.1 % nasal spray    Sig: Place 2 sprays into both nostrils 2 (two) times daily. Use in each nostril as directed    Dispense:  30 mL    Refill:  3  . montelukast (SINGULAIR) 10 MG tablet    Sig: Take 1 tablet (10 mg total) by mouth at bedtime as needed.    Dispense:  30 tablet    Refill:  3    Penni Homans, MD

## 2016-08-13 LAB — LIPID PANEL
Cholesterol: 226 mg/dL — ABNORMAL HIGH (ref 0–200)
HDL: 51.1 mg/dL (ref >39.00)
NONHDL: 174.44
Total CHOL/HDL Ratio: 4
Triglycerides: 317 mg/dL — ABNORMAL HIGH (ref 0.0–149.0)
VLDL: 63.4 mg/dL — ABNORMAL HIGH (ref 0.0–40.0)

## 2016-08-13 LAB — COMPREHENSIVE METABOLIC PANEL
ALT: 14 U/L (ref 0–35)
AST: 17 U/L (ref 0–37)
Albumin: 4.4 g/dL (ref 3.5–5.2)
Alkaline Phosphatase: 57 U/L (ref 39–117)
BILIRUBIN TOTAL: 0.4 mg/dL (ref 0.2–1.2)
BUN: 17 mg/dL (ref 6–23)
CO2: 30 meq/L (ref 19–32)
CREATININE: 0.91 mg/dL (ref 0.40–1.20)
Calcium: 10.9 mg/dL — ABNORMAL HIGH (ref 8.4–10.5)
Chloride: 104 mEq/L (ref 96–112)
GFR: 65.63 mL/min (ref >60.00)
GLUCOSE: 102 mg/dL — AB (ref 70–99)
Potassium: 4.4 mEq/L (ref 3.5–5.1)
SODIUM: 139 meq/L (ref 135–145)
Total Protein: 6.9 g/dL (ref 6.0–8.3)

## 2016-08-13 LAB — CBC
HCT: 43.2 % (ref 36.0–46.0)
Hemoglobin: 14.4 g/dL (ref 12.0–15.0)
MCHC: 33.4 g/dL (ref 30.0–36.0)
MCV: 94.5 fl (ref 78.0–100.0)
PLATELETS: 189 10*3/uL (ref 150.0–400.0)
RBC: 4.57 Mil/uL (ref 3.87–5.11)
RDW: 13.6 % (ref 11.5–15.5)
WBC: 5.9 10*3/uL (ref 4.0–10.5)

## 2016-08-13 LAB — TSH: TSH: 1.57 u[IU]/mL (ref 0.35–4.50)

## 2016-08-13 LAB — HEMOGLOBIN A1C: HEMOGLOBIN A1C: 6 % (ref 4.6–6.5)

## 2016-08-13 LAB — LDL CHOLESTEROL, DIRECT: LDL DIRECT: 140 mg/dL

## 2016-08-13 LAB — VITAMIN D 25 HYDROXY (VIT D DEFICIENCY, FRACTURES): VITD: 17.95 ng/mL — ABNORMAL LOW (ref 30.00–100.00)

## 2016-08-13 LAB — VITAMIN B12: VITAMIN B 12: 309 pg/mL (ref 211–911)

## 2016-08-14 ENCOUNTER — Ambulatory Visit: Payer: Medicare Other

## 2016-08-14 ENCOUNTER — Encounter: Payer: Self-pay | Admitting: Family Medicine

## 2016-08-15 ENCOUNTER — Other Ambulatory Visit: Payer: Self-pay | Admitting: Family Medicine

## 2016-08-15 DIAGNOSIS — E559 Vitamin D deficiency, unspecified: Secondary | ICD-10-CM

## 2016-08-15 MED ORDER — VITAMIN D (ERGOCALCIFEROL) 1.25 MG (50000 UNIT) PO CAPS: 50000.0000 [IU] | ORAL_CAPSULE | ORAL | 4 refills | Status: DC

## 2016-08-29 ENCOUNTER — Other Ambulatory Visit: Payer: Self-pay | Admitting: Family Medicine

## 2016-09-11 ENCOUNTER — Ambulatory Visit: Payer: Medicare Other

## 2016-09-11 ENCOUNTER — Ambulatory Visit (INDEPENDENT_AMBULATORY_CARE_PROVIDER_SITE_OTHER): Payer: Medicare Other

## 2016-09-11 DIAGNOSIS — E538 Deficiency of other specified B group vitamins: Secondary | ICD-10-CM | POA: Diagnosis not present

## 2016-09-11 MED ORDER — CYANOCOBALAMIN 1000 MCG/ML IJ SOLN
1000.0000 ug | Freq: Once | INTRAMUSCULAR | Status: AC
  Administered 2016-09-11: 1000 ug via INTRAMUSCULAR

## 2016-09-11 NOTE — Progress Notes (Signed)
Pre visit review using our clinic tool,if applicable. No additional management support is needed unless otherwise documented below in the visit note.   Patient in for B12 injection per order from Dr. Frederik Pear due to patietiet being B12 deficient.  Given 1000 mcg IM left deltoid. Patient tolerated well. Return appointment given for 1 month.

## 2016-09-12 ENCOUNTER — Encounter: Payer: Self-pay | Admitting: Medical

## 2016-09-12 ENCOUNTER — Ambulatory Visit (INDEPENDENT_AMBULATORY_CARE_PROVIDER_SITE_OTHER): Payer: Medicare Other | Admitting: Medical

## 2016-09-12 VITALS — BP 122/80 | HR 85 | Temp 98.8°F | Resp 16

## 2016-09-12 DIAGNOSIS — H00015 Hordeolum externum left lower eyelid: Secondary | ICD-10-CM

## 2016-09-12 DIAGNOSIS — H538 Other visual disturbances: Secondary | ICD-10-CM

## 2016-09-12 MED ORDER — TOBRAMYCIN 0.3 % OP SOLN: 2.0000 [drp] | Freq: Four times a day (QID) | OPHTHALMIC | 0 refills | Status: DC

## 2016-09-12 NOTE — Progress Notes (Signed)
Subjective:    Patient ID: Olivia Hodge, female    DOB: 1951/02/12, 66 y.o.   MRN: [id number]  HPI  Pt in with some redness to her left eye on saturday. Acute mild blurred but getting better gradually each day(she explains actually eye feels water at times when has blurred vision). No eye pressure, no trauma, no light flashing, blacked out portion of fields, no cloudy vision, and no new floaters.  Mild tenderness to lower lid.  Pt wears glasses and vision without is terrible.  Pt has specialist(opthalmologist) Dr. Katy Apo.  Last high eyes checked about 2 years ago.     Review of Systems  Constitutional: Negative for chills and fatigue.  Eyes: Positive for redness. Negative for photophobia, discharge and itching.       Watery and blurry vision at times left eye.  Respiratory: Negative for cough, chest tightness, shortness of breath and wheezing.   Cardiovascular: Negative for chest pain and palpitations.  Gastrointestinal: Negative for abdominal pain.  Neurological: Negative for dizziness, syncope, speech difficulty, weakness, numbness and headaches.  Hematological: Negative for adenopathy. Does not bruise/bleed easily.  Psychiatric/Behavioral: Negative for behavioral problems and confusion.   Past Medical History:  Diagnosis Date  . Allergic state 08/12/2016  . Allergy   . B12 deficiency   . History of chicken pox   . Hyperlipidemia   . Thyroid disease   . Vitamin D deficiency 05/11/2015     Social History   Social History  . Marital status: Divorced    Spouse name: N/A  . Number of children: N/A  . Years of education: N/A   Occupational History  . Not on file.   Social History Main Topics  . Smoking status: Former Smoker    Quit date: 02/26/1992  . Smokeless tobacco: Never Used  . Alcohol use Yes  . Drug use: No  . Sexual activity: Not on file     Comment: lives with husband, 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, No major dietary restrictions    Other Topics Concern  . Not on file   Social History Narrative  . No narrative on file    Past Surgical History:  Procedure Laterality Date  . APPENDECTOMY      Family History  Problem Relation Age of Onset  . Adopted: Yes  . COPD Daughter   . Birth defects Daughter        recurrent bronchitis  . ADD / ADHD Son     Allergies  Allergen Reactions  . Penicillins Hives  . Tetracyclines & Related Nausea And Vomiting  . Erythromycin Nausea Only    Makes patient really sick    Current Outpatient Prescriptions on File Prior to Visit  Medication Sig Dispense Refill  . azelastine (ASTELIN) 0.1 % nasal spray Place 2 sprays into both nostrils 2 (two) times daily. Use in each nostril as directed 30 mL 3  . ezetimibe (ZETIA) 10 MG tablet TAKE 1 TABLET DAILY 90 tablet 2  . levothyroxine (SYNTHROID, LEVOTHROID) 175 MCG tablet TAKE 1 TABLET DAILY BEFORE BREAKFAST 90 tablet 1  . loratadine (CLARITIN) 10 MG tablet Take 10 mg by mouth daily.    . montelukast (SINGULAIR) 10 MG tablet Take 1 tablet (10 mg total) by mouth at bedtime as needed. 30 tablet 3  . NON FORMULARY Doterra supplements    . Probiotic Product (PROBIOTIC DAILY PO) Take 1 tablet by mouth daily.    . promethazine-dextromethorphan (PROMETHAZINE-DM) 6.25-15 MG/5ML syrup Take 5 mLs by  mouth 4 (four) times daily as needed for cough. 118 mL 0  . ranitidine (ZANTAC) 300 MG tablet Take 1 tablet (300 mg total) by mouth at bedtime as needed for heartburn. 90 tablet 3  . sertraline (ZOLOFT) 100 MG tablet TAKE 1 TABLET DAILY 90 tablet 2  . Vitamin D, Ergocalciferol, (DRISDOL) 50000 units CAPS capsule Take 1 capsule (50,000 Units total) by mouth every 7 (seven) days. 4 capsule 4   No current facility-administered medications on file prior to visit.     BP 122/80   Pulse 85   Temp 98.8 F (37.1 C) (Oral)   Resp 16   SpO2 96%       Objective:   Physical Exam   General  Mental Status - Alert. General Appearance - Well  groomed. Not in acute distress.  Skin Rashes- No Rashes.  Eyes- PERRL bilaterally. Lt eye- mild injected conjunctiva left eye. No DC, No swollen lids. Faint tender left lower lid mid aspect. Inside of lid appears probable  small stye. Nondilated eye exam does not reveal acute abnormality(but exam difficult)  Rt eye- no abnormality. EOM intact  See [facility]- 20/70 left eye(consistent with hx). Without glasses could not discriminate rt eye. With glasses much better vision both sides. See VA placed by MA.   Chest and Lung Exam Auscultation: Breath Sounds:-Clear even and unlabored.  Cardiovascular Auscultation:Rythm- Regular, rate and rhythm. Murmurs & Other Heart Sounds:Ausculatation of the heart reveal- No Murmurs.  Lymphatic Head & Neck General Head & Neck Lymphatics: Bilateral: Description- No Localized lymphadenopathy.      Assessment & Plan:  For your recent eye symptoms will treat you for stye lower lid. You blurred vision may be associated with watery eye. I did not see acute abnormality on non dilated eye exam today.   I want you to start tobrex eye drops but please update me if your eye symptoms change or worsen. Particularly any cloudy vision, black out fields, eye pressure, new floaters or light flashing. Any severe acute signs or symptoms then ED evaluation.  Follow up on Monday or as needed

## 2016-09-12 NOTE — Patient Instructions (Addendum)
For your recent eye symptoms will treat you for stye lower lid. You blurred vision may be associated with watery eye. I did not see acute abnormality on non dilated eye exam today.   I want you to start tobrex eye drops but please update me if your eye symptoms change or worsen. Particularly any cloudy vision, black out fields, eye pressure, new floaters or light flashing. Any severe acute signs or symptoms then ED evaluation.  Follow up on Monday or as needed

## 2016-09-16 ENCOUNTER — Ambulatory Visit (INDEPENDENT_AMBULATORY_CARE_PROVIDER_SITE_OTHER): Payer: Medicare Other | Admitting: Medical

## 2016-09-16 DIAGNOSIS — H00015 Hordeolum externum left lower eyelid: Secondary | ICD-10-CM

## 2016-09-16 DIAGNOSIS — H1032 Unspecified acute conjunctivitis, left eye: Secondary | ICD-10-CM | POA: Diagnosis not present

## 2016-09-16 DIAGNOSIS — H029 Unspecified disorder of eyelid: Secondary | ICD-10-CM

## 2016-09-16 NOTE — Progress Notes (Signed)
Subjective:    Patient ID: Olivia Hodge, female    DOB: [birth year], 66 y.o.   MRN: [id number]  HPI  Pt in for follow up.  She states her eyes are better.  No eye pressure, no light flashes. No pain. Redness little better. Mild clear/water dc at times. No yellow dc.  Last time treated for probable stye and wanted her to follow up. She is on tobrex.  Pt has not had eye exam in 2-3 years.   Pt has specialist(opthalmologist) Dr. Katy Apo.    Review of Systems  Constitutional: Negative for chills, fatigue and fever.  HENT: Negative for congestion.   Eyes:       See hpi.  Respiratory: Negative for cough, chest tightness, shortness of breath and wheezing.   Cardiovascular: Negative for chest pain and palpitations.  Gastrointestinal: Negative for abdominal pain.  Genitourinary: Negative for dysuria, frequency and vaginal pain.  Musculoskeletal: Negative for back pain.  Neurological: Negative for dizziness, seizures and headaches.  Hematological: Negative for adenopathy. Does not bruise/bleed easily.  Psychiatric/Behavioral: Negative for behavioral problems and confusion.   Past Medical History:  Diagnosis Date  . Allergic state 08/12/2016  . Allergy   . B12 deficiency   . History of chicken pox   . Hyperlipidemia   . Thyroid disease   . Vitamin D deficiency 05/11/2015     Social History   Social History  . Marital status: Divorced    Spouse name: N/A  . Number of children: N/A  . Years of education: N/A   Occupational History  . Not on file.   Social History Main Topics  . Smoking status: Former Smoker    Quit date: 02/26/1992  . Smokeless tobacco: Never Used  . Alcohol use Yes  . Drug use: No  . 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 Not on file   Social History Narrative  . No narrative on file    Past Surgical History:    Procedure Laterality Date  . APPENDECTOMY      Family History  Problem Relation Age of Onset  . Adopted: Yes  . COPD Daughter   . Birth defects Daughter        recurrent bronchitis  . ADD / ADHD Son     Allergies  Allergen Reactions  . Penicillins Hives  . Tetracyclines & Related Nausea And Vomiting  . Erythromycin Nausea Only    Makes patient really sick    Current Outpatient Prescriptions on File Prior to Visit  Medication Sig Dispense Refill  . azelastine (ASTELIN) 0.1 % nasal spray Place 2 sprays into both nostrils 2 (two) times daily. Use in each nostril as directed 30 mL 3  . ezetimibe (ZETIA) 10 MG tablet TAKE 1 TABLET DAILY 90 tablet 2  . levothyroxine (SYNTHROID, LEVOTHROID) 175 MCG tablet TAKE 1 TABLET DAILY BEFORE BREAKFAST 90 tablet 1  . loratadine (CLARITIN) 10 MG tablet Take 10 mg by mouth daily.    . montelukast (SINGULAIR) 10 MG tablet Take 1 tablet (10 mg total) by mouth at bedtime as needed. 30 tablet 3  . NON FORMULARY Doterra supplements    . Probiotic Product (PROBIOTIC DAILY PO) Take 1 tablet by mouth daily.    . promethazine-dextromethorphan (PROMETHAZINE-DM) 6.25-15 MG/5ML syrup Take 5 mLs by mouth 4 (four) times daily as needed for cough. 118 mL 0  .  ranitidine (ZANTAC) 300 MG tablet Take 1 tablet (300 mg total) by mouth at bedtime as needed for heartburn. 90 tablet 3  . sertraline (ZOLOFT) 100 MG tablet TAKE 1 TABLET DAILY 90 tablet 2  . tobramycin (TOBREX) 0.3 % ophthalmic solution Place 2 drops into the left eye every 6 (six) hours. 5 mL 0  . Vitamin D, Ergocalciferol, (DRISDOL) 50000 units CAPS capsule Take 1 capsule (50,000 Units total) by mouth every 7 (seven) days. 4 capsule 4   No current facility-administered medications on file prior to visit.     There were no vitals taken for this visit.      Objective:   Physical Exam   General  Mental Status - Alert. General Appearance - Well groomed. Not in acute distress.  Skin Rashes-  No Rashes.  Eyes- PERRL bilaterally. Lt eye- conjunctiva left eye. No DC, No swollen lids. No faint tender left lower lid mid aspect. Inside of lid appears probable dark red compared to rest of inner lid.   Rt eye- no abnormality. EOM intact  No vision change since last visit.   Chest and Lung Exam Auscultation: Breath Sounds:-Clear even and unlabored.  Cardiovascular Auscultation:Rythm- Regular, rate and rhythm. Murmurs & Other Heart Sounds:Ausculatation of the heart reveal- No Murmurs.  Lymphatic Head & Neck General Head & Neck Lymphatics: Bilateral: Description- No Localized lymphadenopathy.          Assessment & Plan:  Your eye looks a little better but not completely. Will go ahead and refer you to eye MD. Will ask for 7-10 day appointment. Call our office by Thursday this week if no call by then. If symptoms change of worsen before then notify us.  Follow up as needed  Sarena Jezek, Percell Miller, PA-C

## 2016-09-16 NOTE — Patient Instructions (Addendum)
Your eye looks a little better but not completely. Will go ahead and refer you to eye MD. Will ask for 7-10 day appointment. Call our office by Thursday this week if no call by then. If symptoms change of worsen before then notify us.  Follow up as needed

## 2016-10-09 ENCOUNTER — Ambulatory Visit (INDEPENDENT_AMBULATORY_CARE_PROVIDER_SITE_OTHER): Payer: Medicare Other | Admitting: Behavioral Health

## 2016-10-09 DIAGNOSIS — E538 Deficiency of other specified B group vitamins: Secondary | ICD-10-CM | POA: Diagnosis not present

## 2016-10-09 MED ORDER — CYANOCOBALAMIN 1000 MCG/ML IJ SOLN
1000.0000 ug | Freq: Once | INTRAMUSCULAR | Status: AC
  Administered 2016-10-09: 1000 ug via INTRAMUSCULAR

## 2016-10-09 NOTE — Progress Notes (Addendum)
Pre visit review using our clinic review tool, if applicable. No additional management support is needed unless otherwise documented below in the visit note.  Patient came in clinic for monthly B12 injection. IM injection was given in the left deltoid. Patient tolerated it well. Next appointment scheduled for 11/07/16 at 9:30 AM.  Kathlene November, MD\

## 2016-10-16 ENCOUNTER — Other Ambulatory Visit: Payer: Self-pay | Admitting: Family Medicine

## 2016-10-16 DIAGNOSIS — Z1231 Encounter for screening mammogram for malignant neoplasm of breast: Secondary | ICD-10-CM

## 2016-10-17 ENCOUNTER — Ambulatory Visit (HOSPITAL_BASED_OUTPATIENT_CLINIC_OR_DEPARTMENT_OTHER)
Admission: RE | Admit: 2016-10-17 | Discharge: 2016-10-17 | Disposition: A | Discharge status: Home / Self Care | Payer: Medicare Other | Source: Ambulatory Visit | Attending: Family Medicine | Admitting: Family Medicine

## 2016-10-17 DIAGNOSIS — Z1231 Encounter for screening mammogram for malignant neoplasm of breast: Secondary | ICD-10-CM | POA: Diagnosis present

## 2016-10-22 ENCOUNTER — Telehealth: Payer: Self-pay

## 2016-10-22 NOTE — Telephone Encounter (Signed)
Called patient to inform her Dr. Charlett Blake received her paperwork. She was advised that she needs an ultrasound. I left message for patient to call and let us know if she wants the ultrasound.   PC

## 2016-11-03 ENCOUNTER — Other Ambulatory Visit: Payer: Self-pay | Admitting: Family Medicine

## 2016-11-03 DIAGNOSIS — E210 Primary hyperparathyroidism: Secondary | ICD-10-CM

## 2016-11-03 DIAGNOSIS — E538 Deficiency of other specified B group vitamins: Secondary | ICD-10-CM

## 2016-11-03 DIAGNOSIS — E559 Vitamin D deficiency, unspecified: Secondary | ICD-10-CM

## 2016-11-03 DIAGNOSIS — E785 Hyperlipidemia, unspecified: Secondary | ICD-10-CM

## 2016-11-03 DIAGNOSIS — E039 Hypothyroidism, unspecified: Secondary | ICD-10-CM

## 2016-11-04 ENCOUNTER — Other Ambulatory Visit: Payer: Self-pay

## 2016-11-04 DIAGNOSIS — E210 Primary hyperparathyroidism: Secondary | ICD-10-CM

## 2016-11-04 DIAGNOSIS — E538 Deficiency of other specified B group vitamins: Secondary | ICD-10-CM

## 2016-11-04 DIAGNOSIS — E559 Vitamin D deficiency, unspecified: Secondary | ICD-10-CM

## 2016-11-04 MED ORDER — SERTRALINE HCL 100 MG PO TABS: 100.0000 mg | ORAL_TABLET | Freq: Every day | ORAL | 1 refills | Status: DC

## 2016-11-06 ENCOUNTER — Ambulatory Visit (INDEPENDENT_AMBULATORY_CARE_PROVIDER_SITE_OTHER): Payer: Medicare Other

## 2016-11-06 DIAGNOSIS — E538 Deficiency of other specified B group vitamins: Secondary | ICD-10-CM

## 2016-11-06 MED ORDER — CYANOCOBALAMIN 1000 MCG/ML IJ SOLN
1000.0000 ug | Freq: Once | INTRAMUSCULAR | Status: AC
  Administered 2016-11-06: 1000 ug via INTRAMUSCULAR

## 2016-11-06 NOTE — Progress Notes (Signed)
Pre visit review using our clinic tool,if applicable. No additional management support is needed unless otherwise documented below in the visit note.   Patient in for B12 injection per order from Dr. Frederik Pear due to patient being B12 defiecient.  Given 1000 mcg IM patient tolerated well. No complaints voiced this visit.  Return appointment scheduled for 1 month.

## 2016-11-06 NOTE — Progress Notes (Signed)
Noted. Agree with above.  

## 2016-11-07 ENCOUNTER — Ambulatory Visit: Payer: Medicare Other

## 2016-12-04 ENCOUNTER — Ambulatory Visit (INDEPENDENT_AMBULATORY_CARE_PROVIDER_SITE_OTHER): Payer: Medicare Other

## 2016-12-04 DIAGNOSIS — Z23 Encounter for immunization: Secondary | ICD-10-CM | POA: Diagnosis not present

## 2016-12-04 DIAGNOSIS — E538 Deficiency of other specified B group vitamins: Secondary | ICD-10-CM

## 2016-12-04 MED ORDER — CYANOCOBALAMIN 1000 MCG/ML IJ SOLN
1000.0000 ug | Freq: Once | INTRAMUSCULAR | Status: AC
  Administered 2016-12-04: 1000 ug via INTRAMUSCULAR

## 2016-12-04 NOTE — Progress Notes (Signed)
Pre visit review using our clinic tool,if applicable. No additional management support is needed unless otherwise documented below in the visit note.   Patient in for b12 injesction per order from Dr. Frederik Pear. Patient has B12 deficiency. Patient also requested Flu shot  Given High dose flu shot Right deltoid, and B12 injection Left deltoid. Patient tolerated well.  No complaints voiced other than pain from fall which has been addressed by provider.   Appointment scheduled for 1 month for next B12 injection.

## 2016-12-08 ENCOUNTER — Other Ambulatory Visit: Payer: Self-pay | Admitting: Family Medicine

## 2016-12-08 DIAGNOSIS — E559 Vitamin D deficiency, unspecified: Secondary | ICD-10-CM

## 2016-12-08 DIAGNOSIS — E538 Deficiency of other specified B group vitamins: Secondary | ICD-10-CM

## 2016-12-08 DIAGNOSIS — E210 Primary hyperparathyroidism: Secondary | ICD-10-CM

## 2016-12-08 DIAGNOSIS — E039 Hypothyroidism, unspecified: Secondary | ICD-10-CM

## 2016-12-08 DIAGNOSIS — E785 Hyperlipidemia, unspecified: Secondary | ICD-10-CM

## 2017-01-03 ENCOUNTER — Ambulatory Visit (INDEPENDENT_AMBULATORY_CARE_PROVIDER_SITE_OTHER): Payer: Medicare Other

## 2017-01-03 DIAGNOSIS — E538 Deficiency of other specified B group vitamins: Secondary | ICD-10-CM | POA: Diagnosis not present

## 2017-01-03 MED ORDER — CYANOCOBALAMIN 1000 MCG/ML IJ SOLN
1000.0000 ug | Freq: Once | INTRAMUSCULAR | Status: AC
  Administered 2017-01-03 (×2): 1000 ug via INTRAMUSCULAR

## 2017-01-03 NOTE — Progress Notes (Signed)
Pre visit review using our clinic tool,if applicable. No additional management support is needed unless otherwise documented below in the visit note.   Patient in for B12 injection given  per order from Dr. Charlett Blake due to patient having B12 deficieny.  B12 injection given IM left deltoid. No complaints voiced. Return appointment given. Patient states she has AWV scheduled.

## 2017-01-14 ENCOUNTER — Telehealth: Payer: Self-pay | Admitting: Family Medicine

## 2017-01-14 NOTE — Telephone Encounter (Signed)
Left message for patient to call to schedule.    Dr. Deborra Medina cannot accommodate patient's request for new to transfer and CPE before end of year.  However, our other female provider Wilfred Lacy can accommodate patient.  Created CRM with this message so patient can be seen by either Wilfred Lacy or even Dr. Ethelene Hal.     Copied from [place] [phone]. Topic: Appointment Scheduling - Scheduling Inquiry for Clinic >> Jan 09, 2017 10:49 AM Aurelio Brash B wrote: Reason for CRM: PT currently sees Penni Homans at [facility].  She wants to transfer to [facility] with Deborra Medina.  She wants to be seen for a new pt visit and a physical before the end of the year,  Going by onenote, iWe can only schedule one new pt a day and do not see an open slot for new pt before end of year, so  I did not schedule  her but told her I would send this information to the office and we would contact her back

## 2017-01-27 ENCOUNTER — Telehealth: Payer: Self-pay | Admitting: *Deleted

## 2017-01-27 NOTE — Telephone Encounter (Signed)
Sent mychart message to pt.  Copied from [place] [phone]. Topic: Inquiry >> Jan 27, 2017 10:14 AM Olivia Hodge wrote: Reason for CRM: due to a death pt had to cancel her b12 shot on the 4th would like to know if she can get it on her 12.17.18 visit contact pt if needed

## 2017-01-28 ENCOUNTER — Ambulatory Visit: Payer: Medicare Other

## 2017-01-29 ENCOUNTER — Ambulatory Visit: Payer: Medicare Other

## 2017-02-10 ENCOUNTER — Ambulatory Visit (INDEPENDENT_AMBULATORY_CARE_PROVIDER_SITE_OTHER): Payer: Medicare Other | Admitting: Family Medicine

## 2017-02-10 ENCOUNTER — Encounter: Payer: Self-pay | Admitting: Family Medicine

## 2017-02-10 ENCOUNTER — Ambulatory Visit: Payer: Medicare Other | Admitting: *Deleted

## 2017-02-10 VITALS — BP 120/86 | HR 87 | Temp 98.6°F | Resp 18 | Ht 69.0 in | Wt 217.2 lb

## 2017-02-10 DIAGNOSIS — H6192 Disorder of left external ear, unspecified: Secondary | ICD-10-CM

## 2017-02-10 DIAGNOSIS — L989 Disorder of the skin and subcutaneous tissue, unspecified: Secondary | ICD-10-CM | POA: Diagnosis not present

## 2017-02-10 DIAGNOSIS — R7309 Other abnormal glucose: Secondary | ICD-10-CM

## 2017-02-10 DIAGNOSIS — E669 Obesity, unspecified: Secondary | ICD-10-CM

## 2017-02-10 DIAGNOSIS — E538 Deficiency of other specified B group vitamins: Secondary | ICD-10-CM | POA: Diagnosis not present

## 2017-02-10 DIAGNOSIS — J069 Acute upper respiratory infection, unspecified: Secondary | ICD-10-CM | POA: Diagnosis not present

## 2017-02-10 DIAGNOSIS — Z0000 Encounter for general adult medical examination without abnormal findings: Secondary | ICD-10-CM | POA: Diagnosis not present

## 2017-02-10 DIAGNOSIS — E210 Primary hyperparathyroidism: Secondary | ICD-10-CM

## 2017-02-10 DIAGNOSIS — E559 Vitamin D deficiency, unspecified: Secondary | ICD-10-CM | POA: Diagnosis not present

## 2017-02-10 DIAGNOSIS — E785 Hyperlipidemia, unspecified: Secondary | ICD-10-CM

## 2017-02-10 DIAGNOSIS — E039 Hypothyroidism, unspecified: Secondary | ICD-10-CM | POA: Diagnosis not present

## 2017-02-10 LAB — LIPID PANEL
CHOLESTEROL: 185 mg/dL (ref 0–200)
HDL: 53 mg/dL (ref >39.00)
LDL CALC: 99 mg/dL (ref 0–99)
NonHDL: 131.85
TRIGLYCERIDES: 166 mg/dL — AB (ref 0.0–149.0)
Total CHOL/HDL Ratio: 3
VLDL: 33.2 mg/dL (ref 0.0–40.0)

## 2017-02-10 LAB — VITAMIN D 25 HYDROXY (VIT D DEFICIENCY, FRACTURES): VITD: 26.43 ng/mL — ABNORMAL LOW (ref 30.00–100.00)

## 2017-02-10 LAB — CBC
HCT: 41.6 % (ref 36.0–46.0)
HEMOGLOBIN: 13.8 g/dL (ref 12.0–15.0)
MCHC: 33.2 g/dL (ref 30.0–36.0)
MCV: 95.4 fl (ref 78.0–100.0)
PLATELETS: 237 10*3/uL (ref 150.0–400.0)
RBC: 4.36 Mil/uL (ref 3.87–5.11)
RDW: 14.1 % (ref 11.5–15.5)
WBC: 6.5 10*3/uL (ref 4.0–10.5)

## 2017-02-10 LAB — VITAMIN B12: VITAMIN B 12: 431 pg/mL (ref 211–911)

## 2017-02-10 LAB — COMPREHENSIVE METABOLIC PANEL
ALBUMIN: 4.3 g/dL (ref 3.5–5.2)
ALT: 25 U/L (ref 0–35)
AST: 18 U/L (ref 0–37)
Alkaline Phosphatase: 70 U/L (ref 39–117)
BILIRUBIN TOTAL: 0.9 mg/dL (ref 0.2–1.2)
BUN: 14 mg/dL (ref 6–23)
CALCIUM: 10.2 mg/dL (ref 8.4–10.5)
CHLORIDE: 103 meq/L (ref 96–112)
CO2: 29 meq/L (ref 19–32)
CREATININE: 0.79 mg/dL (ref 0.40–1.20)
GFR: 77.14 mL/min (ref >60.00)
Glucose, Bld: 95 mg/dL (ref 70–99)
Potassium: 4.3 mEq/L (ref 3.5–5.1)
Sodium: 138 mEq/L (ref 135–145)
Total Protein: 7 g/dL (ref 6.0–8.3)

## 2017-02-10 LAB — TSH: TSH: 1.69 u[IU]/mL (ref 0.35–4.50)

## 2017-02-10 LAB — HEMOGLOBIN A1C: Hgb A1c MFr Bld: 5.8 % (ref 4.6–6.5)

## 2017-02-10 MED ORDER — PROMETHAZINE-DM 6.25-15 MG/5ML PO SYRP: 5.0000 mL | ORAL_SOLUTION | Freq: Four times a day (QID) | ORAL | 0 refills | Status: DC | PRN

## 2017-02-10 MED ORDER — AZELASTINE HCL 0.1 % NA SOLN: 2.0000 | Freq: Two times a day (BID) | NASAL | 3 refills | Status: DC

## 2017-02-10 MED ORDER — EZETIMIBE 10 MG PO TABS: 10.0000 mg | ORAL_TABLET | Freq: Every day | ORAL | 2 refills | Status: DC

## 2017-02-10 MED ORDER — CYANOCOBALAMIN 1000 MCG/ML IJ SOLN
1000.0000 ug | Freq: Once | INTRAMUSCULAR | Status: AC
  Administered 2017-02-10: 1000 ug via INTRAMUSCULAR

## 2017-02-10 MED ORDER — LEVOTHYROXINE SODIUM 175 MCG PO TABS: 175.0000 ug | ORAL_TABLET | Freq: Every day | ORAL | 1 refills | Status: DC

## 2017-02-10 MED ORDER — SERTRALINE HCL 100 MG PO TABS: 100.0000 mg | ORAL_TABLET | Freq: Every day | ORAL | 1 refills | Status: DC

## 2017-02-10 NOTE — Assessment & Plan Note (Signed)
Given shot today. Check level

## 2017-02-10 NOTE — Assessment & Plan Note (Signed)
Check TSH today

## 2017-02-10 NOTE — Assessment & Plan Note (Signed)
minimize simple carbs. Increase exercise as tolerated.  

## 2017-02-10 NOTE — Assessment & Plan Note (Signed)
Check PTH today

## 2017-02-10 NOTE — Patient Instructions (Addendum)
Consider the new shingles shot called Shingrix is the shingles shot, 2 shots over 2-6 months Kegel exercise 10 twice daily Lidocaine gel or patches, companies of Aspercreme, icy Hot and Salon Pas.   Slippery Elm tea and/or Elderberry liquid Preventive Care 65 Years and Older, Female Preventive care refers to lifestyle choices and visits with your health care provider that can promote health and wellness. What does preventive care include?  A yearly physical exam. This is also called an annual well check.  Dental exams once or twice a year.  Routine eye exams. Ask your health care provider how often you should have your eyes checked.  Personal lifestyle choices, including: ? Daily care of your teeth and gums. ? Regular physical activity. ? Eating a healthy diet. ? Avoiding tobacco and drug use. ? Limiting alcohol use. ? Practicing safe sex. ? Taking low-dose aspirin every day. ? Taking vitamin and mineral supplements as recommended by your health care provider. What happens during an annual well check? The services and screenings done by your health care provider during your annual well check will depend on your age, overall health, lifestyle risk factors, and family history of disease. Counseling Your health care provider may ask you questions about your:  Alcohol use.  Tobacco use.  Drug use.  Emotional well-being.  Home and relationship well-being.  Sexual activity.  Eating habits.  History of falls.  Memory and ability to understand (cognition).  Work and work Statistician.  Reproductive health.  Screening You may have the following tests or measurements:  Height, weight, and BMI.  Blood pressure.  Lipid and cholesterol levels. These may be checked every 5 years, or more frequently if you are over 48 years old.  Skin check.  Lung cancer screening. You may have this screening every year starting at age 76 if you have a 30-pack-year history of smoking and  currently smoke or have quit within the past 15 years.  Fecal occult blood test (FOBT) of the stool. You may have this test every year starting at age 40.  Flexible sigmoidoscopy or colonoscopy. You may have a sigmoidoscopy every 5 years or a colonoscopy every 10 years starting at age 53.  Hepatitis C blood test.  Hepatitis B blood test.  Sexually transmitted disease (STD) testing.  Diabetes screening. This is done by checking your blood sugar (glucose) after you have not eaten for a while (fasting). You may have this done every 1-3 years.  Bone density scan. This is done to screen for osteoporosis. You may have this done starting at age 67.  Mammogram. This may be done every 1-2 years. Talk to your health care provider about how often you should have regular mammograms.  Talk with your health care provider about your test results, treatment options, and if necessary, the need for more tests. Vaccines Your health care provider may recommend certain vaccines, such as:  Influenza vaccine. This is recommended every year.  Tetanus, diphtheria, and acellular pertussis (Tdap, Td) vaccine. You may need a Td booster every 10 years.  Varicella vaccine. You may need this if you have not been vaccinated.  Zoster vaccine. You may need this after age 5.  Measles, mumps, and rubella (MMR) vaccine. You may need at least one dose of MMR if you were born in 1957 or later. You may also need a second dose.  Pneumococcal 13-valent conjugate (PCV13) vaccine. One dose is recommended after age 31.  Pneumococcal polysaccharide (PPSV23) vaccine. One dose is recommended after age  67.  Meningococcal vaccine. You may need this if you have certain conditions.  Hepatitis A vaccine. You may need this if you have certain conditions or if you travel or work in places where you may be exposed to hepatitis A.  Hepatitis B vaccine. You may need this if you have certain conditions or if you travel or work in  places where you may be exposed to hepatitis B.  Haemophilus influenzae type b (Hib) vaccine. You may need this if you have certain conditions.  Talk to your health care provider about which screenings and vaccines you need and how often you need them. This information is not intended to replace advice given to you by your health care provider. Make sure you discuss any questions you have with your health care provider. Document Released: 03/10/2015 Document Revised: 11/01/2015 Document Reviewed: 12/13/2014 Elsevier Interactive Patient Education  2017 [employer].

## 2017-02-10 NOTE — Assessment & Plan Note (Signed)
Patient encouraged to maintain heart healthy diet, regular exercise, adequate sleep. Consider daily probiotics. Take medications as prescribed 

## 2017-02-10 NOTE — Assessment & Plan Note (Signed)
Encouraged heart healthy diet, increase exercise, avoid trans fats, consider a krill oil cap daily 

## 2017-02-10 NOTE — Assessment & Plan Note (Signed)
Take a 2000 IU daily supplements and monitor.

## 2017-02-10 NOTE — Assessment & Plan Note (Deleted)
On Levothyroxine, continue to monitor 

## 2017-02-10 NOTE — Progress Notes (Signed)
Subjective:  I acted as a Education administrator for Dr. Charlett Blake. Olivia Hodge, [place]  Patient ID: Olivia Hodge, female    DOB: [birth year], 66 y.o.   MRN: [id number]  No chief complaint on file.   HPI  Patient is in today for an annual exam and follow up on chronic medical conditions including vitamin D Deficiency, Vitamin B 12 deficiency, obesity, hyperlipidemia and more. She feels well today but does note she had b/l cataract surgery which was very helpful. She suffered a bad fall back on 9/23 with a fracture noted in shoulder and 2 hairline fractures in right arm per patient. She is following with Dr Novella Olive and is slowly improving with PT. Had one other fall when she  Tripped in her room on a cruise but it was very cramp quarters. At home she has been fine. Denies CP/palp/SOB/HA/congestion/fevers/GI or GU c/o. Taking meds as prescribed  Patient Care Team: Mosie Lukes, MD as PCP - General (Family Medicine) Renato Shin, MD as Consulting Physician (Endocrinology) Gastroenterology, Sadie Haber as Consulting Physician   Past Medical History:  Diagnosis Date  . Allergic state 08/12/2016  . Allergy   . B12 deficiency   . History of chicken pox   . Hyperlipidemia   . Thyroid disease   . Vitamin D deficiency 05/11/2015    Past Surgical History:  Procedure Laterality Date  . APPENDECTOMY    . CATARACT EXTRACTION, BILATERAL Bilateral   . EYE SURGERY Bilateral 2018   cataracts, Dr Gillian Scarce    Family History  Adopted: Yes  Problem Relation Age of Onset  . COPD Daughter   . Birth defects Daughter        recurrent bronchitis  . ADD / ADHD Son     Social History   Socioeconomic History  . Marital status: Divorced    Spouse name: Not on file  . Number of children: Not on file  . Years of education: Not on file  . Highest education level: Not on file  Social Needs  . Financial resource strain: Not on file  . Food insecurity - worry: Not on file  . Food insecurity - inability: Not on file  .  Transportation needs - medical: Not on file  . Transportation needs - non-medical: Not on file  Occupational History  . Not on file  Tobacco Use  . Smoking status: Former Smoker    Last attempt to quit: 02/26/1992    Years since quitting: 24.9  . Smokeless tobacco: Never Used  Substance and Sexual Activity  . Alcohol use: Yes  . Drug use: No  . 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 Not on file  Social History Narrative  . Not on file    Outpatient Medications Prior to Visit  Medication Sig Dispense Refill  . loratadine (CLARITIN) 10 MG tablet Take 10 mg by mouth daily.    . montelukast (SINGULAIR) 10 MG tablet Take 1 tablet (10 mg total) by mouth at bedtime as needed. 30 tablet 3  . NON FORMULARY Doterra supplements    . Probiotic Product (PROBIOTIC DAILY PO) Take 1 tablet by mouth daily.    . ranitidine (ZANTAC) 300 MG tablet Take 1 tablet (300 mg total) by mouth at bedtime as needed for heartburn. 90 tablet 3  . tobramycin (TOBREX) 0.3 % ophthalmic solution Place 2 drops into the left eye every 6 (six)  hours. 5 mL 0  . Vitamin D, Ergocalciferol, (DRISDOL) 50000 units CAPS capsule Take 1 capsule (50,000 Units total) by mouth every 7 (seven) days. 4 capsule 4  . azelastine (ASTELIN) 0.1 % nasal spray Place 2 sprays into both nostrils 2 (two) times daily. Use in each nostril as directed 30 mL 3  . ezetimibe (ZETIA) 10 MG tablet TAKE 1 TABLET DAILY 90 tablet 2  . levothyroxine (SYNTHROID, LEVOTHROID) 175 MCG tablet TAKE 1 TABLET DAILY BEFORE BREAKFAST 90 tablet 1  . promethazine-dextromethorphan (PROMETHAZINE-DM) 6.25-15 MG/5ML syrup Take 5 mLs by mouth 4 (four) times daily as needed for cough. 118 mL 0  . sertraline (ZOLOFT) 100 MG tablet Take 1 tablet (100 mg total) by mouth daily. 90 tablet 1   No facility-administered medications prior to visit.     Allergies    Allergen Reactions  . Penicillins Hives  . Tetracyclines & Related Nausea And Vomiting  . Erythromycin Nausea Only    Makes patient really sick    Review of Systems  Constitutional: Negative for chills, fever and malaise/fatigue.  HENT: Negative for congestion and hearing loss.   Eyes: Negative for discharge.  Respiratory: Negative for cough, sputum production and shortness of breath.   Cardiovascular: Negative for chest pain, palpitations and leg swelling.  Gastrointestinal: Negative for abdominal pain, blood in stool, constipation, diarrhea, heartburn, nausea and vomiting.  Genitourinary: Negative for dysuria, frequency, hematuria and urgency.  Musculoskeletal: Positive for falls and joint pain. Negative for back pain and myalgias.  Skin: Negative for rash.  Neurological: Negative for dizziness, sensory change, loss of consciousness, weakness and headaches.  Endo/Heme/Allergies: Negative for environmental allergies. Does not bruise/bleed easily.  Psychiatric/Behavioral: Negative for depression and suicidal ideas. The patient is not nervous/anxious and does not have insomnia.        Objective:    Physical Exam  Constitutional: She is oriented to person, place, and time. She appears well-developed and well-nourished. No distress.  HENT:  Head: Normocephalic and atraumatic.  Eyes: Conjunctivae are normal.  Neck: Neck supple. No thyromegaly present.  Cardiovascular: Normal rate, regular rhythm and normal heart sounds.  No murmur heard. Pulmonary/Chest: Effort normal and breath sounds normal. No respiratory distress.  Abdominal: Soft. Bowel sounds are normal. She exhibits no distension and no mass. There is no tenderness.  Musculoskeletal: She exhibits no edema.  Lymphadenopathy:    She has no cervical adenopathy.  Neurological: She is alert and oriented to person, place, and time.  Skin: Skin is warm and dry.  Psychiatric: She has a normal mood and affect. Her behavior is  normal.    BP 120/86 (BP Location: Left Arm, Patient Position: Sitting, Cuff Size: Normal)   Pulse 87   Temp 98.6 F (37 C) (Oral)   Resp 18   Ht 5\' 9"  (1.753 m)   Wt 217 lb 3.2 oz (98.5 kg)   SpO2 98%   BMI 32.07 kg/m  Wt Readings from Last 3 Encounters:  02/10/17 217 lb 3.2 oz (98.5 kg)  08/12/16 225 lb 9.6 oz (102.3 kg)  01/29/16 220 lb (99.8 kg)   BP Readings from Last 3 Encounters:  02/10/17 120/86  09/12/16 122/80  08/12/16 118/74     Immunization History  Administered Date(s) Administered  . Influenza, High Dose Seasonal PF 12/13/2015, 12/04/2016  . Influenza,inj,Quad PF,6+ Mos 10/24/2013  . Influenza-Unspecified 09/26/2014  . Tdap 05/25/2014    Health Maintenance  Topic Date Due  . PNA vac Low Risk Adult (1 of 2 -  PCV13) 01/22/2015  . MAMMOGRAM  10/18/2018  . COLONOSCOPY  12/22/2019  . TETANUS/TDAP  05/24/2024  . INFLUENZA VACCINE  Completed  . DEXA SCAN  Completed  . Hepatitis C Screening  Completed    Lab Results  Component Value Date   WBC 5.9 08/12/2016   HGB 14.4 08/12/2016   HCT 43.2 08/12/2016   PLT 189.0 08/12/2016   GLUCOSE 102 (H) 08/12/2016   CHOL 226 (H) 08/12/2016   TRIG 317.0 (H) 08/12/2016   HDL 51.10 08/12/2016   LDLDIRECT 140.0 08/12/2016   LDLCALC 145 (H) 01/29/2016   ALT 14 08/12/2016   AST 17 08/12/2016   NA 139 08/12/2016   K 4.4 08/12/2016   CL 104 08/12/2016   CREATININE 0.91 08/12/2016   BUN 17 08/12/2016   CO2 30 08/12/2016   TSH 1.57 08/12/2016   HGBA1C 6.0 08/12/2016    Lab Results  Component Value Date   TSH 1.57 08/12/2016   Lab Results  Component Value Date   WBC 5.9 08/12/2016   HGB 14.4 08/12/2016   HCT 43.2 08/12/2016   MCV 94.5 08/12/2016   PLT 189.0 08/12/2016   Lab Results  Component Value Date   NA 139 08/12/2016   K 4.4 08/12/2016   CO2 30 08/12/2016   GLUCOSE 102 (H) 08/12/2016   BUN 17 08/12/2016   CREATININE 0.91 08/12/2016   BILITOT 0.4 08/12/2016   ALKPHOS 57 08/12/2016   AST 17  08/12/2016   ALT 14 08/12/2016   PROT 6.9 08/12/2016   ALBUMIN 4.4 08/12/2016   CALCIUM 10.9 (H) 08/12/2016   ANIONGAP 8 02/16/2014   GFR 65.63 08/12/2016   Lab Results  Component Value Date   CHOL 226 (H) 08/12/2016   Lab Results  Component Value Date   HDL 51.10 08/12/2016   Lab Results  Component Value Date   LDLCALC 145 (H) 01/29/2016   Lab Results  Component Value Date   TRIG 317.0 (H) 08/12/2016   Lab Results  Component Value Date   CHOLHDL 4 08/12/2016   Lab Results  Component Value Date   HGBA1C 6.0 08/12/2016         Assessment & Plan:   Problem List Items Addressed This Visit    Hypothyroidism    Check TSH today      Relevant Medications   ezetimibe (ZETIA) 10 MG tablet   levothyroxine (SYNTHROID, LEVOTHROID) 175 MCG tablet   Other Relevant Orders   TSH   Obesity (BMI 30-39.9)    Encouraged DASH diet, decrease po intake and increase exercise as tolerated. Needs 7-8 hours of sleep nightly. Avoid trans fats, eat small, frequent meals every 4-5 hours with lean proteins, complex carbs and healthy fats. Minimize simple carbs, has lost 7 pounds since last visit. Consider bariatric referral      Hyperlipidemia    Encouraged heart healthy diet, increase exercise, avoid trans fats, consider a krill oil cap daily      Relevant Medications   ezetimibe (ZETIA) 10 MG tablet   Other Relevant Orders   Lipid panel   Elevated glucose    minimize simple carbs. Increase exercise as tolerated.       Relevant Orders   Hemoglobin A1c   Comprehensive metabolic panel   Preventative health care    Patient encouraged to maintain heart healthy diet, regular exercise, adequate sleep. Consider daily probiotics. Take medications as prescribed      B12 deficiency    Given shot today. Check level  Relevant Medications   ezetimibe (ZETIA) 10 MG tablet   sertraline (ZOLOFT) 100 MG tablet   cyanocobalamin ((VITAMIN B-12)) injection 1,000 mcg (Completed)    Other Relevant Orders   Vitamin B12   Hyperparathyroidism, primary (HCC)    Check PTH today      Relevant Medications   ezetimibe (ZETIA) 10 MG tablet   sertraline (ZOLOFT) 100 MG tablet   Other Relevant Orders   PTH, Intact and Calcium   Vitamin D deficiency    Take a 2000 IU daily supplements and monitor.      Relevant Medications   ezetimibe (ZETIA) 10 MG tablet   sertraline (ZOLOFT) 100 MG tablet   Other Relevant Orders   VITAMIN D 25 Hydroxy (Vit-D Deficiency, Fractures)   Upper respiratory infection    Symptoms for a couple of weeks improving but cough always lingers for her. Given refill on cough syrup tp use prn. Call if worsens      Relevant Orders   CBC    Other Visit Diagnoses    Skin lesion of left ear    -  Primary   Relevant Orders   Ambulatory referral to Dermatology      I have changed Olivia Hodge's ezetimibe and levothyroxine. I am also having her maintain her loratadine, NON FORMULARY, Probiotic Product (PROBIOTIC DAILY PO), ranitidine, montelukast, Vitamin D (Ergocalciferol), tobramycin, azelastine, sertraline, and promethazine-dextromethorphan. We administered cyanocobalamin.  Meds ordered this encounter  Medications  . DISCONTD: promethazine-dextromethorphan (PROMETHAZINE-DM) 6.25-15 MG/5ML syrup    Sig: Take 5 mLs by mouth 4 (four) times daily as needed for cough.    Dispense:  118 mL    Refill:  0  . ezetimibe (ZETIA) 10 MG tablet    Sig: Take 1 tablet (10 mg total) by mouth daily.    Dispense:  90 tablet    Refill:  2  . levothyroxine (SYNTHROID, LEVOTHROID) 175 MCG tablet    Sig: Take 1 tablet (175 mcg total) by mouth daily before breakfast.    Dispense:  90 tablet    Refill:  1  . azelastine (ASTELIN) 0.1 % nasal spray    Sig: Place 2 sprays into both nostrils 2 (two) times daily. Use in each nostril as directed    Dispense:  30 mL    Refill:  3  . sertraline (ZOLOFT) 100 MG tablet    Sig: Take 1 tablet (100 mg total) by mouth daily.     Dispense:  90 tablet    Refill:  1  . promethazine-dextromethorphan (PROMETHAZINE-DM) 6.25-15 MG/5ML syrup    Sig: Take 5 mLs by mouth 4 (four) times daily as needed for cough.    Dispense:  118 mL    Refill:  0  . cyanocobalamin ((VITAMIN B-12)) injection 1,000 mcg    CMA served as scribe during this visit. History, Physical and Plan performed by medical provider. Documentation and orders reviewed and attested to.  Penni Homans, MD

## 2017-02-10 NOTE — Assessment & Plan Note (Signed)
Symptoms for a couple of weeks improving but cough always lingers for her. Given refill on cough syrup tp use prn. Call if worsens

## 2017-02-10 NOTE — Assessment & Plan Note (Addendum)
Encouraged DASH diet, decrease po intake and increase exercise as tolerated. Needs 7-8 hours of sleep nightly. Avoid trans fats, eat small, frequent meals every 4-5 hours with lean proteins, complex carbs and healthy fats. Minimize simple carbs, has lost 7 pounds since last visit. Consider bariatric referral

## 2017-02-12 LAB — PTH, INTACT AND CALCIUM
CALCIUM: 10.6 mg/dL — AB (ref 8.6–10.4)
PTH: 118 pg/mL — ABNORMAL HIGH (ref 14–64)

## 2017-02-13 MED ORDER — VITAMIN D (ERGOCALCIFEROL) 1.25 MG (50000 UNIT) PO CAPS: 50000.0000 [IU] | ORAL_CAPSULE | ORAL | 4 refills | Status: DC

## 2017-02-13 NOTE — Addendum Note (Signed)
Addended by: Magdalene Molly A on: 02/13/2017 10:07 AM   Modules accepted: Orders

## 2017-03-12 ENCOUNTER — Ambulatory Visit (INDEPENDENT_AMBULATORY_CARE_PROVIDER_SITE_OTHER): Payer: Medicare Other

## 2017-03-12 DIAGNOSIS — E538 Deficiency of other specified B group vitamins: Secondary | ICD-10-CM | POA: Diagnosis not present

## 2017-03-12 MED ORDER — CYANOCOBALAMIN 1000 MCG/ML IJ SOLN
1000.0000 ug | Freq: Once | INTRAMUSCULAR | Status: AC
Start: 2017-03-12 — End: 2017-03-12
  Administered 2017-03-12: 1000 ug via INTRAMUSCULAR

## 2017-03-12 NOTE — Progress Notes (Signed)
Pre visit review using our clinic tool,if applicable. No additional management support is needed unless otherwise documented below in the visit note.   Patient in for B12 injection per order from Dr. Frederik Pear due to patient having B12 deficiency.  No complaints voiced this visit.   1000 mcg given IM left deltoid. Patient tolerated well. Return appointment scheduled for 1 month.

## 2017-03-17 ENCOUNTER — Other Ambulatory Visit: Payer: Self-pay | Admitting: Family Medicine

## 2017-04-09 ENCOUNTER — Ambulatory Visit (INDEPENDENT_AMBULATORY_CARE_PROVIDER_SITE_OTHER): Payer: Medicare Other

## 2017-04-09 DIAGNOSIS — E538 Deficiency of other specified B group vitamins: Secondary | ICD-10-CM

## 2017-04-09 MED ORDER — CYANOCOBALAMIN 1000 MCG/ML IJ SOLN
1000.0000 ug | Freq: Once | INTRAMUSCULAR | Status: AC
  Administered 2017-04-09: 1000 ug via INTRAMUSCULAR

## 2017-04-09 NOTE — Progress Notes (Signed)
Pt comes in today for her B12 injection. Received injection in Left arm at [id number] without complication. Follow up injection scheduled for next week as advised.

## 2017-04-10 HISTORY — PX: SHOULDER SURGERY: SHX246

## 2017-04-21 ENCOUNTER — Other Ambulatory Visit: Payer: Self-pay | Admitting: Family Medicine

## 2017-05-06 ENCOUNTER — Ambulatory Visit (INDEPENDENT_AMBULATORY_CARE_PROVIDER_SITE_OTHER): Payer: Medicare Other | Admitting: *Deleted

## 2017-05-06 DIAGNOSIS — E538 Deficiency of other specified B group vitamins: Secondary | ICD-10-CM | POA: Diagnosis not present

## 2017-05-06 MED ORDER — CYANOCOBALAMIN 1000 MCG/ML IJ SOLN
1000.0000 ug | Freq: Once | INTRAMUSCULAR | Status: AC
  Administered 2017-05-06: 1000 ug via INTRAMUSCULAR

## 2017-05-06 NOTE — Progress Notes (Signed)
Pre visit review using our clinic review tool, if applicable. No additional management support is needed unless otherwise documented below in the visit note.  Pt here for monthly B12 injection per Dr Charlett Blake.  Pt given B12 103mcg IM left deltoid and patient tolerated injection well.  Next B12 injection scheduled for 06/11/17 at 9:30am.

## 2017-05-07 ENCOUNTER — Ambulatory Visit: Payer: Medicare Other

## 2017-06-11 ENCOUNTER — Ambulatory Visit (INDEPENDENT_AMBULATORY_CARE_PROVIDER_SITE_OTHER): Payer: Medicare Other

## 2017-06-11 DIAGNOSIS — E538 Deficiency of other specified B group vitamins: Secondary | ICD-10-CM | POA: Diagnosis not present

## 2017-06-11 MED ORDER — CYANOCOBALAMIN 1000 MCG/ML IJ SOLN
1000.0000 ug | Freq: Once | INTRAMUSCULAR | Status: AC
  Administered 2017-06-11: 1000 ug via INTRAMUSCULAR

## 2017-06-11 NOTE — Progress Notes (Addendum)
Pre visit review using our clinic review tool, if applicable. No additional management support is needed unless otherwise documented below in the visit note.  Pt here today for B12 injection. 8mL injected into L deltoid. Pt tolerated injection well.   Next B12 injection due in 4 weeks. Nurse visit scheduled for 07/09/2017.   Kathlene November, MD

## 2017-07-09 ENCOUNTER — Ambulatory Visit (INDEPENDENT_AMBULATORY_CARE_PROVIDER_SITE_OTHER): Payer: Medicare Other

## 2017-07-09 DIAGNOSIS — E538 Deficiency of other specified B group vitamins: Secondary | ICD-10-CM

## 2017-07-09 MED ORDER — CYANOCOBALAMIN 1000 MCG/ML IJ SOLN
1000.0000 ug | Freq: Once | INTRAMUSCULAR | Status: AC
  Administered 2017-07-09: 1000 ug via INTRAMUSCULAR

## 2017-07-09 NOTE — Progress Notes (Addendum)
Pre visit review using our clinic review tool, if applicable. No additional management support is needed unless otherwise documented below in the visit note.  Pt here today for B12 injection. 35mL injected into L deltoid. Pt tolerated injection well.   Next B12 in 4 weeks. Nurse visit scheduled 08/06/2017.   Agree with B12 administration since she does have diagnosis of deficiency.  Mackie Pai, PA-C

## 2017-08-06 ENCOUNTER — Ambulatory Visit: Payer: Medicare Other

## 2017-08-07 ENCOUNTER — Other Ambulatory Visit: Payer: Self-pay

## 2017-08-07 MED ORDER — AZELASTINE HCL 0.1 % NA SOLN: 2.0000 | Freq: Two times a day (BID) | NASAL | 4 refills | Status: DC

## 2017-08-11 ENCOUNTER — Ambulatory Visit (INDEPENDENT_AMBULATORY_CARE_PROVIDER_SITE_OTHER): Payer: Medicare Other

## 2017-08-11 ENCOUNTER — Ambulatory Visit: Payer: Medicare Other | Admitting: Family Medicine

## 2017-08-11 DIAGNOSIS — E538 Deficiency of other specified B group vitamins: Secondary | ICD-10-CM | POA: Diagnosis not present

## 2017-08-11 MED ORDER — CYANOCOBALAMIN 1000 MCG/ML IJ SOLN
1000.0000 ug | Freq: Once | INTRAMUSCULAR | Status: AC
  Administered 2017-08-11: 1000 ug via INTRAMUSCULAR

## 2017-08-11 NOTE — Progress Notes (Signed)
Pt here for monthly B12 injection per Dr. Charlett Blake  B12 1057mcg given IM left arm, and pt tolerated injection well.  Patient scheduled for next month September 11, 2017

## 2017-08-19 ENCOUNTER — Encounter: Payer: Self-pay | Admitting: Family Medicine

## 2017-08-19 ENCOUNTER — Ambulatory Visit (INDEPENDENT_AMBULATORY_CARE_PROVIDER_SITE_OTHER): Payer: Medicare Other | Admitting: Family Medicine

## 2017-08-19 DIAGNOSIS — E559 Vitamin D deficiency, unspecified: Secondary | ICD-10-CM

## 2017-08-19 DIAGNOSIS — E039 Hypothyroidism, unspecified: Secondary | ICD-10-CM

## 2017-08-19 DIAGNOSIS — E538 Deficiency of other specified B group vitamins: Secondary | ICD-10-CM

## 2017-08-19 DIAGNOSIS — E669 Obesity, unspecified: Secondary | ICD-10-CM

## 2017-08-19 DIAGNOSIS — E782 Mixed hyperlipidemia: Secondary | ICD-10-CM | POA: Diagnosis not present

## 2017-08-19 DIAGNOSIS — C439 Malignant melanoma of skin, unspecified: Secondary | ICD-10-CM

## 2017-08-19 DIAGNOSIS — M25511 Pain in right shoulder: Secondary | ICD-10-CM | POA: Diagnosis not present

## 2017-08-19 DIAGNOSIS — R7309 Other abnormal glucose: Secondary | ICD-10-CM

## 2017-08-19 NOTE — Assessment & Plan Note (Signed)
Check level 

## 2017-08-19 NOTE — Patient Instructions (Addendum)
Shingrix is the new shingles shot. 2 shots over 2-6 months at pharmacy  Ibuprofen 400 mg 3-4 x daily (max 2400 mg in 24)  Tylenol/Acetaminophen ES 500mg  tabs 1-2 tabs three x daily (max of 3000 mg in 24 hours)    Cholesterol Cholesterol is a white, waxy, fat-like substance that is needed by the human body in small amounts. The liver makes all the cholesterol we need. Cholesterol is carried from the liver by the blood through the blood vessels. Deposits of cholesterol (plaques) may build up on blood vessel (artery) walls. Plaques make the arteries narrower and stiffer. Cholesterol plaques increase the risk for heart attack and stroke. You cannot feel your cholesterol level even if it is very high. The only way to know that it is high is to have a blood test. Once you know your cholesterol levels, you should keep a record of the test results. Work with your health care provider to keep your levels in the desired range. What do the results mean?  Total cholesterol is a rough measure of all the cholesterol in your blood.  LDL (low-density lipoprotein) is the "bad" cholesterol. This is the type that causes plaque to build up on the artery walls. You want this level to be low.  HDL (high-density lipoprotein) is the "good" cholesterol because it cleans the arteries and carries the LDL away. You want this level to be high.  Triglycerides are fat that the body can either burn for energy or store. High levels are closely linked to heart disease. What are the desired levels of cholesterol?  Total cholesterol below 200.  LDL below 100 for people who are at risk, below 70 for people at very high risk.  HDL above 40 is good. A level of 60 or higher is considered to be protective against heart disease.  Triglycerides below 150. How can I lower my cholesterol? Diet Follow your diet program as told by your health care provider.  Choose fish or white meat chicken and [place], roasted or baked. Limit  fatty cuts of red meat, fried foods, and processed meats, such as sausage and lunch meats.  Eat lots of fresh fruits and vegetables.  Choose whole grains, beans, pasta, potatoes, and cereals.  Choose olive oil, corn oil, or canola oil, and use only small amounts.  Avoid butter, mayonnaise, shortening, or palm kernel oils.  Avoid foods with trans fats.  Drink skim or nonfat milk and eat low-fat or nonfat yogurt and cheeses. Avoid whole milk, cream, ice cream, egg yolks, and full-fat cheeses.  Healthier desserts include angel food cake, ginger snaps, animal crackers, hard candy, popsicles, and low-fat or nonfat frozen yogurt. Avoid pastries, cakes, pies, and cookies.  Exercise  Follow your exercise program as told by your health care provider. A regular program: ? Helps to decrease LDL and raise HDL. ? Helps with weight control.  Do things that increase your activity level, such as gardening, walking, and taking the stairs.  Ask your health care provider about ways that you can be more active in your daily life.  Medicine  Take over-the-counter and prescription medicines only as told by your health care provider. ? Medicine may be prescribed by your health care provider to help lower cholesterol and decrease the risk for heart disease. This is usually done if diet and exercise have failed to bring down cholesterol levels. ? If you have several risk factors, you may need medicine even if your levels are normal.  This information is  not intended to replace advice given to you by your health care provider. Make sure you discuss any questions you have with your health care provider. Document Released: 11/06/2000 Document Revised: 09/09/2015 Document Reviewed: 08/12/2015 Elsevier Interactive Patient Education  [employer].

## 2017-08-19 NOTE — Progress Notes (Signed)
Subjective:  I acted as a Education administrator for Dr. Charlett Blake. [place], [place]  Patient ID: Olivia Hodge, female    DOB: [birth year], 67 y.o.   MRN: [id number]  Chief Complaint  Patient presents with  . Follow-up    HPI  Patient is in today for a 6 month follow up and is doing well. She did have a Melanoma removed from her left ear and she is now following with dermatology every 6 months. She is still recovering from a bad fall at her daughter's house 9 months ago which damaged her rotator cuff and fractured her humerus in right arm. She is noting a decrease in ROM and limitations of her ADLs but it is improving. No recent febrile illness or hospitalizations. Denies CP/palp/SOB/HA/congestion/fevers/GI or GU c/o. Taking meds as prescribed. She fell nine months ago at her daughter's house and injured her rotator cuff which required surgery and she fractured her humerus. She has been very limited by this injury she is following with orthopaedics Dr Jolayne Haines. Is undergoing PT and dry needling.   Patient Care Team: Mosie Lukes, MD as PCP - General (Family Medicine) Renato Shin, MD as Consulting Physician (Endocrinology) Gastroenterology, Sadie Haber as Consulting Physician   Past Medical History:  Diagnosis Date  . Allergic state 08/12/2016  . Allergy   . B12 deficiency   . History of chicken pox   . Hyperlipidemia   . Thyroid disease   . Vitamin D deficiency 05/11/2015    Past Surgical History:  Procedure Laterality Date  . APPENDECTOMY    . CATARACT EXTRACTION, BILATERAL Bilateral   . EYE SURGERY Bilateral 2018   cataracts, Dr Gillian Scarce  . SHOULDER SURGERY Right 04/10/2017   rotator cuff repair    Family History  Adopted: Yes  Problem Relation Age of Onset  . COPD Daughter   . Birth defects Daughter        recurrent bronchitis  . ADD / ADHD Son     Social History   Socioeconomic History  . Marital status: Divorced    Spouse name: Not on file  . Number of children: Not on file  . Years  of education: Not on file  . Highest education level: Not on file  Occupational History  . Not on file  Social Needs  . Financial resource strain: Not on file  . Food insecurity:    Worry: Not on file    Inability: Not on file  . Transportation needs:    Medical: Not on file    Non-medical: Not on file  Tobacco Use  . Smoking status: Former Smoker    Last attempt to quit: 02/26/1992    Years since quitting: 25.4  . Smokeless tobacco: Never Used  Substance and Sexual Activity  . Alcohol use: Yes  . Drug use: No  . 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 Physical activity:    Days per week: Not on file    Minutes per session: Not on file  . Stress: Not on file  Relationships  . Social connections:    Talks on phone: Not on file    Gets together: Not on file    Attends religious service: Not on file    Active member of club or organization: Not on file    Attends meetings of clubs or organizations: Not on file    Relationship status: Not on file  .  Intimate partner violence:    Fear of current or ex partner: Not on file    Emotionally abused: Not on file    Physically abused: Not on file    Forced sexual activity: Not on file  Other Topics Concern  . Not on file  Social History Narrative  . Not on file    Outpatient Medications Prior to Visit  Medication Sig Dispense Refill  . azelastine (ASTELIN) 0.1 % nasal spray Place 2 sprays into both nostrils 2 (two) times daily. Use in each nostril as directed 30 mL 4  . ezetimibe (ZETIA) 10 MG tablet Take 1 tablet (10 mg total) by mouth daily. 90 tablet 2  . levothyroxine (SYNTHROID, LEVOTHROID) 175 MCG tablet TAKE 1 TABLET DAILY BEFORE BREAKFAST 90 tablet 1  . loratadine (CLARITIN) 10 MG tablet Take 10 mg by mouth daily.    . NON FORMULARY Doterra supplements    . Probiotic Product (PROBIOTIC DAILY PO) Take 1 tablet by  mouth daily.    . promethazine-dextromethorphan (PROMETHAZINE-DM) 6.25-15 MG/5ML syrup Take 5 mLs by mouth 4 (four) times daily as needed for cough. 118 mL 0  . ranitidine (ZANTAC) 300 MG tablet TAKE 1 TABLET AT BEDTIME AS NEEDED FOR HEARTBURN 90 tablet 3  . sertraline (ZOLOFT) 100 MG tablet Take 1 tablet (100 mg total) by mouth daily. 90 tablet 1  . montelukast (SINGULAIR) 10 MG tablet Take 1 tablet (10 mg total) by mouth at bedtime as needed. 30 tablet 3  . tobramycin (TOBREX) 0.3 % ophthalmic solution Place 2 drops into the left eye every 6 (six) hours. 5 mL 0  . Vitamin D, Ergocalciferol, (DRISDOL) 50000 units CAPS capsule Take 1 capsule (50,000 Units total) by mouth every 7 (seven) days. 4 capsule 4   No facility-administered medications prior to visit.     Allergies  Allergen Reactions  . Penicillins Hives  . Tetracyclines & Related Nausea And Vomiting  . Erythromycin Nausea Only    Makes patient really sick    Review of Systems  Constitutional: Negative for fever and malaise/fatigue.  HENT: Negative for congestion.   Eyes: Negative for blurred vision.  Respiratory: Negative for shortness of breath.   Cardiovascular: Negative for chest pain, palpitations and leg swelling.  Gastrointestinal: Negative for abdominal pain, blood in stool and nausea.  Genitourinary: Negative for dysuria and frequency.  Musculoskeletal: Positive for back pain, joint pain and neck pain. Negative for falls.  Skin: Negative for rash.  Neurological: Negative for dizziness, loss of consciousness and headaches.  Endo/Heme/Allergies: Negative for environmental allergies.  Psychiatric/Behavioral: Negative for depression. The patient is not nervous/anxious.        Objective:    Physical Exam  Constitutional: She is oriented to person, place, and time. She appears well-developed and well-nourished. No distress.  HENT:  Head: Normocephalic and atraumatic.  Nose: Nose normal.  Eyes: Right eye exhibits  no discharge. Left eye exhibits no discharge.  Neck: Normal range of motion. Neck supple.  Cardiovascular: Normal rate and regular rhythm.  No murmur heard. Pulmonary/Chest: Effort normal and breath sounds normal.  Abdominal: Soft. Bowel sounds are normal. There is no tenderness.  Musculoskeletal: She exhibits no edema.  Decreased ROM right shoulder.   Neurological: She is alert and oriented to person, place, and time.  Skin: Skin is warm and dry.  Psychiatric: She has a normal mood and affect.  Nursing note and vitals reviewed.   BP 128/86 (BP Location: Left Arm, Patient Position: Sitting, Cuff Size: Normal)  Pulse 83   Temp 98.6 F (37 C) (Oral)   Resp 18   Wt 217 lb (98.4 kg)   SpO2 98%   BMI 32.05 kg/m  Wt Readings from Last 3 Encounters:  08/19/17 217 lb (98.4 kg)  02/10/17 217 lb 3.2 oz (98.5 kg)  08/12/16 225 lb 9.6 oz (102.3 kg)   BP Readings from Last 3 Encounters:  08/19/17 128/86  02/10/17 120/86  09/12/16 122/80     Immunization History  Administered Date(s) Administered  . Influenza, High Dose Seasonal PF 12/13/2015, 12/04/2016  . Influenza,inj,Quad PF,6+ Mos 10/24/2013  . Influenza-Unspecified 09/26/2014  . Tdap 05/25/2014    Health Maintenance  Topic Date Due  . PNA vac Low Risk Adult (1 of 2 - PCV13) 01/22/2015  . INFLUENZA VACCINE  09/25/2017  . MAMMOGRAM  10/18/2018  . COLONOSCOPY  12/22/2019  . TETANUS/TDAP  05/24/2024  . DEXA SCAN  Completed  . Hepatitis C Screening  Completed    Lab Results  Component Value Date   WBC 5.7 08/19/2017   HGB 14.1 08/19/2017   HCT 41.5 08/19/2017   PLT 214.0 08/19/2017   GLUCOSE 92 08/19/2017   CHOL 221 (H) 08/19/2017   TRIG 141.0 08/19/2017   HDL 58.80 08/19/2017   LDLDIRECT 140.0 08/12/2016   LDLCALC 134 (H) 08/19/2017   ALT 14 08/19/2017   AST 16 08/19/2017   NA 140 08/19/2017   K 4.5 08/19/2017   CL 105 08/19/2017   CREATININE 0.75 08/19/2017   BUN 14 08/19/2017   CO2 28 08/19/2017   TSH  1.55 08/19/2017   HGBA1C 5.9 08/19/2017    Lab Results  Component Value Date   TSH 1.55 08/19/2017   Lab Results  Component Value Date   WBC 5.7 08/19/2017   HGB 14.1 08/19/2017   HCT 41.5 08/19/2017   MCV 94.3 08/19/2017   PLT 214.0 08/19/2017   Lab Results  Component Value Date   NA 140 08/19/2017   K 4.5 08/19/2017   CO2 28 08/19/2017   GLUCOSE 92 08/19/2017   BUN 14 08/19/2017   CREATININE 0.75 08/19/2017   BILITOT 0.5 08/19/2017   ALKPHOS 60 08/19/2017   AST 16 08/19/2017   ALT 14 08/19/2017   PROT 6.7 08/19/2017   ALBUMIN 4.4 08/19/2017   CALCIUM 10.7 (H) 08/19/2017   ANIONGAP 8 02/16/2014   GFR 81.78 08/19/2017   Lab Results  Component Value Date   CHOL 221 (H) 08/19/2017   Lab Results  Component Value Date   HDL 58.80 08/19/2017   Lab Results  Component Value Date   LDLCALC 134 (H) 08/19/2017   Lab Results  Component Value Date   TRIG 141.0 08/19/2017   Lab Results  Component Value Date   CHOLHDL 4 08/19/2017   Lab Results  Component Value Date   HGBA1C 5.9 08/19/2017         Assessment & Plan:   Problem List Items Addressed This Visit    Hypothyroidism    On Levothyroxine, continue to monitor      Relevant Orders   TSH (Completed)   Obesity (BMI 30-39.9)    Encouraged DASH diet, decrease po intake and increase exercise as tolerated. Needs 7-8 hours of sleep nightly. Avoid trans fats, eat small, frequent meals every 4-5 hours with lean proteins, complex carbs and healthy fats. Minimize simple carbs      Hyperlipidemia    Encouraged heart healthy diet, increase exercise, avoid trans fats, consider a krill oil cap daily  does not tolerate statins      Relevant Orders   Hemoglobin A1c (Completed)   Lipid panel (Completed)   TSH (Completed)   Elevated glucose    Check hgba1c      Relevant Orders   Hemoglobin A1c (Completed)   TSH (Completed)   Comprehensive metabolic panel (Completed)   Melanoma of skin ([place])    On left  ear. Is now following with [facility].       B12 deficiency    Check level      Relevant Orders   CBC (Completed)   Vitamin B12 (Completed)   Vitamin D deficiency    Check vitamin D. Take daily supplement      Relevant Orders   VITAMIN D 25 Hydroxy (Vit-D Deficiency, Fractures) (Completed)   Right shoulder pain    She fell nine months ago at her daughter's house and injured her rotator cuff which required surgery and she fractured her humerus. She has been very limited by this injury she is following with orthopaedics Dr Jolayne Haines. Is undergoing PT and dry needling.          I have discontinued Jaeleigh B. Philbrick's montelukast, tobramycin, and Vitamin D (Ergocalciferol). I am also having her maintain her loratadine, NON FORMULARY, Probiotic Product (PROBIOTIC DAILY PO), ezetimibe, sertraline, promethazine-dextromethorphan, ranitidine, levothyroxine, and azelastine.  No orders of the defined types were placed in this encounter.   CMA served as Education administrator during this visit. History, Physical and Plan performed by medical provider. Documentation and orders reviewed and attested to.  Penni Homans, MD

## 2017-08-19 NOTE — Assessment & Plan Note (Signed)
On left ear. Is now following with [facility].

## 2017-08-19 NOTE — Assessment & Plan Note (Signed)
On Levothyroxine, continue to monitor 

## 2017-08-19 NOTE — Assessment & Plan Note (Signed)
Encouraged heart healthy diet, increase exercise, avoid trans fats, consider a krill oil cap daily does not tolerate statins

## 2017-08-19 NOTE — Assessment & Plan Note (Signed)
Check vitamin D. Take daily supplement

## 2017-08-19 NOTE — Assessment & Plan Note (Signed)
Check hgba1c 

## 2017-08-20 DIAGNOSIS — M25511 Pain in right shoulder: Secondary | ICD-10-CM | POA: Insufficient documentation

## 2017-08-20 LAB — COMPREHENSIVE METABOLIC PANEL
ALBUMIN: 4.4 g/dL (ref 3.5–5.2)
ALT: 14 U/L (ref 0–35)
AST: 16 U/L (ref 0–37)
Alkaline Phosphatase: 60 U/L (ref 39–117)
BUN: 14 mg/dL (ref 6–23)
CALCIUM: 10.7 mg/dL — AB (ref 8.4–10.5)
CHLORIDE: 105 meq/L (ref 96–112)
CO2: 28 mEq/L (ref 19–32)
CREATININE: 0.75 mg/dL (ref 0.40–1.20)
GFR: 81.78 mL/min (ref >60.00)
Glucose, Bld: 92 mg/dL (ref 70–99)
Potassium: 4.5 mEq/L (ref 3.5–5.1)
Sodium: 140 mEq/L (ref 135–145)
Total Bilirubin: 0.5 mg/dL (ref 0.2–1.2)
Total Protein: 6.7 g/dL (ref 6.0–8.3)

## 2017-08-20 LAB — VITAMIN D 25 HYDROXY (VIT D DEFICIENCY, FRACTURES): VITD: 21.88 ng/mL — AB (ref 30.00–100.00)

## 2017-08-20 LAB — HEMOGLOBIN A1C: Hgb A1c MFr Bld: 5.9 % (ref 4.6–6.5)

## 2017-08-20 LAB — LIPID PANEL
CHOL/HDL RATIO: 4
Cholesterol: 221 mg/dL — ABNORMAL HIGH (ref 0–200)
HDL: 58.8 mg/dL (ref >39.00)
LDL Cholesterol: 134 mg/dL — ABNORMAL HIGH (ref 0–99)
NONHDL: 162.22
Triglycerides: 141 mg/dL (ref 0.0–149.0)
VLDL: 28.2 mg/dL (ref 0.0–40.0)

## 2017-08-20 LAB — CBC
HEMATOCRIT: 41.5 % (ref 36.0–46.0)
HEMOGLOBIN: 14.1 g/dL (ref 12.0–15.0)
MCHC: 34 g/dL (ref 30.0–36.0)
MCV: 94.3 fl (ref 78.0–100.0)
PLATELETS: 214 10*3/uL (ref 150.0–400.0)
RBC: 4.4 Mil/uL (ref 3.87–5.11)
RDW: 14.1 % (ref 11.5–15.5)
WBC: 5.7 10*3/uL (ref 4.0–10.5)

## 2017-08-20 LAB — TSH: TSH: 1.55 u[IU]/mL (ref 0.35–4.50)

## 2017-08-20 LAB — VITAMIN B12: VITAMIN B 12: 609 pg/mL (ref 211–911)

## 2017-08-20 NOTE — Assessment & Plan Note (Addendum)
She fell nine months ago at her daughter's house and injured her rotator cuff which required surgery and she fractured her humerus. She has been very limited by this injury she is following with orthopaedics Dr Jolayne Haines. Is undergoing PT and dry needling.

## 2017-08-20 NOTE — Assessment & Plan Note (Signed)
Encouraged DASH diet, decrease po intake and increase exercise as tolerated. Needs 7-8 hours of sleep nightly. Avoid trans fats, eat small, frequent meals every 4-5 hours with lean proteins, complex carbs and healthy fats. Minimize simple carbs 

## 2017-08-22 MED ORDER — VITAMIN D (ERGOCALCIFEROL) 1.25 MG (50000 UNIT) PO CAPS: 50000.0000 [IU] | ORAL_CAPSULE | ORAL | 4 refills | Status: DC

## 2017-08-22 NOTE — Addendum Note (Signed)
Addended by: Magdalene Molly A on: 08/22/2017 04:35 PM   Modules accepted: Orders

## 2017-09-03 ENCOUNTER — Encounter: Payer: Self-pay | Admitting: Family Medicine

## 2017-09-11 ENCOUNTER — Ambulatory Visit: Payer: Medicare Other

## 2017-09-17 ENCOUNTER — Ambulatory Visit (INDEPENDENT_AMBULATORY_CARE_PROVIDER_SITE_OTHER): Payer: Medicare Other

## 2017-09-17 ENCOUNTER — Telehealth: Payer: Self-pay | Admitting: Family Medicine

## 2017-09-17 DIAGNOSIS — E538 Deficiency of other specified B group vitamins: Secondary | ICD-10-CM

## 2017-09-17 MED ORDER — VITAMIN D (ERGOCALCIFEROL) 1.25 MG (50000 UNIT) PO CAPS: 50000.0000 [IU] | ORAL_CAPSULE | ORAL | 4 refills | Status: DC

## 2017-09-17 MED ORDER — CYANOCOBALAMIN 1000 MCG/ML IJ SOLN
1000.0000 ug | Freq: Once | INTRAMUSCULAR | Status: AC
  Administered 2017-09-17: 1000 ug via INTRAMUSCULAR

## 2017-09-17 NOTE — Progress Notes (Signed)
Pre visit review using our clinic tool,if applicable. No additional management support is needed unless otherwise documented below in the visit note.   Pt here for monthly B12 injection per orders from Dr. Charlett Blake due to patient having B12 deficiency.  B12 1042mcg given IM Left deltoid per patient request, and patient  tolerated injection well.  No complaints voiced. Patient will be out of town during most of August so she will be seen again in September per her request.  Next B12 injection scheduled for October 29, 2017.

## 2017-09-17 NOTE — Telephone Encounter (Signed)
Medication sent to pharmacy as requested.

## 2017-09-17 NOTE — Telephone Encounter (Signed)
Copied from [place] [id number]. Topic: Quick Communication - See Telephone Encounter >> Sep 17, 2017  9:34 AM Rosalin Hawking wrote: CRM for notification. See Telephone encounter for: 09/17/17.   Pt requesting needing refill for Vitamin D, Ergocalciferol, (DRISDOL) 50000 units CAPS capsule sent to [facility]. Pt states has 2 wk left of her Vitamin D.  Please advise.

## 2017-09-27 ENCOUNTER — Other Ambulatory Visit: Payer: Self-pay | Admitting: Family Medicine

## 2017-09-27 DIAGNOSIS — E559 Vitamin D deficiency, unspecified: Secondary | ICD-10-CM

## 2017-09-27 DIAGNOSIS — E538 Deficiency of other specified B group vitamins: Secondary | ICD-10-CM

## 2017-09-27 DIAGNOSIS — E210 Primary hyperparathyroidism: Secondary | ICD-10-CM

## 2017-10-29 ENCOUNTER — Ambulatory Visit (INDEPENDENT_AMBULATORY_CARE_PROVIDER_SITE_OTHER): Payer: Medicare Other

## 2017-10-29 DIAGNOSIS — E538 Deficiency of other specified B group vitamins: Secondary | ICD-10-CM

## 2017-10-29 MED ORDER — CYANOCOBALAMIN 1000 MCG/ML IJ SOLN
1000.0000 ug | Freq: Once | INTRAMUSCULAR | Status: AC
  Administered 2017-10-29: 1000 ug via INTRAMUSCULAR

## 2017-10-29 NOTE — Progress Notes (Addendum)
Pre visit review using our clinic tool,if applicable. No additional management support is needed unless otherwise documented below in the visit note.   Pt here for monthly B12 injection per order from Dr. Frederik Pear.  B12 1037mcg given IM, and pt tolerated injection well.  Next B12 injection scheduled for  November 26, 2017 per patient requerst.  Kathlene November, MD

## 2017-11-17 ENCOUNTER — Other Ambulatory Visit: Payer: Self-pay | Admitting: Family Medicine

## 2017-11-17 DIAGNOSIS — Z1231 Encounter for screening mammogram for malignant neoplasm of breast: Secondary | ICD-10-CM

## 2017-11-18 ENCOUNTER — Ambulatory Visit (HOSPITAL_BASED_OUTPATIENT_CLINIC_OR_DEPARTMENT_OTHER)
Admission: RE | Admit: 2017-11-18 | Discharge: 2017-11-18 | Disposition: A | Discharge status: Home / Self Care | Payer: Medicare Other | Source: Ambulatory Visit | Attending: Family Medicine | Admitting: Family Medicine

## 2017-11-18 DIAGNOSIS — Z1231 Encounter for screening mammogram for malignant neoplasm of breast: Secondary | ICD-10-CM | POA: Diagnosis not present

## 2017-11-26 ENCOUNTER — Ambulatory Visit (INDEPENDENT_AMBULATORY_CARE_PROVIDER_SITE_OTHER): Payer: Medicare Other

## 2017-11-26 DIAGNOSIS — E538 Deficiency of other specified B group vitamins: Secondary | ICD-10-CM

## 2017-11-26 DIAGNOSIS — Z23 Encounter for immunization: Secondary | ICD-10-CM | POA: Diagnosis not present

## 2017-11-26 MED ORDER — CYANOCOBALAMIN 1000 MCG/ML IJ SOLN: 1000.0000 ug | Freq: Once | INTRAMUSCULAR | Status: DC

## 2017-11-26 MED ORDER — CYANOCOBALAMIN 1000 MCG/ML IJ SOLN
1000.0000 ug | Freq: Once | INTRAMUSCULAR | Status: AC
  Administered 2017-11-26: 1000 ug via INTRAMUSCULAR

## 2017-11-26 NOTE — Progress Notes (Signed)
Pre visit review using our clinic tool,if applicable. No additional management support is needed unless otherwise documented below in the visit note.   Pt here for monthly B12 injection per Dr. Penni Homans.  B12 1049mcg given IM right arm, and pt tolerated injection well.  Next B12 injection scheduled for 1 month. Patient given High Dose Flu immunization per her request. Left arm Patient tolerated well.

## 2017-12-31 ENCOUNTER — Ambulatory Visit (INDEPENDENT_AMBULATORY_CARE_PROVIDER_SITE_OTHER): Payer: Medicare Other

## 2017-12-31 DIAGNOSIS — E538 Deficiency of other specified B group vitamins: Secondary | ICD-10-CM | POA: Diagnosis not present

## 2017-12-31 MED ORDER — CYANOCOBALAMIN 1000 MCG/ML IJ SOLN
1000.0000 ug | Freq: Once | INTRAMUSCULAR | Status: AC
  Administered 2017-12-31: 1000 ug via INTRAMUSCULAR

## 2017-12-31 NOTE — Progress Notes (Signed)
Pre visit review using our clinic tool,if applicable. No additional management support is needed unless otherwise documented below in the visit note.   Pt here for monthly B12 injection per order from Dr. Charlett Blake.  B12 1021mcg given IM left deltoid. pt tolerated injection well.  Next B12 injection scheduled for December 4th 2019.

## 2018-01-01 ENCOUNTER — Other Ambulatory Visit: Payer: Self-pay | Admitting: Family Medicine

## 2018-01-28 ENCOUNTER — Ambulatory Visit (INDEPENDENT_AMBULATORY_CARE_PROVIDER_SITE_OTHER): Payer: Medicare Other

## 2018-01-28 DIAGNOSIS — E538 Deficiency of other specified B group vitamins: Secondary | ICD-10-CM | POA: Diagnosis not present

## 2018-01-28 MED ORDER — CYANOCOBALAMIN 1000 MCG/ML IJ SOLN
1000.0000 ug | Freq: Once | INTRAMUSCULAR | Status: AC
  Administered 2018-01-28: 1000 ug via INTRAMUSCULAR

## 2018-01-28 NOTE — Progress Notes (Signed)
Pre visit review using our clinic tool,if applicable. No additional management support is needed unless otherwise documented below in the visit note.   Pt here for monthly B12 injection per order from Dr. Penni Homans.  B12 1037mcg given IM left deltoid, and pt tolerated injection well.  Next B12 injection scheduled for 02/23/18 per patient request.

## 2018-02-12 ENCOUNTER — Encounter: Payer: Medicare Other | Admitting: Family Medicine

## 2018-02-12 ENCOUNTER — Encounter: Payer: Self-pay | Admitting: Medical

## 2018-02-12 ENCOUNTER — Ambulatory Visit (INDEPENDENT_AMBULATORY_CARE_PROVIDER_SITE_OTHER): Payer: Medicare Other | Admitting: Medical

## 2018-02-12 ENCOUNTER — Encounter: Payer: Medicare Other | Admitting: Medical

## 2018-02-12 VITALS — BP 135/66 | HR 99 | Temp 98.6°F | Resp 16 | Ht 69.0 in | Wt 220.4 lb

## 2018-02-12 DIAGNOSIS — E559 Vitamin D deficiency, unspecified: Secondary | ICD-10-CM

## 2018-02-12 DIAGNOSIS — E039 Hypothyroidism, unspecified: Secondary | ICD-10-CM

## 2018-02-12 DIAGNOSIS — M25511 Pain in right shoulder: Secondary | ICD-10-CM

## 2018-02-12 DIAGNOSIS — R739 Hyperglycemia, unspecified: Secondary | ICD-10-CM

## 2018-02-12 DIAGNOSIS — E782 Mixed hyperlipidemia: Secondary | ICD-10-CM | POA: Diagnosis not present

## 2018-02-12 DIAGNOSIS — K219 Gastro-esophageal reflux disease without esophagitis: Secondary | ICD-10-CM

## 2018-02-12 DIAGNOSIS — G8929 Other chronic pain: Secondary | ICD-10-CM

## 2018-02-12 DIAGNOSIS — E210 Primary hyperparathyroidism: Secondary | ICD-10-CM | POA: Diagnosis not present

## 2018-02-12 NOTE — Progress Notes (Signed)
Subjective:    Patient ID: Olivia Hodge, female    DOB: [birth year], 67 y.o.   MRN: [id number]  HPI  Pt in for annual check up.(medicare pt)  Pt has low thyroid. She is on synthroid.  She also has high cholesterol. Pt is on zetia. Pt does not use statin.  Pt gerd/reflux is controlled. Has been on zantac. She states symptoms controlled.  She also reports some history of rt shoulder pain/torn rotator cuff. Pt still has pain and has been going through therapy. Pt most recently ortho gave her a prescription. Pt has been given cyclobenzprene. She is taking it twice a day. She is concerned since appears insurance required prior authorization. Pt states significant decrease range of motion but better recenlty. No adverse side effects with flexeril.  Pt has hx of low vitamin D.   Pt states she has had high calcium. Pt PTH was high.      Review of Systems  Constitutional: Negative for chills, fatigue and fever.  HENT: Negative for congestion and drooling.   Respiratory: Negative for cough, chest tightness, shortness of breath and wheezing.   Cardiovascular: Negative for chest pain and palpitations.  Gastrointestinal: Negative for abdominal pain, constipation, nausea and vomiting.  Genitourinary: Negative for difficulty urinating and dysuria.  Musculoskeletal: Negative for back pain.       Rt shoulder pain.  Neurological: Negative for dizziness, seizures, speech difficulty, weakness and headaches.  Hematological: Negative for adenopathy. Does not bruise/bleed easily.  Psychiatric/Behavioral: Negative for behavioral problems and confusion.    Past Medical History:  Diagnosis Date  . Allergic state 08/12/2016  . Allergy   . B12 deficiency   . History of chicken pox   . Hyperlipidemia   . Melanoma of skin ([place])   . Thyroid disease   . Vitamin D deficiency 05/11/2015     Social History   Socioeconomic History  . Marital status: Divorced    Spouse name: Not on file  . Number  of children: Not on file  . Years of education: Not on file  . Highest education level: Not on file  Occupational History  . Not on file  Social Needs  . Financial resource strain: Not on file  . Food insecurity:    Worry: Not on file    Inability: Not on file  . Transportation needs:    Medical: Not on file    Non-medical: Not on file  Tobacco Use  . Smoking status: Former Smoker    Last attempt to quit: 02/26/1992    Years since quitting: 25.9  . Smokeless tobacco: Never Used  Substance and Sexual Activity  . Alcohol use: Yes  . Drug use: No  . 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 Physical activity:    Days per week: Not on file    Minutes per session: Not on file  . Stress: Not on file  Relationships  . Social connections:    Talks on phone: Not on file    Gets together: Not on file    Attends religious service: Not on file    Active member of club or organization: Not on file    Attends meetings of clubs or organizations: Not on file    Relationship status: Not on file  . Intimate partner violence:    Fear of current or ex partner: Not on file  Emotionally abused: Not on file    Physically abused: Not on file    Forced sexual activity: Not on file  Other Topics Concern  . Not on file  Social History Narrative  . Not on file    Past Surgical History:  Procedure Laterality Date  . APPENDECTOMY    . CATARACT EXTRACTION, BILATERAL Bilateral   . EYE SURGERY Bilateral 2018   cataracts, Dr Gillian Scarce  . SHOULDER SURGERY Right 04/10/2017   rotator cuff repair    Family History  Adopted: Yes  Problem Relation Age of Onset  . COPD Daughter   . Birth defects Daughter        recurrent bronchitis  . ADD / ADHD Son     Allergies  Allergen Reactions  . Penicillins Hives  . Tetracyclines & Related Nausea And Vomiting  . Erythromycin Nausea Only    Makes  patient really sick    Current Outpatient Medications on File Prior to Visit  Medication Sig Dispense Refill  . azelastine (ASTELIN) 0.1 % nasal spray Place 2 sprays into both nostrils 2 (two) times daily. Use in each nostril as directed 30 mL 4  . cyclobenzaprine (FLEXERIL) 10 MG tablet Take 10 mg by mouth 2 (two) times daily.    Marland Kitchen ezetimibe (ZETIA) 10 MG tablet Take 1 tablet (10 mg total) by mouth daily. 90 tablet 2  . levothyroxine (SYNTHROID, LEVOTHROID) 175 MCG tablet TAKE 1 TABLET DAILY BEFORE BREAKFAST 90 tablet 0  . loratadine (CLARITIN) 10 MG tablet Take 10 mg by mouth daily.    . NON FORMULARY Doterra supplements    . Probiotic Product (PROBIOTIC DAILY PO) Take 1 tablet by mouth daily.    . ranitidine (ZANTAC) 300 MG tablet TAKE 1 TABLET AT BEDTIME AS NEEDED FOR HEARTBURN 90 tablet 3  . sertraline (ZOLOFT) 100 MG tablet TAKE 1 TABLET DAILY 90 tablet 1  . Vitamin D, Ergocalciferol, (DRISDOL) 50000 units CAPS capsule Take 1 capsule (50,000 Units total) by mouth every 7 (seven) days. 4 capsule 4  . promethazine-dextromethorphan (PROMETHAZINE-DM) 6.25-15 MG/5ML syrup Take 5 mLs by mouth 4 (four) times daily as needed for cough. (Patient not taking: Reported on 02/12/2018) 118 mL 0   No current facility-administered medications on file prior to visit.     BP 135/66 (BP Location: Left Arm, Patient Position: Sitting, Cuff Size: Small)   Pulse 99   Temp 98.6 F (37 C) (Oral)   Resp 16   Ht 5\' 9"  (1.753 m)   Wt 220 lb 6 oz (100 kg)   SpO2 95%   BMI 32.54 kg/m       Objective:   Physical Exam   General Mental Status- Alert. General Appearance- Not in acute distress.   Skin Scattered small moles throughout. No worrisome lesions(Pt folllowed by dermatologist)  Neck Carotid Arteries- Normal color. Moisture- Normal Moisture. No carotid bruits. No JVD.  Chest and Lung Exam Auscultation: Breath Sounds:-Normal.  Cardiovascular Auscultation:Rythm- Regular. Murmurs & Other  Heart Sounds:Auscultation of the heart reveals- No Murmurs.  Abdomen Inspection:-Inspeection Normal. Palpation/Percussion:Note:No mass. Palpation and Percussion of the abdomen reveal- Non Tender, Non Distended + BS, no rebound or guarding.   Neurologic Cranial Nerve exam:- CN III-XII intact(No nystagmus), symmetric smile. Strength:- 5/5 equal and symmetric strength both upper and lower extremities.     Assessment & Plan:  For history of high cholesterol, I placed future lipid panel.  For hypothyroidism and history of hyper parathyroidism on chart review, I placed future TSH and  PTH.  Will get the metabolic panel as well and follow calcium level.  For low vitamin D history, placed a future vitamin D labs.  Glad to hear that your GERD/reflux is well controlled.  Might need to switch to cimetidine or famotidine after you finish current Zantac.  We discussed recent issues with Zantac.  For chronic right shoulder pain, I do think Flexeril is reasonable.  It seems to have helped a lot but caution on sedation or dizziness.  For mild elevated blood sugar in the past, did place future A1c.  Please get the labs scheduled on the day that you get the B12 level level done.  Follow-up date to be determined after lab review.  Mackie Pai, PA-C

## 2018-02-12 NOTE — Patient Instructions (Signed)
For history of high cholesterol, I placed future lipid panel.  For hypothyroidism and history of hyper parathyroidism on chart review, I placed future TSH and PTH.  Will get the metabolic panel as well and follow calcium level.  For low vitamin D history, placed a future vitamin D labs.  Glad to hear that your GERD/reflux is well controlled.  Might need to switch to cimetidine or famotidine after you finish current Zantac.  We discussed recent issues with Zantac.  For chronic right shoulder pain, I do think Flexeril is reasonable.  It seems to have helped a lot but caution on sedation or dizziness.  For mild elevated blood sugar in the past, did place future A1c.  Please get the labs scheduled on the day that you get the B12 level level done.  Follow-up date to be determined after lab review.

## 2018-02-12 NOTE — Progress Notes (Signed)
Pre visit review using our clinic review tool, if applicable. No additional management support is needed unless otherwise documented below in the visit note. 

## 2018-02-13 ENCOUNTER — Encounter: Payer: Medicare Other | Admitting: Family

## 2018-02-23 ENCOUNTER — Ambulatory Visit (INDEPENDENT_AMBULATORY_CARE_PROVIDER_SITE_OTHER): Payer: Medicare Other

## 2018-02-23 ENCOUNTER — Other Ambulatory Visit (INDEPENDENT_AMBULATORY_CARE_PROVIDER_SITE_OTHER): Payer: Medicare Other

## 2018-02-23 DIAGNOSIS — R739 Hyperglycemia, unspecified: Secondary | ICD-10-CM

## 2018-02-23 DIAGNOSIS — E782 Mixed hyperlipidemia: Secondary | ICD-10-CM | POA: Diagnosis not present

## 2018-02-23 DIAGNOSIS — E210 Primary hyperparathyroidism: Secondary | ICD-10-CM

## 2018-02-23 DIAGNOSIS — E039 Hypothyroidism, unspecified: Secondary | ICD-10-CM

## 2018-02-23 DIAGNOSIS — E559 Vitamin D deficiency, unspecified: Secondary | ICD-10-CM | POA: Diagnosis not present

## 2018-02-23 LAB — COMPREHENSIVE METABOLIC PANEL
ALBUMIN: 4.3 g/dL (ref 3.5–5.2)
ALT: 16 U/L (ref 0–35)
AST: 18 U/L (ref 0–37)
Alkaline Phosphatase: 62 U/L (ref 39–117)
BUN: 16 mg/dL (ref 6–23)
CHLORIDE: 105 meq/L (ref 96–112)
CO2: 29 mEq/L (ref 19–32)
CREATININE: 0.92 mg/dL (ref 0.40–1.20)
Calcium: 9.9 mg/dL (ref 8.4–10.5)
GFR: 64.5 mL/min (ref >60.00)
Glucose, Bld: 113 mg/dL — ABNORMAL HIGH (ref 70–99)
Potassium: 4.3 mEq/L (ref 3.5–5.1)
SODIUM: 140 meq/L (ref 135–145)
TOTAL PROTEIN: 6.6 g/dL (ref 6.0–8.3)
Total Bilirubin: 0.5 mg/dL (ref 0.2–1.2)

## 2018-02-23 LAB — LIPID PANEL
CHOLESTEROL: 204 mg/dL — AB (ref 0–200)
HDL: 54.2 mg/dL (ref >39.00)
LDL CALC: 124 mg/dL — AB (ref 0–99)
NonHDL: 149.72
Total CHOL/HDL Ratio: 4
Triglycerides: 131 mg/dL (ref 0.0–149.0)
VLDL: 26.2 mg/dL (ref 0.0–40.0)

## 2018-02-23 LAB — TSH: TSH: 4.46 u[IU]/mL (ref 0.35–4.50)

## 2018-02-23 LAB — HEMOGLOBIN A1C: HEMOGLOBIN A1C: 5.9 % (ref 4.6–6.5)

## 2018-02-23 MED ORDER — CYANOCOBALAMIN 1000 MCG/ML IJ SOLN
1000.0000 ug | Freq: Once | INTRAMUSCULAR | Status: AC
  Administered 2018-02-23: 1000 ug via INTRAMUSCULAR

## 2018-02-23 NOTE — Progress Notes (Signed)
Nursing note reviewed. Agree with documention and plan.  

## 2018-02-23 NOTE — Progress Notes (Signed)
Pre visit review using our clinic tool,if applicable. No additional management support is needed unless otherwise documented below in the visit note.   Pt here for monthly B12 injection per   B12 1057mcg given IM left deltoid, and pt tolerated injection well. No complaints voiced.  Next B12 injection scheduled for January 29,2020.

## 2018-02-24 ENCOUNTER — Ambulatory Visit: Payer: Medicare Other

## 2018-02-25 ENCOUNTER — Telehealth: Payer: Self-pay | Admitting: Medical

## 2018-02-25 DIAGNOSIS — R7989 Other specified abnormal findings of blood chemistry: Secondary | ICD-10-CM

## 2018-02-25 NOTE — Telephone Encounter (Signed)
Referral to endocrinologist placed. 

## 2018-02-27 LAB — VITAMIN D 1,25 DIHYDROXY
Vitamin D 1, 25 (OH)2 Total: 70 pg/mL (ref 18–72)
Vitamin D2 1, 25 (OH)2: 11 pg/mL
Vitamin D3 1, 25 (OH)2: 59 pg/mL

## 2018-02-27 LAB — PARATHYROID HORMONE, INTACT (NO CA): PTH: 138 pg/mL — ABNORMAL HIGH (ref 14–64)

## 2018-03-11 ENCOUNTER — Encounter: Payer: Self-pay | Admitting: Medical

## 2018-03-11 ENCOUNTER — Telehealth: Payer: Self-pay | Admitting: Medical

## 2018-03-11 NOTE — Telephone Encounter (Signed)
I put in referral to endocrinologist. I don't think I specified which one. She wants to see [facility](she just informed me by my chart). She prefers not to see Dr. Loanne Drilling. Can you re-refer her. Please let me know if that is a problem?

## 2018-03-12 ENCOUNTER — Other Ambulatory Visit: Payer: Self-pay | Admitting: Family Medicine

## 2018-03-18 ENCOUNTER — Encounter: Payer: Self-pay | Admitting: Internal Medicine

## 2018-03-18 ENCOUNTER — Telehealth: Payer: Self-pay | Admitting: Medical

## 2018-03-18 DIAGNOSIS — R7989 Other specified abnormal findings of blood chemistry: Secondary | ICD-10-CM

## 2018-03-18 NOTE — Telephone Encounter (Signed)
Opened to refer. 

## 2018-03-18 NOTE — Telephone Encounter (Signed)
New referral to endocrinologist placed.

## 2018-03-24 ENCOUNTER — Other Ambulatory Visit: Payer: Self-pay | Admitting: Family Medicine

## 2018-03-24 DIAGNOSIS — E538 Deficiency of other specified B group vitamins: Secondary | ICD-10-CM

## 2018-03-24 DIAGNOSIS — E039 Hypothyroidism, unspecified: Secondary | ICD-10-CM

## 2018-03-24 DIAGNOSIS — E210 Primary hyperparathyroidism: Secondary | ICD-10-CM

## 2018-03-24 DIAGNOSIS — E559 Vitamin D deficiency, unspecified: Secondary | ICD-10-CM

## 2018-03-24 DIAGNOSIS — E785 Hyperlipidemia, unspecified: Secondary | ICD-10-CM

## 2018-03-25 ENCOUNTER — Ambulatory Visit (INDEPENDENT_AMBULATORY_CARE_PROVIDER_SITE_OTHER): Payer: Medicare Other

## 2018-03-25 DIAGNOSIS — E538 Deficiency of other specified B group vitamins: Secondary | ICD-10-CM

## 2018-03-25 MED ORDER — CYANOCOBALAMIN 1000 MCG/ML IJ SOLN
1000.0000 ug | Freq: Once | INTRAMUSCULAR | Status: AC
  Administered 2018-03-25: 1000 ug via INTRAMUSCULAR

## 2018-03-25 NOTE — Progress Notes (Addendum)
Pt here today for monthly B12 injection.   103mL of cyanocobalamin 1098mcg/mL injected into L deltoid. Pt tolerated injection well.   Pt is going out of town on 04/19/2018- next B12 in 5 weeks on 04/29/2018 at 0930.   Kathlene November, MD

## 2018-03-28 ENCOUNTER — Other Ambulatory Visit: Payer: Self-pay | Admitting: Family Medicine

## 2018-03-28 DIAGNOSIS — E538 Deficiency of other specified B group vitamins: Secondary | ICD-10-CM

## 2018-03-28 DIAGNOSIS — E210 Primary hyperparathyroidism: Secondary | ICD-10-CM

## 2018-03-28 DIAGNOSIS — E559 Vitamin D deficiency, unspecified: Secondary | ICD-10-CM

## 2018-04-08 ENCOUNTER — Other Ambulatory Visit: Payer: Self-pay | Admitting: Family Medicine

## 2018-04-10 ENCOUNTER — Other Ambulatory Visit: Payer: Self-pay | Admitting: Family Medicine

## 2018-04-29 ENCOUNTER — Ambulatory Visit (INDEPENDENT_AMBULATORY_CARE_PROVIDER_SITE_OTHER): Payer: Medicare Other | Admitting: *Deleted

## 2018-04-29 DIAGNOSIS — E538 Deficiency of other specified B group vitamins: Secondary | ICD-10-CM

## 2018-04-29 MED ORDER — CYANOCOBALAMIN 1000 MCG/ML IJ SOLN
1000.0000 ug | Freq: Once | INTRAMUSCULAR | Status: AC
  Administered 2018-04-29: 1000 ug via INTRAMUSCULAR

## 2018-04-29 NOTE — Progress Notes (Signed)
Pt here today for monthly B12 injection.   58mL of cyanocobalamin 1076mcg/mL injected into L deltoid. Pt tolerated injection well.

## 2018-06-03 ENCOUNTER — Ambulatory Visit (INDEPENDENT_AMBULATORY_CARE_PROVIDER_SITE_OTHER): Payer: Medicare Other

## 2018-06-03 ENCOUNTER — Other Ambulatory Visit: Payer: Self-pay

## 2018-06-03 ENCOUNTER — Telehealth: Payer: Self-pay

## 2018-06-03 DIAGNOSIS — E538 Deficiency of other specified B group vitamins: Secondary | ICD-10-CM | POA: Diagnosis not present

## 2018-06-03 MED ORDER — CYANOCOBALAMIN 1000 MCG/ML IJ SOLN
1000.0000 ug | Freq: Once | INTRAMUSCULAR | Status: AC
  Administered 2018-06-03: 12:00:00 1000 ug via INTRAMUSCULAR

## 2018-06-03 MED ORDER — CYANOCOBALAMIN 500 MCG SL SUBL: 500.0000 ug | SUBLINGUAL_TABLET | Freq: Every day | SUBLINGUAL | 1 refills | Status: DC

## 2018-06-03 NOTE — Progress Notes (Signed)
Reviewed  Olivia Hodge R Lowne Chase, DO  

## 2018-06-03 NOTE — Progress Notes (Signed)
Pre visit review using our clinic tool,if applicable. No additional management support is needed unless otherwise documented below in the visit note.   Pt here for monthly B12 injection per   B12 1085mcg given IM, and pt tolerated injection well.  Ordered SL B12 500 mg. Patient will start daily for 1 month then she will return for B12 injections.Due to Stay at home orders)

## 2018-06-03 NOTE — Telephone Encounter (Signed)
Called and talked to patient. Advised there were not seeing many patients and that there would be little to now patients in the waiting room.   Patient came in given B12 injection.

## 2018-06-03 NOTE — Telephone Encounter (Signed)
Copied from [place] [phone]. Topic: General - Inquiry >> Jun 01, 2018  3:46 PM Selinda Flavin B, NT wrote: Reason for CRM: Patient calling and states that she has a B12 injection scheduled for 06/03/2018 at 9:45am. Would like to know if it is possible for the nurse to come down to her car and give that injection? Please advise.

## 2018-06-03 NOTE — Telephone Encounter (Signed)
I do not believe the [facility] will let us do that. Any thoughts? She can come in with a mask and get the shot and leave or could we send her to another office who is allowed to do car visits?

## 2018-06-04 NOTE — Telephone Encounter (Signed)
thanks

## 2018-07-13 ENCOUNTER — Ambulatory Visit: Payer: Self-pay | Admitting: Family Medicine

## 2018-07-13 ENCOUNTER — Ambulatory Visit (INDEPENDENT_AMBULATORY_CARE_PROVIDER_SITE_OTHER): Payer: Medicare Other | Admitting: Medical

## 2018-07-13 ENCOUNTER — Encounter: Payer: Self-pay | Admitting: Medical

## 2018-07-13 ENCOUNTER — Other Ambulatory Visit: Payer: Self-pay

## 2018-07-13 VITALS — BP 139/93 | HR 96 | Wt 220.0 lb

## 2018-07-13 DIAGNOSIS — M79662 Pain in left lower leg: Secondary | ICD-10-CM | POA: Diagnosis not present

## 2018-07-13 DIAGNOSIS — M79661 Pain in right lower leg: Secondary | ICD-10-CM

## 2018-07-13 DIAGNOSIS — O26899 Other specified pregnancy related conditions, unspecified trimester: Secondary | ICD-10-CM

## 2018-07-13 DIAGNOSIS — O280 Abnormal hematological finding on antenatal screening of mother: Secondary | ICD-10-CM

## 2018-07-13 MED ORDER — TIZANIDINE HCL 4 MG PO TABS: 4.0000 mg | ORAL_TABLET | Freq: Every day | ORAL | 0 refills | Status: DC

## 2018-07-13 NOTE — Telephone Encounter (Signed)
  I returned pt's call.   She is c/o throbbing in her legs at night that started 3-4 nights ago.   It wakes her up at night.  No swelling, redness or wounds.  I warm transferred her call to Elnita Maxwell in [place] office to be scheduled for a virtual visit due to the COVID-19 pandemic.  I sent my triage notes to the office.   Reason for Disposition . Leg pain or muscle cramp is a chronic symptom (recurrent or ongoing AND present > 4 weeks)    Throbbing in both legs for the past 3-4 nights  Answer Assessment - Initial Assessment Questions 1. ONSET: "When did the pain start?"      The last 3-4 nights my legs are throbbing.   I've never had that before. 2. LOCATION: "Where is the pain located?"      It began in lower leg calf and last night it's in my thighs both legs. 3. PAIN: "How bad is the pain?"    (Scale 1-10; or mild, moderate, severe)   -  MILD (1-3): doesn't interfere with normal activities    -  MODERATE (4-7): interferes with normal activities (e.g., work or school) or awakens from sleep, limping    -  SEVERE (8-10): excruciating pain, unable to do any normal activities, unable to walk     This is disruptive and it hurts both legs.   4. WORK OR EXERCISE: "Has there been any recent work or exercise that involved this part of the body?"      No     5. CAUSE: "What do you think is causing the leg pain?"     No idea.  I've never had anything like this before.   I'm seeing my chiropractor.  6. OTHER SYMPTOMS: "Do you have any other symptoms?" (e.g., chest pain, back pain, breathing difficulty, swelling, rash, fever, numbness, weakness)     No 7. PREGNANCY: "Is there any chance you are pregnant?" "When was your last menstrual period?"     Not asked due to age  Protocols used: LEG PAIN-A-AH

## 2018-07-13 NOTE — Patient Instructions (Addendum)
You appear to have muscle pain in calf and hamstring muscles. Considered pain from back or restless legs but your described symptoms don't match.  Will advise over the counter 1 alleve tablet at night along with tinazadine 4 mg tab at night.  Will get b12 in September when in for routine follow up.  I want you to update me 7 days as to if bilateral lower ext pain resolved. If pain worsens or changes notify me sooner. If pain not better would get cmp and magnesium levels to check electrolytes. If any asymetric calf swelling would get [place] or if develop any popliteal pain.

## 2018-07-13 NOTE — Telephone Encounter (Signed)
Virtual visit scheduled.  

## 2018-07-13 NOTE — Progress Notes (Signed)
   Subjective:    Patient ID: Olivia Hodge, female    DOB: 04/24/1950, 68 y.o.   MRN: [id number]  HPI  Virtual Visit via Video Note  I connected with Burlene Arnt on 07/13/18 at  3:40 PM EDT by a video enabled telemedicine application and verified that I am speaking with the correct person using two identifiers.  Location: Patient: home Provider: office.   I discussed the limitations of evaluation and management by telemedicine and the availability of in person appointments. The patient expressed understanding and agreed to proceed.  History of Present Illness:  Pt states 3-4 days ago she started to have pain in her legs. She states she had throbbing type pain to her legs at night. She was also not sleeping well.  Feels like in hamstring and calf muscle. Not occurring during the day.  Pt states no back pain. Pt daughter is Art therapist and she gave Serrina a manipulation. Pt denies any cramping pain. She states throbbing sensation one night was in calf and in hamstring area.  Pt has been walking recently. Around 2 miles.     Observations/Objective: General- no acute distress.  Lungs- appears unlabored. Neuro- gross motor function are intact.  Legs- states pain in calfs and hamstrings at night. No calf swelling on exam. Both appear symmetric.   Assessment and Plan: You appear to have muscle pain in calf and hamstring muscles. Considered pain from back or restless legs but your described symptoms don't match.  Will advise over the counter 1 alleve tablet at night along with tinazadine 4 mg tab at night.  Will get b12 in September when in for routine follow up. I want you to update me 7 days as to if bilateral lower ext pain resolved. If pain worsens or changes notify me sooner. If pain not better would get cmp and magnesium levels to check electrolytes. If any asymetric calf swelling would get [place] or if develop any popliteal pain.  Follow Up Instructions:    I discussed the  assessment and treatment plan with the patient. The patient was provided an opportunity to ask questions and all were answered. The patient agreed with the plan and demonstrated an understanding of the instructions.   The patient was advised to call back or seek an in-person evaluation if the symptoms worsen or if the condition fails to improve as anticipated.  25 minutes spent with pt. 50% of time spent with pt explaining plan going forward and answering questions.   Mackie Pai, PA-C    Review of Systems  Constitutional: Negative for chills, fatigue and fever.  HENT: Negative for dental problem and ear pain.   Respiratory: Negative for cough, chest tightness, shortness of breath and wheezing.   Cardiovascular: Negative for chest pain and palpitations.  Gastrointestinal: Negative for abdominal pain.  Musculoskeletal: Positive for myalgias. Negative for back pain, neck pain and neck stiffness.       See hpi.  Neurological: Negative for dizziness and headaches.  Hematological: Negative for adenopathy. Does not bruise/bleed easily.  Psychiatric/Behavioral: Negative for behavioral problems and confusion.       Objective:   Physical Exam        Assessment & Plan:

## 2018-07-14 ENCOUNTER — Telehealth: Payer: Self-pay | Admitting: Family Medicine

## 2018-07-14 NOTE — Telephone Encounter (Signed)
LVM for patient to call back to schedule b12 injection and fasting labs in the first week of September.

## 2018-07-22 ENCOUNTER — Telehealth: Payer: Self-pay | Admitting: Medical

## 2018-07-22 ENCOUNTER — Other Ambulatory Visit: Payer: Self-pay

## 2018-07-22 ENCOUNTER — Encounter: Payer: Self-pay | Admitting: Medical

## 2018-07-22 MED ORDER — AZELASTINE HCL 0.1 % NA SOLN: NASAL | 4 refills | Status: DC

## 2018-07-22 NOTE — Telephone Encounter (Signed)
Refilled astelin rx.

## 2018-10-28 ENCOUNTER — Ambulatory Visit (INDEPENDENT_AMBULATORY_CARE_PROVIDER_SITE_OTHER): Payer: Medicare Other

## 2018-10-28 ENCOUNTER — Other Ambulatory Visit: Payer: Self-pay | Admitting: Emergency Medicine

## 2018-10-28 ENCOUNTER — Encounter: Payer: Self-pay | Admitting: Family Medicine

## 2018-10-28 ENCOUNTER — Other Ambulatory Visit (HOSPITAL_BASED_OUTPATIENT_CLINIC_OR_DEPARTMENT_OTHER): Payer: Self-pay | Admitting: Medical

## 2018-10-28 ENCOUNTER — Other Ambulatory Visit (INDEPENDENT_AMBULATORY_CARE_PROVIDER_SITE_OTHER): Payer: Medicare Other

## 2018-10-28 ENCOUNTER — Other Ambulatory Visit: Payer: Self-pay

## 2018-10-28 DIAGNOSIS — E538 Deficiency of other specified B group vitamins: Secondary | ICD-10-CM

## 2018-10-28 DIAGNOSIS — E039 Hypothyroidism, unspecified: Secondary | ICD-10-CM

## 2018-10-28 DIAGNOSIS — R7309 Other abnormal glucose: Secondary | ICD-10-CM

## 2018-10-28 DIAGNOSIS — Z23 Encounter for immunization: Secondary | ICD-10-CM

## 2018-10-28 DIAGNOSIS — E782 Mixed hyperlipidemia: Secondary | ICD-10-CM | POA: Diagnosis not present

## 2018-10-28 DIAGNOSIS — Z1231 Encounter for screening mammogram for malignant neoplasm of breast: Secondary | ICD-10-CM

## 2018-10-28 LAB — COMPREHENSIVE METABOLIC PANEL
ALT: 16 U/L (ref 0–35)
AST: 18 U/L (ref 0–37)
Albumin: 4 g/dL (ref 3.5–5.2)
Alkaline Phosphatase: 61 U/L (ref 39–117)
BUN: 15 mg/dL (ref 6–23)
CO2: 28 mEq/L (ref 19–32)
Calcium: 9.9 mg/dL (ref 8.4–10.5)
Chloride: 106 mEq/L (ref 96–112)
Creatinine, Ser: 0.83 mg/dL (ref 0.40–1.20)
GFR: 68.21 mL/min (ref >60.00)
Glucose, Bld: 106 mg/dL — ABNORMAL HIGH (ref 70–99)
Potassium: 4.4 mEq/L (ref 3.5–5.1)
Sodium: 141 mEq/L (ref 135–145)
Total Bilirubin: 0.7 mg/dL (ref 0.2–1.2)
Total Protein: 6.4 g/dL (ref 6.0–8.3)

## 2018-10-28 LAB — TSH: TSH: 0.42 u[IU]/mL (ref 0.35–4.50)

## 2018-10-28 LAB — VITAMIN B12: Vitamin B-12: >1500 pg/mL — ABNORMAL HIGH (ref 211–911)

## 2018-10-28 LAB — CBC
HCT: 39.6 % (ref 36.0–46.0)
Hemoglobin: 13.2 g/dL (ref 12.0–15.0)
MCHC: 33.3 g/dL (ref 30.0–36.0)
MCV: 93.9 fl (ref 78.0–100.0)
Platelets: 196 10*3/uL (ref 150.0–400.0)
RBC: 4.21 Mil/uL (ref 3.87–5.11)
RDW: 13.3 % (ref 11.5–15.5)
WBC: 4.7 10*3/uL (ref 4.0–10.5)

## 2018-10-28 LAB — LIPID PANEL
Cholesterol: 197 mg/dL (ref 0–200)
HDL: 48.3 mg/dL (ref >39.00)
LDL Cholesterol: 124 mg/dL — ABNORMAL HIGH (ref 0–99)
NonHDL: 149.19
Total CHOL/HDL Ratio: 4
Triglycerides: 127 mg/dL (ref 0.0–149.0)
VLDL: 25.4 mg/dL (ref 0.0–40.0)

## 2018-10-28 LAB — HEMOGLOBIN A1C: Hgb A1c MFr Bld: 5.8 % (ref 4.6–6.5)

## 2018-10-28 MED ORDER — CYANOCOBALAMIN 1000 MCG/ML IJ SOLN
1000.0000 ug | Freq: Once | INTRAMUSCULAR | Status: AC
  Administered 2018-10-28: 1000 ug via INTRAMUSCULAR

## 2018-10-28 NOTE — Progress Notes (Addendum)
Patient came in today to have her B-12 and her Flu shot per Dr. Charlett Blake. Patient tolerated both injections with no complications.   Agree with administration of b12 injection  and flu vaccine.  Mackie Pai, PA-C

## 2018-10-28 NOTE — Progress Notes (Signed)
vit

## 2018-11-04 ENCOUNTER — Ambulatory Visit: Payer: Medicare Other

## 2018-11-04 ENCOUNTER — Other Ambulatory Visit: Payer: Medicare Other

## 2018-11-25 ENCOUNTER — Other Ambulatory Visit: Payer: Self-pay

## 2018-11-25 ENCOUNTER — Ambulatory Visit (HOSPITAL_BASED_OUTPATIENT_CLINIC_OR_DEPARTMENT_OTHER)
Admission: RE | Admit: 2018-11-25 | Discharge: 2018-11-25 | Disposition: A | Discharge status: Home / Self Care | Payer: Medicare Other | Source: Ambulatory Visit | Attending: Medical | Admitting: Medical

## 2018-11-25 ENCOUNTER — Ambulatory Visit (INDEPENDENT_AMBULATORY_CARE_PROVIDER_SITE_OTHER): Payer: Medicare Other | Admitting: *Deleted

## 2018-11-25 DIAGNOSIS — Z1231 Encounter for screening mammogram for malignant neoplasm of breast: Secondary | ICD-10-CM | POA: Diagnosis present

## 2018-11-25 DIAGNOSIS — E538 Deficiency of other specified B group vitamins: Secondary | ICD-10-CM

## 2018-11-25 MED ORDER — CYANOCOBALAMIN 1000 MCG/ML IJ SOLN
1000.0000 ug | Freq: Once | INTRAMUSCULAR | Status: AC
  Administered 2018-11-25: 10:00:00 1000 ug via INTRAMUSCULAR

## 2018-11-25 NOTE — Progress Notes (Signed)
Pt here for monthly B12 injection per Dr. Charlett Blake.  B12 1010mcg given in left deltoid and pt tolerated injection well.  Appointment scheduled for next injection

## 2018-12-30 ENCOUNTER — Ambulatory Visit (INDEPENDENT_AMBULATORY_CARE_PROVIDER_SITE_OTHER): Payer: Medicare Other | Admitting: *Deleted

## 2018-12-30 ENCOUNTER — Other Ambulatory Visit: Payer: Self-pay

## 2018-12-30 DIAGNOSIS — E538 Deficiency of other specified B group vitamins: Secondary | ICD-10-CM

## 2018-12-30 MED ORDER — CYANOCOBALAMIN 1000 MCG/ML IJ SOLN
1000.0000 ug | Freq: Once | INTRAMUSCULAR | Status: AC
  Administered 2018-12-30: 10:00:00 1000 ug via INTRAMUSCULAR

## 2018-12-30 NOTE — Progress Notes (Addendum)
Pt here for monthly B12 injection per Dr. Charlett Blake.  B12 1091mcg given in left deltoid and pt tolerated injection well.  Appointment scheduled for next injection  Nursing note reviewed. Agree with documention and plan.

## 2019-02-03 ENCOUNTER — Ambulatory Visit (INDEPENDENT_AMBULATORY_CARE_PROVIDER_SITE_OTHER): Payer: Medicare Other

## 2019-02-03 ENCOUNTER — Other Ambulatory Visit: Payer: Self-pay

## 2019-02-03 DIAGNOSIS — E538 Deficiency of other specified B group vitamins: Secondary | ICD-10-CM | POA: Diagnosis not present

## 2019-02-03 MED ORDER — CYANOCOBALAMIN 1000 MCG/ML IJ SOLN
1000.0000 ug | INTRAMUSCULAR | Status: DC
  Administered 2019-02-03 – 2019-04-28 (×2): 1000 ug via INTRAMUSCULAR

## 2019-02-03 NOTE — Progress Notes (Signed)
Pt here for monthly B12 injection perDr. Charlett Blake.   B12 1025mcg givenin left deltoidand pt tolerated injection well.   Appointment scheduled for next injection in March. Pt states wanting to try OTC Vitamin B12. Please advise

## 2019-03-27 ENCOUNTER — Ambulatory Visit: Payer: Medicare Other

## 2019-04-01 ENCOUNTER — Ambulatory Visit: Payer: Medicare Other | Attending: Internal Medicine

## 2019-04-01 DIAGNOSIS — Z23 Encounter for immunization: Secondary | ICD-10-CM | POA: Insufficient documentation

## 2019-04-01 NOTE — Progress Notes (Signed)
   Covid-19 Vaccination Clinic  Name:  Olivia Hodge    MRN: [id number] DOB: [birth year]  04/01/2019  [name] was observed post Covid-19 immunization for 15 minutes without incidence. She was provided with Vaccine Information Sheet and instruction to access the V-Safe system.   [name] was instructed to call 911 with any severe reactions post vaccine: Marland Kitchen Difficulty breathing  . Swelling of your face and throat  . A fast heartbeat  . A bad rash all over your body  . Dizziness and weakness    Immunizations Administered    Name Date Dose VIS Date Route   Pfizer COVID-19 Vaccine 04/01/2019  9:08 AM 0.3 mL 02/05/2019 Intramuscular   Manufacturer: [place]   Lot: [id number]   [place]: [id number]

## 2019-04-07 ENCOUNTER — Ambulatory Visit: Payer: Medicare Other

## 2019-04-26 ENCOUNTER — Ambulatory Visit: Payer: Medicare Other

## 2019-04-26 ENCOUNTER — Ambulatory Visit: Payer: Medicare Other | Attending: Internal Medicine

## 2019-04-26 DIAGNOSIS — Z23 Encounter for immunization: Secondary | ICD-10-CM | POA: Insufficient documentation

## 2019-04-26 NOTE — Progress Notes (Signed)
   Covid-19 Vaccination Clinic  Name:  Olivia Hodge    MRN: [id number] DOB: 1950/04/22  04/26/2019  Olivia Hodge was observed post Covid-19 immunization for 15 minutes without incidence. She was provided with Vaccine Information Sheet and instruction to access the V-Safe system.   Olivia Hodge was instructed to call 911 with any severe reactions post vaccine: Marland Kitchen Difficulty breathing  . Swelling of your face and throat  . A fast heartbeat  . A bad rash all over your body  . Dizziness and weakness    Immunizations Administered    Name Date Dose VIS Date Route   Pfizer COVID-19 Vaccine 04/26/2019  1:49 PM 0.3 mL 02/05/2019 Intramuscular   Manufacturer: [place]   Lot: [id number]   [place]: [id number]

## 2019-04-28 ENCOUNTER — Ambulatory Visit (INDEPENDENT_AMBULATORY_CARE_PROVIDER_SITE_OTHER): Payer: Medicare Other

## 2019-04-28 ENCOUNTER — Other Ambulatory Visit: Payer: Self-pay

## 2019-04-28 DIAGNOSIS — E538 Deficiency of other specified B group vitamins: Secondary | ICD-10-CM

## 2019-04-28 NOTE — Progress Notes (Signed)
Patients last B12 level was 11/07/2018, was advised to decrease oral B12 supplement. She has alternated oral supplements and injections the last several months. Last oral dose was 2 days ago (has been taking every other day).   Pt here for monthly B12 injection per PCP order.   B12 1090mcg given IM, and pt tolerated injection well.  Patient asking for repeat B12 level in April 2021.   FYI--Pt also requesting TOC from [place] to [place] for scheduling purposes.   Gerilyn Nestle, RN

## 2019-05-06 ENCOUNTER — Other Ambulatory Visit: Payer: Self-pay

## 2019-05-07 ENCOUNTER — Ambulatory Visit (INDEPENDENT_AMBULATORY_CARE_PROVIDER_SITE_OTHER): Payer: Medicare Other | Admitting: Medical

## 2019-05-07 ENCOUNTER — Other Ambulatory Visit: Payer: Self-pay

## 2019-05-07 VITALS — BP 133/89 | HR 83 | Temp 96.2°F | Resp 18 | Ht 69.0 in | Wt 220.0 lb

## 2019-05-07 DIAGNOSIS — R7309 Other abnormal glucose: Secondary | ICD-10-CM | POA: Diagnosis not present

## 2019-05-07 DIAGNOSIS — E538 Deficiency of other specified B group vitamins: Secondary | ICD-10-CM | POA: Diagnosis not present

## 2019-05-07 DIAGNOSIS — E559 Vitamin D deficiency, unspecified: Secondary | ICD-10-CM

## 2019-05-07 DIAGNOSIS — E782 Mixed hyperlipidemia: Secondary | ICD-10-CM | POA: Diagnosis not present

## 2019-05-07 LAB — COMPREHENSIVE METABOLIC PANEL
ALT: 16 U/L (ref 0–35)
AST: 16 U/L (ref 0–37)
Albumin: 4.1 g/dL (ref 3.5–5.2)
Alkaline Phosphatase: 60 U/L (ref 39–117)
BUN: 14 mg/dL (ref 6–23)
CO2: 29 mEq/L (ref 19–32)
Calcium: 9.9 mg/dL (ref 8.4–10.5)
Chloride: 104 mEq/L (ref 96–112)
Creatinine, Ser: 0.84 mg/dL (ref 0.40–1.20)
GFR: 67.17 mL/min (ref >60.00)
Glucose, Bld: 98 mg/dL (ref 70–99)
Potassium: 4.1 mEq/L (ref 3.5–5.1)
Sodium: 139 mEq/L (ref 135–145)
Total Bilirubin: 0.7 mg/dL (ref 0.2–1.2)
Total Protein: 6.4 g/dL (ref 6.0–8.3)

## 2019-05-07 LAB — HEMOGLOBIN A1C: Hgb A1c MFr Bld: 5.7 % (ref 4.6–6.5)

## 2019-05-07 LAB — VITAMIN B12: Vitamin B-12: 1001 pg/mL — ABNORMAL HIGH (ref 211–911)

## 2019-05-07 LAB — LIPID PANEL
Cholesterol: 194 mg/dL (ref 0–200)
HDL: 49.6 mg/dL (ref >39.00)
LDL Cholesterol: 116 mg/dL — ABNORMAL HIGH (ref 0–99)
NonHDL: 144.09
Total CHOL/HDL Ratio: 4
Triglycerides: 142 mg/dL (ref 0.0–149.0)
VLDL: 28.4 mg/dL (ref 0.0–40.0)

## 2019-05-07 NOTE — Patient Instructions (Addendum)
For hx high choleserol will get lipid panel  with cmp today and continue with zetia.  For increased sugar in past will get a1c today.  For low b12 get b12 level.  For hx low vit D get level today.

## 2019-05-07 NOTE — Progress Notes (Signed)
Subjective:    Patient ID: Olivia Hodge, female    DOB: 1950/05/26, 69 y.o.   MRN: [id number]  HPI  Pt in today for medication refill.  Pt states some labs done at endocrinologist office. Stats pth being followed.  Pt had b12 shot last week. Last lab showed high b12 level but lab done on day of shot. In the past she was taking sublingual. Pt has not cut back on frequency of b12 injection.  Hx of high cholesterol and mild high sugar level. But a1c area in good range.   Vit D level low in past.  Some sore calfs at night. At banana and tonic water at night and resolved. No recent calf cramping. Last soreness or cramp one month ago.     Review of Systems  Constitutional: Negative for chills, fatigue and fever.  Respiratory: Negative for cough, chest tightness, shortness of breath and wheezing.   Cardiovascular: Negative for chest pain and palpitations.  Gastrointestinal: Negative for abdominal pain.  Musculoskeletal: Negative for back pain and myalgias.  Neurological: Negative for dizziness, weakness, numbness and headaches.  Hematological: Negative for adenopathy. Does not bruise/bleed easily.  Psychiatric/Behavioral: Negative for behavioral problems, confusion and sleep disturbance. The patient is not nervous/anxious.     Past Medical History:  Diagnosis Date  . Allergic state 08/12/2016  . Allergy   . B12 deficiency   . History of chicken pox   . Hyperlipidemia   . Melanoma of skin ([place])   . Thyroid disease   . Vitamin D deficiency 05/11/2015     Social History   Socioeconomic History  . Marital status: Divorced    Spouse name: Not on file  . Number of children: Not on file  . Years of education: Not on file  . Highest education level: Not on file  Occupational History  . Not on file  Tobacco Use  . Smoking status: Former Smoker    Quit date: 02/26/1992    Years since quitting: 27.2  . Smokeless tobacco: Never Used  Substance and Sexual Activity  . Alcohol  use: Yes  . Drug use: No  . 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 Not on file  Social History Narrative  . Not on file   Social Determinants of Health   Financial Resource Strain:   . Difficulty of Paying Living Expenses:   Food Insecurity:   . Worried About Charity fundraiser in the Last Year:   . Arboriculturist in the Last Year:   Transportation Needs:   . Film/video editor (Medical):   Marland Kitchen Lack of Transportation (Non-Medical):   Physical Activity:   . Days of Exercise per Week:   . Minutes of Exercise per Session:   Stress:   . Feeling of Stress :   Social Connections:   . Frequency of Communication with Friends and Family:   . Frequency of Social Gatherings with Friends and Family:   . Attends Religious Services:   . Active Member of Clubs or Organizations:   . Attends Archivist Meetings:   Marland Kitchen Marital Status:   Intimate Partner Violence:   . Fear of Current or Ex-Partner:   . Emotionally Abused:   Marland Kitchen Physically Abused:   . Sexually Abused:     Past Surgical History:  Procedure Laterality Date  . APPENDECTOMY    .  CATARACT EXTRACTION, BILATERAL Bilateral   . EYE SURGERY Bilateral 2018   cataracts, Dr Gillian Scarce  . SHOULDER SURGERY Right 04/10/2017   rotator cuff repair    Family History  Adopted: Yes  Problem Relation Age of Onset  . COPD Daughter   . Birth defects Daughter        recurrent bronchitis  . ADD / ADHD Son     Allergies  Allergen Reactions  . Penicillins Hives  . Tetracyclines & Related Nausea And Vomiting  . Erythromycin Nausea Only    Makes patient really sick    Current Outpatient Medications on File Prior to Visit  Medication Sig Dispense Refill  . azelastine (ASTELIN) 0.1 % nasal spray SPRAY TWICE IN EACH NOSTRIL TWICE DAILY 30 mL 4  . Cyanocobalamin 500 MCG SUBL Place 1 tablet (500 mcg total) under  the tongue daily. 30 tablet 1  . cyclobenzaprine (FLEXERIL) 10 MG tablet Take 10 mg by mouth 2 (two) times daily.    Marland Kitchen ezetimibe (ZETIA) 10 MG tablet TAKE 1 TABLET DAILY 90 tablet 4  . levothyroxine (SYNTHROID, LEVOTHROID) 175 MCG tablet TAKE 1 TABLET DAILY BEFORE BREAKFAST (NEED OFFICE VISIT FOR GREATER QUANTITIES/REFILLS) 90 tablet 4  . loratadine (CLARITIN) 10 MG tablet Take 10 mg by mouth daily.    . NON FORMULARY Doterra supplements    . Probiotic Product (PROBIOTIC DAILY PO) Take 1 tablet by mouth daily.    . promethazine-dextromethorphan (PROMETHAZINE-DM) 6.25-15 MG/5ML syrup Take 5 mLs by mouth 4 (four) times daily as needed for cough. (Patient not taking: Reported on 05/07/2019) 118 mL 0  . sertraline (ZOLOFT) 100 MG tablet TAKE 1 TABLET DAILY 90 tablet 4  . tiZANidine (ZANAFLEX) 4 MG tablet Take 1 tablet (4 mg total) by mouth at bedtime. 5 tablet 0  . Vitamin D, Ergocalciferol, (DRISDOL) 50000 units CAPS capsule Take 1 capsule (50,000 Units total) by mouth every 7 (seven) days. 4 capsule 4   Current Facility-Administered Medications on File Prior to Visit  Medication Dose Route Frequency Provider Last Rate Last Admin  . cyanocobalamin ((VITAMIN B-12)) injection 1,000 mcg  1,000 mcg Intramuscular Q30 days Penni Homans A, MD   1,000 mcg at 04/28/19 0947    BP 133/89 (BP Location: Right Arm, Patient Position: Sitting, Cuff Size: Large)   Pulse 83   Temp (!) 96.2 F (35.7 C) (Temporal)   Resp 18   Ht 5\' 9"  (1.753 m)   Wt 220 lb (99.8 kg)   SpO2 93%   BMI 32.49 kg/m       Objective:   Physical Exam  General Mental Status- Alert. General Appearance- Not in acute distress.   Skin General: Color- Normal Color. Moisture- Normal Moisture.  Neck Carotid Arteries- Normal color. Moisture- Normal Moisture. No carotid bruits. No JVD.  Chest and Lung Exam Auscultation: Breath Sounds:-Normal.  Cardiovascular Auscultation:Rythm- Regular. Murmurs & Other Heart  Sounds:Auscultation of the heart reveals- No Murmurs.  Abdomen Inspection:-Inspeection Normal. Palpation/Percussion:Note:No mass. Palpation and Percussion of the abdomen reveal- Non Tender, Non Distended + BS, no rebound or guarding.   Neurologic Cranial Nerve exam:- CN III-XII intact(No nystagmus), symmetric smile. Strength:- 5/5 equal and symmetric strength both upper and lower extremities.      Assessment & Plan:  For hx high choleserol will get lipid panel  with cmp today and continue with zetia.  For increased sugar in past will get a1c today.  For low b12 get b12 level.  For hx low vit D get level today.  25 minutes spent with pt today.  Mackie Pai, PA-C

## 2019-05-11 LAB — VITAMIN D 1,25 DIHYDROXY
Vitamin D 1, 25 (OH)2 Total: 64 pg/mL (ref 18–72)
Vitamin D2 1, 25 (OH)2: 9 pg/mL
Vitamin D3 1, 25 (OH)2: 55 pg/mL

## 2019-06-02 ENCOUNTER — Ambulatory Visit (INDEPENDENT_AMBULATORY_CARE_PROVIDER_SITE_OTHER): Payer: Medicare Other | Admitting: Medical

## 2019-06-02 ENCOUNTER — Encounter: Payer: Self-pay | Admitting: Medical

## 2019-06-02 ENCOUNTER — Ambulatory Visit: Payer: Medicare Other

## 2019-06-02 ENCOUNTER — Other Ambulatory Visit: Payer: Self-pay

## 2019-06-02 VITALS — BP 150/82 | HR 97 | Temp 96.2°F | Resp 18 | Ht 69.0 in

## 2019-06-02 DIAGNOSIS — H6121 Impacted cerumen, right ear: Secondary | ICD-10-CM

## 2019-06-02 DIAGNOSIS — O280 Abnormal hematological finding on antenatal screening of mother: Secondary | ICD-10-CM

## 2019-06-02 DIAGNOSIS — J301 Allergic rhinitis due to pollen: Secondary | ICD-10-CM | POA: Diagnosis not present

## 2019-06-02 DIAGNOSIS — D519 Vitamin B12 deficiency anemia, unspecified: Secondary | ICD-10-CM

## 2019-06-02 DIAGNOSIS — H9201 Otalgia, right ear: Secondary | ICD-10-CM

## 2019-06-02 DIAGNOSIS — O26899 Other specified pregnancy related conditions, unspecified trimester: Secondary | ICD-10-CM | POA: Diagnosis not present

## 2019-06-02 DIAGNOSIS — J3489 Other specified disorders of nose and nasal sinuses: Secondary | ICD-10-CM | POA: Diagnosis not present

## 2019-06-02 MED ORDER — AZELASTINE HCL 0.1 % NA SOLN: NASAL | 4 refills | Status: DC

## 2019-06-02 MED ORDER — AZITHROMYCIN 250 MG PO TABS: ORAL_TABLET | ORAL | 0 refills | Status: DC

## 2019-06-02 MED ORDER — FLUTICASONE PROPIONATE 50 MCG/ACT NA SUSP: 2.0000 | Freq: Every day | NASAL | 1 refills | Status: DC

## 2019-06-02 MED ORDER — CYANOCOBALAMIN 1000 MCG/ML IJ SOLN
1000.0000 ug | Freq: Once | INTRAMUSCULAR | Status: AC
  Administered 2019-06-02: 1000 ug via INTRAMUSCULAR

## 2019-06-02 NOTE — Patient Instructions (Addendum)
You have recent right ear pain with mild cerumen impaction, history of allergic rhinitis and mild sinus pressure/possible infection.  Medical assistant lavage right ear canal today(can get debrox otc and use to soften minimal wax on wall).  I went ahead and prescribed a azithromycin antibiotic to cover your sinuses and do have concern for mild folliculitis of scalp based on tenderness to palpation of right parietal/scalp region.  For allergic rhinitis continue antihistamines.  I am refilling your Astelin nasal spray and want you to start Flonase.  For history of low B12 we gave the 12 injection today.  Follow-up 10 to 14 days for any persistent/worsening signs symptoms or as needed.

## 2019-06-02 NOTE — Progress Notes (Addendum)
   Subjective:    Patient ID: Olivia Hodge, female    DOB: 1951-01-18, 69 y.o.   MRN: [id number]  HPI  Pt in with some rt ear discomfort. Ear pain for 4 days. She has some mild parietal region pain with ear pain came along with ear pain. States faint scalp pain. Pt has some nasal on congestion. Hx of allergic rhinitis this time of year. Pt is on zyrtec in am. Claritin in pm. Pt states her astelin just ran out.  She get spring allergies. Last week at beach eyes were itching a little. She is spending a lot of time outside.  Pt has used zpack in past and no reactions.     Review of Systems  Constitutional: Negative for chills, fatigue and fever.  HENT: Positive for congestion, postnasal drip and sneezing. Negative for sore throat.   Eyes: Positive for itching.       Last week  Respiratory: Negative for cough and chest tightness.   Cardiovascular: Negative for chest pain and palpitations.  Musculoskeletal: Negative for back pain.  Neurological: Negative for dizziness and headaches.  Hematological: Positive for adenopathy.  Psychiatric/Behavioral: Negative for behavioral problems and confusion.       Objective:   Physical Exam  General  Mental Status - Alert. General Appearance - Well groomed. Not in acute distress.  Skin Rashes- No Rashes.  HEENT Head- Normal. Ear Auditory Canal - Left- Normal. Right - mild amount of wax present.Tympanic Membrane- Left- Normal. Right- Normal. Eye Sclera/Conjunctiva- Left- Normal. Right- Normal. Nose & Sinuses Nasal Mucosa- Left-  Boggy and Congested. Right-  Boggy and  Congested.Bilateral mild maxillary and frontal sinus pressure. Small minimally tender lymph node just beneath the right ear. Mouth & Throat Lips: Upper Lip- Normal: no dryness, cracking, pallor, cyanosis, or vesicular eruption. Lower Lip-Normal: no dryness, cracking, pallor, cyanosis or vesicular eruption. Buccal Mucosa- Bilateral- No Aphthous ulcers. Oropharynx- No  Discharge or Erythema. .  Neck Neck- Supple. No Masses.   Chest and Lung Exam Auscultation: Breath Sounds:-Clear even and unlabored.  Cardiovascular Auscultation:Rythm- Regular, rate and rhythm. Murmurs & Other Heart Sounds:Ausculatation of the heart reveal- No Murmurs.  Lymphatic Head & Neck General Head & Neck Lymphatics: Bilateral: Description- No Localized lymphadenopathy.       Assessment & Plan:  You have recent right ear pain with mild cerumen impaction, history of allergic rhinitis and mild sinus pressure/possible infection.  Medical assistant lavage right ear canal today(can get debrox otc and use to soften minimal wax on wall.  I went ahead and prescribed a azithromycin antibiotic to cover your sinuses and do have concern for mild folliculitis of scalp based on tenderness to palpation of right parietal/scalp region.  For allergic rhinitis continue antihistamines.  I am refilling your Astelin nasal spray and want you to start Flonase.  For history of low B12 we gave the 12 injection today.  Follow-up 10 to 14 days for any persistent/worsening signs symptoms or as needed  Mackie Pai, PA-C   Time spent with patient today was  30  minutes  discussing diagnosis, work up, treatment and documentation.

## 2019-06-02 NOTE — Addendum Note (Signed)
Addended by: Jeronimo Greaves on: 06/02/2019 03:10 PM   Modules accepted: Orders

## 2019-06-17 ENCOUNTER — Other Ambulatory Visit: Payer: Self-pay | Admitting: Family Medicine

## 2019-06-17 DIAGNOSIS — E538 Deficiency of other specified B group vitamins: Secondary | ICD-10-CM

## 2019-06-17 DIAGNOSIS — E559 Vitamin D deficiency, unspecified: Secondary | ICD-10-CM

## 2019-06-17 DIAGNOSIS — E039 Hypothyroidism, unspecified: Secondary | ICD-10-CM

## 2019-06-17 DIAGNOSIS — E785 Hyperlipidemia, unspecified: Secondary | ICD-10-CM

## 2019-06-17 DIAGNOSIS — E210 Primary hyperparathyroidism: Secondary | ICD-10-CM

## 2019-06-23 ENCOUNTER — Other Ambulatory Visit: Payer: Self-pay | Admitting: Family Medicine

## 2019-06-23 DIAGNOSIS — E559 Vitamin D deficiency, unspecified: Secondary | ICD-10-CM

## 2019-06-23 DIAGNOSIS — E538 Deficiency of other specified B group vitamins: Secondary | ICD-10-CM

## 2019-06-23 DIAGNOSIS — E210 Primary hyperparathyroidism: Secondary | ICD-10-CM

## 2019-06-29 ENCOUNTER — Other Ambulatory Visit: Payer: Self-pay | Admitting: Family Medicine

## 2019-07-19 ENCOUNTER — Encounter: Payer: Self-pay | Admitting: Medical

## 2019-07-21 ENCOUNTER — Ambulatory Visit (INDEPENDENT_AMBULATORY_CARE_PROVIDER_SITE_OTHER): Payer: Medicare Other | Admitting: *Deleted

## 2019-07-21 ENCOUNTER — Other Ambulatory Visit: Payer: Self-pay

## 2019-07-21 DIAGNOSIS — E538 Deficiency of other specified B group vitamins: Secondary | ICD-10-CM | POA: Diagnosis not present

## 2019-07-21 MED ORDER — CYANOCOBALAMIN 1000 MCG/ML IJ SOLN
1000.0000 ug | Freq: Once | INTRAMUSCULAR | Status: AC
  Administered 2019-07-21: 1000 ug via INTRAMUSCULAR

## 2019-07-21 NOTE — Progress Notes (Addendum)
Pt here for monthly B12 injection per PCP order.   B12 1036mcg given in left deltoid , and pt tolerated injection well.   Agree with administration of b12 for Vitamin b12 deficiency. Above note sent to me by Cleda Daub CMA.  Mackie Pai, PA-C

## 2019-08-04 NOTE — Progress Notes (Signed)
I connected with Maro today by telephone and verified that I am speaking with the correct person using two identifiers. Location patient: home Location provider: work Persons participating in the virtual visit: patient, Marine scientist.    I discussed the limitations, risks, security and privacy concerns of performing an evaluation and management service by telephone and the availability of in person appointments. I also discussed with the patient that there may be a patient responsible charge related to this service. The patient expressed understanding and verbally consented to this telephonic visit.    Interactive audio and video telecommunications were attempted between this RN and patient, however failed, due to patient having technical difficulties OR patient did not have access to video capability.  We continued and completed visit with audio only.  Some vital signs may be absent or patient reported.   Subjective:   Olivia Hodge is a 69 y.o. female who presents for Medicare Annual (Subsequent) preventive examination.  Enjoys reading and playing cards w/ friends.   Review of Systems:    Home Safety/Smoke Alarms: Feels safe in home. Smoke alarms in place.  Lives w/ husband in 2 story home. Does well w/ stairs. Handrails in place.  Female:     Mammo- 11/25/18       Dexa scan-07/31/18. Low Bone Mass. (see Care Everywhere)   CCS-12/21/09     Objective:     Vitals: Unable to assess. This visit is enabled though telemedicine due to Covid 19.   Advanced Directives 08/05/2019 02/16/2014  Does Patient Have a Medical Advance Directive? No No  Would patient like information on creating a medical advance directive? No - Patient declined No - patient declined information    Tobacco Social History   Tobacco Use  Smoking Status Former Smoker   Quit date: 02/26/1992   Years since quitting: 27.4  Smokeless Tobacco Never Used     Counseling given: Not Answered   Clinical Intake: Pain :  No/denies pain     Past Medical History:  Diagnosis Date   Allergic state 08/12/2016   Allergy    B12 deficiency    History of chicken pox    Hyperlipidemia    Melanoma of skin (HCC)    Thyroid disease    Vitamin D deficiency 05/11/2015   Past Surgical History:  Procedure Laterality Date   APPENDECTOMY     CATARACT EXTRACTION, BILATERAL Bilateral    EYE SURGERY Bilateral 2018   cataracts, Dr Gillian Scarce   SHOULDER SURGERY Right 04/10/2017   rotator cuff repair   Family History  Adopted: Yes  Problem Relation Age of Onset   COPD Daughter    Birth defects Daughter        recurrent bronchitis   ADD / ADHD Son    Social History   Socioeconomic History   Marital status: Divorced    Spouse name: Not on file   Number of children: Not on file   Years of education: Not on file   Highest education level: Not on file  Occupational History   Not on file  Tobacco Use   Smoking status: Former Smoker    Quit date: 02/26/1992    Years since quitting: 27.4   Smokeless tobacco: Never Used  Substance and Sexual Activity   Alcohol use: Yes   Drug use: No   Sexual activity: Not on file    Comment: lives with husband, 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, No major dietary restrictions  Other Topics Concern   Not on file  Social History Narrative   Not on file   Social Determinants of Health   Financial Resource Strain: Low Risk    Difficulty of Paying Living Expenses: Not hard at all  Food Insecurity: No Food Insecurity   Worried About Charity fundraiser in the Last Year: Never true   Arboriculturist in the Last Year: Never true  Transportation Needs: No Transportation Needs   Lack of Transportation (Medical): No   Lack of Transportation (Non-Medical): No  Physical Activity:    Days of Exercise per Week:    Minutes of Exercise per Session:   Stress:    Feeling of Stress :   Social Connections:    Frequency of Communication with Friends  and Family:    Frequency of Social Gatherings with Friends and Family:    Attends Religious Services:    Active Member of Clubs or Organizations:    Attends Archivist Meetings:    Marital Status:     Outpatient Encounter Medications as of 08/05/2019  Medication Sig   azelastine (ASTELIN) 0.1 % nasal spray SPRAY TWICE IN EACH NOSTRIL TWICE DAILY   ezetimibe (ZETIA) 10 MG tablet TAKE 1 TABLET DAILY   fluticasone (FLONASE) 50 MCG/ACT nasal spray Place 2 sprays into both nostrils daily.   levothyroxine (SYNTHROID) 175 MCG tablet TAKE 1 TABLET DAILY BEFORE BREAKFAST (NEED OFFICE VISIT FOR GREATER QUANTITIES/REFILLS)   loratadine (CLARITIN) 10 MG tablet Take 10 mg by mouth daily.   NON FORMULARY Doterra supplements   Probiotic Product (PROBIOTIC DAILY PO) Take 1 tablet by mouth daily.   sertraline (ZOLOFT) 100 MG tablet TAKE 1 TABLET DAILY   Vitamin D, Ergocalciferol, (DRISDOL) 50000 units CAPS capsule Take 1 capsule (50,000 Units total) by mouth every 7 (seven) days.   cetirizine (ZYRTEC) 10 MG tablet Take by mouth.   [DISCONTINUED] azithromycin (ZITHROMAX) 250 MG tablet Take 2 tablets by mouth on day 1, followed by 1 tablet by mouth daily for 4 days.   [DISCONTINUED] Cyanocobalamin 500 MCG SUBL Place 1 tablet (500 mcg total) under the tongue daily.   [DISCONTINUED] cyclobenzaprine (FLEXERIL) 10 MG tablet Take 10 mg by mouth 2 (two) times daily.   [DISCONTINUED] promethazine-dextromethorphan (PROMETHAZINE-DM) 6.25-15 MG/5ML syrup Take 5 mLs by mouth 4 (four) times daily as needed for cough. (Patient not taking: Reported on 05/07/2019)   [DISCONTINUED] tiZANidine (ZANAFLEX) 4 MG tablet Take 1 tablet (4 mg total) by mouth at bedtime. (Patient not taking: Reported on 08/05/2019)   No facility-administered encounter medications on file as of 08/05/2019.    Activities of Daily Living In your present state of health, do you have any difficulty performing the following  activities: 08/05/2019  Hearing? N  Vision? N  Difficulty concentrating or making decisions? N  Walking or climbing stairs? N  Dressing or bathing? N  Doing errands, shopping? N  Preparing Food and eating ? N  Using the Toilet? N  In the past six months, have you accidently leaked urine? N  Do you have problems with loss of bowel control? N  Managing your Medications? N  Managing your Finances? N  Housekeeping or managing your Housekeeping? N  Some recent data might be hidden    Patient Care Team: Saguier, Iris Pert as PCP - General (Internal Medicine) Renato Shin, MD as Consulting Physician (Endocrinology) Gastroenterology, [facility] as Consulting Physician    Assessment:   This is a routine wellness examination for Olivia Hodge. Physical assessment deferred to PCP.  Exercise Activities and  Dietary recommendations Current Exercise Habits: Home exercise routine;Structured exercise class, Type of exercise: treadmill (and recumbent bike), Time (Minutes): 60, Frequency (Times/Week): 3, Weekly Exercise (Minutes/Week): 180, Exercise limited by: None identified   Diet (meal preparation, eat out, water intake, caffeinated beverages, dairy products, fruits and vegetables): 24 hr recall Breakfast: protein shake Lunch: thai chicken and rice Dinner:  Chicken and salad  Goals     Continue to exercise and eat a well balanced diet.       Fall Risk Fall Risk  08/05/2019 02/12/2018 02/10/2017 01/29/2016 05/11/2015  Falls in the past year? 0 0 No No Yes  Number falls in past yr: 0 - - - 1  Injury with Fall? 0 - - - No  Follow up Falls prevention discussed;Education provided - - - -    Depression Screen PHQ 2/9 Scores 08/05/2019 02/10/2017 01/29/2016 05/11/2015  PHQ - 2 Score 0 0 0 0     Cognitive Function Ad8 score reviewed for issues:  Issues making decisions:no  Less interest in hobbies / activities:no  Repeats questions, stories (family complaining):no  Trouble using ordinary gadgets  (microwave, computer, phone):no  Forgets the month or year: no  Mismanaging finances: no  Remembering appts:no Daily problems with thinking and/or memory:no Ad8 score is=0         Immunization History  Administered Date(s) Administered   Fluad Quad(high Dose 65+) 10/28/2018   Influenza, High Dose Seasonal PF 12/13/2015, 12/04/2016, 11/26/2017   Influenza,inj,Quad PF,6+ Mos 10/24/2013   Influenza-Unspecified 09/26/2014   PFIZER SARS-COV-2 Vaccination 04/01/2019, 04/26/2019   Pneumococcal Conjugate-13 02/03/2018   Tdap 05/25/2014   Zoster Recombinat (Shingrix) 11/20/2017   Screening Tests Health Maintenance  Topic Date Due   INFLUENZA VACCINE  09/26/2019   COLONOSCOPY  12/22/2019   MAMMOGRAM  11/24/2020   TETANUS/TDAP  05/24/2024   DEXA SCAN  Completed   COVID-19 Vaccine  Completed   Hepatitis C Screening  Completed   PNA vac Low Risk Adult  Completed      Plan:   See you next year!  Continue to eat heart healthy diet (full of fruits, vegetables, whole grains, lean protein, water--limit salt, fat, and sugar intake) and increase physical activity as tolerated.  Continue doing brain stimulating activities (puzzles, reading, adult coloring books, staying active) to keep memory sharp.    I have personally reviewed and noted the following in the patients chart:    Medical and social history  Use of alcohol, tobacco or illicit drugs   Current medications and supplements  Functional ability and status  Nutritional status  Physical activity  Advanced directives  List of other physicians  Hospitalizations, surgeries, and ER visits in previous 12 months  Vitals  Screenings to include cognitive, depression, and falls  Referrals and appointments  In addition, I have reviewed and discussed with patient certain preventive protocols, quality metrics, and best practice recommendations. A written personalized care plan for preventive services as  well as general preventive health recommendations were provided to patient.    Due to this being a telephonic visit, the after visit summary with patients personalized plan was offered to patient via mail or my-chart.Patient would like to access on my-chart.  Shela Nevin, [place]  08/05/2019

## 2019-08-05 ENCOUNTER — Other Ambulatory Visit: Payer: Self-pay

## 2019-08-05 ENCOUNTER — Ambulatory Visit (INDEPENDENT_AMBULATORY_CARE_PROVIDER_SITE_OTHER): Payer: Medicare Other | Admitting: *Deleted

## 2019-08-05 ENCOUNTER — Encounter: Payer: Self-pay | Admitting: *Deleted

## 2019-08-05 DIAGNOSIS — Z0000 Encounter for general adult medical examination without abnormal findings: Secondary | ICD-10-CM | POA: Diagnosis not present

## 2019-08-05 NOTE — Patient Instructions (Signed)
See you next year!  Continue to eat heart healthy diet (full of fruits, vegetables, whole grains, lean protein, water--limit salt, fat, and sugar intake) and increase physical activity as tolerated.  Continue doing brain stimulating activities (puzzles, reading, adult coloring books, staying active) to keep memory sharp.    Olivia Hodge , Thank you for taking time to come for your Medicare Wellness Visit. I appreciate your ongoing commitment to your health goals. Please review the following plan we discussed and let me know if I can assist you in the future.   These are the goals we discussed: Goals    . Continue to exercise and eat a well balanced diet.       This is a list of the screening recommended for you and due dates:  Health Maintenance  Topic Date Due  . Flu Shot  09/26/2019  . Colon Cancer Screening  12/22/2019  . Mammogram  11/24/2020  . Tetanus Vaccine  05/24/2024  . DEXA scan (bone density measurement)  Completed  . COVID-19 Vaccine  Completed  .  Hepatitis C: One time screening is recommended by Center for Disease Control  (CDC) for  adults born from [birth year] through 1966.   Completed  . Pneumonia vaccines  Completed    Preventive Care 72 Years and Older, Female Preventive care refers to lifestyle choices and visits with your health care provider that can promote health and wellness. This includes:  A yearly physical exam. This is also called an annual well check.  Regular dental and eye exams.  Immunizations.  Screening for certain conditions.  Healthy lifestyle choices, such as diet and exercise. What can I expect for my preventive care visit? Physical exam Your health care provider will check:  Height and weight. These may be used to calculate body mass index (BMI), which is a measurement that tells if you are at a healthy weight.  Heart rate and blood pressure.  Your skin for abnormal spots. Counseling Your health care provider may ask you questions  about:  Alcohol, tobacco, and drug use.  Emotional well-being.  Home and relationship well-being.  Sexual activity.  Eating habits.  History of falls.  Memory and ability to understand (cognition).  Work and work Statistician.  Pregnancy and menstrual history. What immunizations do I need?  Influenza (flu) vaccine  This is recommended every year. Tetanus, diphtheria, and pertussis (Tdap) vaccine  You may need a Td booster every 10 years. Varicella (chickenpox) vaccine  You may need this vaccine if you have not already been vaccinated. Zoster (shingles) vaccine  You may need this after age 63. Pneumococcal conjugate (PCV13) vaccine  One dose is recommended after age 8. Pneumococcal polysaccharide (PPSV23) vaccine  One dose is recommended after age 68. Measles, mumps, and rubella (MMR) vaccine  You may need at least one dose of MMR if you were born in 1957 or later. You may also need a second dose. Meningococcal conjugate (MenACWY) vaccine  You may need this if you have certain conditions. Hepatitis A vaccine  You may need this if you have certain conditions or if you travel or work in places where you may be exposed to hepatitis A. Hepatitis B vaccine  You may need this if you have certain conditions or if you travel or work in places where you may be exposed to hepatitis B. Haemophilus influenzae type b (Hib) vaccine  You may need this if you have certain conditions. You may receive vaccines as individual doses or as more  than one vaccine together in one shot (combination vaccines). Talk with your health care provider about the risks and benefits of combination vaccines. What tests do I need? Blood tests  Lipid and cholesterol levels. These may be checked every 5 years, or more frequently depending on your overall health.  Hepatitis C test.  Hepatitis B test. Screening  Lung cancer screening. You may have this screening every year starting at age 69 if  you have a 30-pack-year history of smoking and currently smoke or have quit within the past 15 years.  Colorectal cancer screening. All adults should have this screening starting at age 27 and continuing until age 13. Your health care provider may recommend screening at age 43 if you are at increased risk. You will have tests every 1-10 years, depending on your results and the type of screening test.  Diabetes screening. This is done by checking your blood sugar (glucose) after you have not eaten for a while (fasting). You may have this done every 1-3 years.  Mammogram. This may be done every 1-2 years. Talk with your health care provider about how often you should have regular mammograms.  BRCA-related cancer screening. This may be done if you have a family history of breast, ovarian, tubal, or peritoneal cancers. Other tests  Sexually transmitted disease (STD) testing.  Bone density scan. This is done to screen for osteoporosis. You may have this done starting at age 7. Follow these instructions at home: Eating and drinking  Eat a diet that includes fresh fruits and vegetables, whole grains, lean protein, and low-fat dairy products. Limit your intake of foods with high amounts of sugar, saturated fats, and salt.  Take vitamin and mineral supplements as recommended by your health care provider.  Do not drink alcohol if your health care provider tells you not to drink.  If you drink alcohol: ? Limit how much you have to 0-1 drink a day. ? Be aware of how much alcohol is in your drink. In the [place] one drink equals one 12 oz bottle of beer (355 mL), one 5 oz glass of wine (148 mL), or one 1 oz glass of hard liquor (44 mL). Lifestyle  Take daily care of your teeth and gums.  Stay active. Exercise for at least 30 minutes on 5 or more days each week.  Do not use any products that contain nicotine or tobacco, such as cigarettes, e-cigarettes, and chewing tobacco. If you need help  quitting, ask your health care provider.  If you are sexually active, practice safe sex. Use a condom or other form of protection in order to prevent STIs (sexually transmitted infections).  Talk with your health care provider about taking a low-dose aspirin or statin. What's next?  Go to your health care provider once a year for a well check visit.  Ask your health care provider how often you should have your eyes and teeth checked.  Stay up to date on all vaccines. This information is not intended to replace advice given to you by your health care provider. Make sure you discuss any questions you have with your health care provider. Document Revised: 02/05/2018 Document Reviewed: 02/05/2018 Elsevier Patient Education  2020 [employer].

## 2019-08-25 ENCOUNTER — Ambulatory Visit: Payer: Medicare Other

## 2019-09-01 ENCOUNTER — Other Ambulatory Visit: Payer: Self-pay

## 2019-09-01 ENCOUNTER — Ambulatory Visit (INDEPENDENT_AMBULATORY_CARE_PROVIDER_SITE_OTHER): Payer: Medicare Other | Admitting: *Deleted

## 2019-09-01 DIAGNOSIS — E538 Deficiency of other specified B group vitamins: Secondary | ICD-10-CM | POA: Diagnosis not present

## 2019-09-01 MED ORDER — CYANOCOBALAMIN 1000 MCG/ML IJ SOLN
1000.0000 ug | Freq: Once | INTRAMUSCULAR | Status: AC
  Administered 2019-09-01: 1000 ug via INTRAMUSCULAR

## 2019-09-01 NOTE — Progress Notes (Signed)
Patient here for monthly b12 injection.  Injection given in left deltoid and patient tolerated well.

## 2019-10-06 ENCOUNTER — Ambulatory Visit (INDEPENDENT_AMBULATORY_CARE_PROVIDER_SITE_OTHER): Payer: Medicare Other

## 2019-10-06 ENCOUNTER — Other Ambulatory Visit: Payer: Self-pay

## 2019-10-06 DIAGNOSIS — E538 Deficiency of other specified B group vitamins: Secondary | ICD-10-CM

## 2019-10-06 MED ORDER — CYANOCOBALAMIN 1000 MCG/ML IJ SOLN
1000.0000 ug | Freq: Once | INTRAMUSCULAR | Status: AC
  Administered 2019-10-06: 1000 ug via INTRAMUSCULAR

## 2019-10-06 NOTE — Progress Notes (Addendum)
Pt here for monthly B12 injection per Elise Benne  B12 1061mcg given IM, left deltoid and pt tolerated injection well.  Next B12 injection scheduled for next month.  Agree with administration.  Mackie Pai, PA-C

## 2019-11-03 ENCOUNTER — Ambulatory Visit: Payer: Medicare Other

## 2019-11-05 ENCOUNTER — Ambulatory Visit (INDEPENDENT_AMBULATORY_CARE_PROVIDER_SITE_OTHER): Payer: Medicare Other

## 2019-11-05 ENCOUNTER — Other Ambulatory Visit: Payer: Self-pay

## 2019-11-05 DIAGNOSIS — E538 Deficiency of other specified B group vitamins: Secondary | ICD-10-CM

## 2019-11-05 MED ORDER — CYANOCOBALAMIN 1000 MCG/ML IJ SOLN
1000.0000 ug | Freq: Once | INTRAMUSCULAR | Status: AC
  Administered 2019-11-05: 1000 ug via INTRAMUSCULAR

## 2019-11-05 NOTE — Progress Notes (Addendum)
Pt here for monthly B12 injection per Mackie Pai.   B12 1098mcg given IM in left deltoid, and pt tolerated injection well.  Next B12 injection scheduled for next month.   Agree with administration.  Mackie Pai, PA-C

## 2019-12-06 ENCOUNTER — Other Ambulatory Visit (HOSPITAL_BASED_OUTPATIENT_CLINIC_OR_DEPARTMENT_OTHER): Payer: Self-pay | Admitting: Medical

## 2019-12-06 DIAGNOSIS — Z1231 Encounter for screening mammogram for malignant neoplasm of breast: Secondary | ICD-10-CM

## 2019-12-09 ENCOUNTER — Ambulatory Visit (INDEPENDENT_AMBULATORY_CARE_PROVIDER_SITE_OTHER): Payer: Medicare Other | Admitting: *Deleted

## 2019-12-09 ENCOUNTER — Other Ambulatory Visit: Payer: Self-pay

## 2019-12-09 DIAGNOSIS — E538 Deficiency of other specified B group vitamins: Secondary | ICD-10-CM | POA: Diagnosis not present

## 2019-12-09 MED ORDER — CYANOCOBALAMIN 1000 MCG/ML IJ SOLN
1000.0000 ug | Freq: Once | INTRAMUSCULAR | Status: AC
  Administered 2019-12-09: 1000 ug via INTRAMUSCULAR

## 2019-12-09 NOTE — Progress Notes (Addendum)
6Pt here for monthly B12 injection per Mackie Pai.    B12 1025mcg given IM in left deltoid, and pt tolerated injection well.   Next B12 injection scheduled for next month  Agree with administration of b12.  Mackie Pai, PA-C

## 2019-12-16 ENCOUNTER — Ambulatory Visit: Payer: Medicare Other

## 2019-12-16 ENCOUNTER — Other Ambulatory Visit: Payer: Self-pay

## 2020-01-07 ENCOUNTER — Telehealth: Payer: Self-pay | Admitting: Medical

## 2020-01-07 ENCOUNTER — Other Ambulatory Visit: Payer: Self-pay

## 2020-01-07 ENCOUNTER — Ambulatory Visit (INDEPENDENT_AMBULATORY_CARE_PROVIDER_SITE_OTHER): Payer: Medicare Other

## 2020-01-07 DIAGNOSIS — E538 Deficiency of other specified B group vitamins: Secondary | ICD-10-CM | POA: Diagnosis not present

## 2020-01-07 MED ORDER — CYANOCOBALAMIN 1000 MCG/ML IJ SOLN
1000.0000 ug | Freq: Once | INTRAMUSCULAR | Status: AC
  Administered 2020-01-07: 1000 ug via INTRAMUSCULAR

## 2020-01-07 NOTE — Progress Notes (Addendum)
Pt here for monthly B12 injection per Mackie Pai  B12 1034mcg given IM in left deltoid, and pt tolerated injection well.  Next B12 injection scheduled for next month.   Mackie Pai, PA-C

## 2020-01-07 NOTE — Telephone Encounter (Signed)
Pt was in office stating is needing colonoscopy done - needing a referral, pt's last colonoscopy she mentioned was 10 years ago with [facility] (pt does not remember name of facility well just [facility]) Please advise. Pt tel [phone].

## 2020-01-08 ENCOUNTER — Telehealth: Payer: Self-pay | Admitting: Medical

## 2020-01-08 DIAGNOSIS — Z1211 Encounter for screening for malignant neoplasm of colon: Secondary | ICD-10-CM

## 2020-01-08 NOTE — Telephone Encounter (Signed)
Open to place referral.

## 2020-01-08 NOTE — Telephone Encounter (Signed)
review 

## 2020-01-08 NOTE — Telephone Encounter (Signed)
I put in referral to our GI at [facility]. Or she can go to [facility] but they should have called her already if she is due?? Not sure why they have not contacted her. Prefer that study done with [facility] so I will see report immediately and it will be in our system. Otherwise delay in getting report or don't get.

## 2020-01-08 NOTE — Telephone Encounter (Signed)
Referral placed to GI. See note I sent you.

## 2020-01-10 ENCOUNTER — Other Ambulatory Visit: Payer: Self-pay

## 2020-01-10 ENCOUNTER — Ambulatory Visit (HOSPITAL_BASED_OUTPATIENT_CLINIC_OR_DEPARTMENT_OTHER)
Admission: RE | Admit: 2020-01-10 | Discharge: 2020-01-10 | Disposition: A | Discharge status: Home / Self Care | Payer: Medicare Other | Source: Ambulatory Visit | Attending: Medical | Admitting: Medical

## 2020-01-10 ENCOUNTER — Encounter (HOSPITAL_BASED_OUTPATIENT_CLINIC_OR_DEPARTMENT_OTHER): Payer: Self-pay

## 2020-01-10 DIAGNOSIS — Z1231 Encounter for screening mammogram for malignant neoplasm of breast: Secondary | ICD-10-CM | POA: Insufficient documentation

## 2020-02-10 ENCOUNTER — Ambulatory Visit (INDEPENDENT_AMBULATORY_CARE_PROVIDER_SITE_OTHER): Payer: Medicare Other

## 2020-02-10 ENCOUNTER — Other Ambulatory Visit: Payer: Self-pay

## 2020-02-10 DIAGNOSIS — E538 Deficiency of other specified B group vitamins: Secondary | ICD-10-CM | POA: Diagnosis not present

## 2020-02-10 MED ORDER — CYANOCOBALAMIN 1000 MCG/ML IJ SOLN
1000.0000 ug | Freq: Once | INTRAMUSCULAR | Status: AC
  Administered 2020-02-10: 1000 ug via INTRAMUSCULAR

## 2020-02-10 NOTE — Progress Notes (Addendum)
Pt here for monthly B12 injection per Mackie Pai.  B12 1038mcg given IM right deltoid , and pt tolerated injection well.  Next B12 injection scheduled for next month  [employer], [employer]

## 2020-02-21 ENCOUNTER — Other Ambulatory Visit: Payer: Self-pay

## 2020-02-21 ENCOUNTER — Ambulatory Visit (AMBULATORY_SURGERY_CENTER): Payer: Self-pay | Admitting: *Deleted

## 2020-02-21 VITALS — Ht 69.0 in | Wt 223.0 lb

## 2020-02-21 DIAGNOSIS — Z1211 Encounter for screening for malignant neoplasm of colon: Secondary | ICD-10-CM

## 2020-02-21 MED ORDER — SUPREP BOWEL PREP KIT 17.5-3.13-1.6 GM/177ML PO SOLN: 1.0000 | Freq: Once | ORAL | 0 refills | Status: AC

## 2020-02-21 NOTE — Progress Notes (Signed)
No egg or soy allergy known to patient  No issues with past sedation with any surgeries or procedures No intubation problems in the past  No FH of Malignant Hyperthermia No diet pills per patient No home 02 use per patient  No blood thinners per patient  Pt denies issues with constipation  No A fib or A flutter  EMMI video to pt or via [place] 19 guidelines implemented in PV today with Pt and RN  Pt is fully vaccinated  for Covid   Pt is in a colon screening  study research- we will need to send colon report to study MD-   Pt [facility]- pt will call if she does not receive Suprep in the next week for [place] to send to [facility]   Due to the COVID-19 pandemic we are asking patients to follow certain guidelines.  Pt aware of COVID protocols and [facility] guidelines

## 2020-03-02 ENCOUNTER — Encounter: Payer: Self-pay | Admitting: Gastroenterology

## 2020-03-08 ENCOUNTER — Encounter: Payer: Self-pay | Admitting: Gastroenterology

## 2020-03-08 ENCOUNTER — Ambulatory Visit (AMBULATORY_SURGERY_CENTER): Payer: Medicare Other | Admitting: Gastroenterology

## 2020-03-08 ENCOUNTER — Other Ambulatory Visit: Payer: Self-pay

## 2020-03-08 VITALS — BP 136/81 | HR 73 | Temp 97.5°F | Resp 24 | Ht 69.0 in | Wt 223.0 lb

## 2020-03-08 DIAGNOSIS — Z1211 Encounter for screening for malignant neoplasm of colon: Secondary | ICD-10-CM | POA: Diagnosis not present

## 2020-03-08 DIAGNOSIS — D124 Benign neoplasm of descending colon: Secondary | ICD-10-CM | POA: Diagnosis not present

## 2020-03-08 DIAGNOSIS — D122 Benign neoplasm of ascending colon: Secondary | ICD-10-CM | POA: Diagnosis not present

## 2020-03-08 DIAGNOSIS — D128 Benign neoplasm of rectum: Secondary | ICD-10-CM

## 2020-03-08 DIAGNOSIS — K621 Rectal polyp: Secondary | ICD-10-CM | POA: Diagnosis not present

## 2020-03-08 MED ORDER — SODIUM CHLORIDE 0.9 % IV SOLN: 500.0000 mL | Freq: Once | INTRAVENOUS | Status: DC

## 2020-03-08 NOTE — Progress Notes (Signed)
Report to PACU, RN, vss, BBS= Clear.  

## 2020-03-08 NOTE — Progress Notes (Signed)
Called to room to assist during endoscopic procedure.  Patient ID and intended procedure confirmed with present staff. Received instructions for my participation in the procedure from the performing physician.  

## 2020-03-08 NOTE — Patient Instructions (Signed)
YOU HAD AN ENDOSCOPIC PROCEDURE TODAY AT [facility]:   Refer to the procedure report that was given to you for any specific questions about what was found during the examination.  If the procedure report does not answer your questions, please call your gastroenterologist to clarify.  If you requested that your care partner not be given the details of your procedure findings, then the procedure report has been included in a sealed envelope for you to review at your convenience later.  YOU SHOULD EXPECT: Some feelings of bloating in the abdomen. Passage of more gas than usual.  Walking can help get rid of the air that was put into your GI tract during the procedure and reduce the bloating. If you had a lower endoscopy (such as a colonoscopy or flexible sigmoidoscopy) you may notice spotting of blood in your stool or on the toilet paper. If you underwent a bowel prep for your procedure, you may not have a normal bowel movement for a few days.  **Handouts given on Polyps, Diverticulosis and Hemorrhoids**  Please Note:  You might notice some irritation and congestion in your nose or some drainage.  This is from the oxygen used during your procedure.  There is no need for concern and it should clear up in a day or so.  SYMPTOMS TO REPORT IMMEDIATELY:   Following lower endoscopy (colonoscopy or flexible sigmoidoscopy):  Excessive amounts of blood in the stool  Significant tenderness or worsening of abdominal pains  Swelling of the abdomen that is new, acute  Fever of 100F or higher   For urgent or emergent issues, a gastroenterologist can be reached at any hour by calling [phone]. Do not use MyChart messaging for urgent concerns.    DIET:  We do recommend a small meal at first, but then you may proceed to your regular diet.  Drink plenty of fluids but you should avoid alcoholic beverages for 24 hours.  ACTIVITY:  You should plan to take it easy for the rest of today and you  should NOT DRIVE or use heavy machinery until tomorrow (because of the sedation medicines used during the test).    FOLLOW UP: Our staff will call the number listed on your records 48-72 hours following your procedure to check on you and address any questions or concerns that you may have regarding the information given to you following your procedure. If we do not reach you, we will leave a message.  We will attempt to reach you two times.  During this call, we will ask if you have developed any symptoms of COVID 19. If you develop any symptoms (ie: fever, flu-like symptoms, shortness of breath, cough etc.) before then, please call [phone].  If you test positive for Covid 19 in the 2 weeks post procedure, please call and report this information to [place].    If any biopsies were taken you will be contacted by phone or by letter within the next 1-3 weeks.  Please call us at [phone] if you have not heard about the biopsies in 3 weeks.    SIGNATURES/CONFIDENTIALITY: You and/or your care partner have signed paperwork which will be entered into your electronic medical record.  These signatures attest to the fact that that the information above on your After Visit Summary has been reviewed and is understood.  Full responsibility of the confidentiality of this discharge information lies with you and/or your care-partner.

## 2020-03-08 NOTE — Progress Notes (Signed)
Vitals-CW  Pt's states no medical or surgical changes since previsit or office visit. 

## 2020-03-08 NOTE — Op Note (Signed)
[facility] Patient Name: Olivia Hodge Procedure Date: 03/08/2020 8:51 AM MRN: [id number] Endoscopist: Jackquline Denmark , MD Age: 70 Referring MD:  Date of Birth: 1950/11/16 Gender: Female Account #: [id number] Procedure:                Colonoscopy Indications:              Screening for colorectal malignant neoplasm Medicines:                Monitored Anesthesia Care Procedure:                Pre-Anesthesia Assessment:                           - Prior to the procedure, a History and Physical                            was performed, and patient medications and                            allergies were reviewed. The patient's tolerance of                            previous anesthesia was also reviewed. The risks                            and benefits of the procedure and the sedation                            options and risks were discussed with the patient.                            All questions were answered, and informed consent                            was obtained. Prior Anticoagulants: The patient has                            taken no previous anticoagulant or antiplatelet                            agents. ASA Grade Assessment: II - A patient with                            mild systemic disease. After reviewing the risks                            and benefits, the patient was deemed in                            satisfactory condition to undergo the procedure.                           After obtaining informed consent, the colonoscope  was passed under direct vision. Throughout the                            procedure, the patient's blood pressure, pulse, and                            oxygen saturations were monitored continuously. The                            Olympus PFC-[id number] (#[id number]) Colonoscope was                            introduced through the anus and advanced to the 2                            cm into the ileum. The  colonoscopy was performed                            without difficulty. The patient tolerated the                            procedure well. The quality of the bowel                            preparation was good except in the cecum where                            there was adherent and solid stool which could not                            be fully voiced. Aggressive suctioning and                            aspiration was performed. Overall the examination                            was adequate. The terminal ileum, ileocecal valve,                            appendiceal orifice, and rectum were photographed. Scope In: 9:17:29 AM Scope Out: 9:36:26 AM Scope Withdrawal Time: 0 hours 13 minutes 39 seconds  Total Procedure Duration: 0 hours 18 minutes 57 seconds  Findings:                 A 6 mm polyp was found in the proximal ascending                            colon. The polyp was sessile. The polyp was removed                            with a cold snare. Resection and retrieval were  complete.                           A 10 mm polyp was found in the mid descending                            colon. The polyp was sessile. The polyp was removed                            with a hot snare. Resection and retrieval were                            complete.                           Three sessile polyps were found in the rectum. The                            polyps were 2 to 4 mm in size. These polyps were                            removed with a cold snare. Resection and retrieval                            were complete.                           Multiple medium-mouthed diverticula were found in                            the sigmoid colon, few in descending colon and                            ascending colon.                           Non-bleeding internal hemorrhoids were found during                            retroflexion. The hemorrhoids were  moderate.                           The terminal ileum appeared normal.                           The exam was otherwise without abnormality on                            direct and retroflexion views. Complications:            No immediate complications. Estimated Blood Loss:     Estimated blood loss: none. Impression:               - Colonic polyps s/p polypectomy.                           -  Pancolonic diverticulosis predominantly in the                            sigmoid colon.                           - Non-bleeding internal hemorrhoids.                           - The examined portion of the ileum was normal.                           - The examination was otherwise normal on direct                            and retroflexion views. Recommendation:           - Patient has a contact number available for                            emergencies. The signs and symptoms of potential                            delayed complications were discussed with the                            patient. Return to normal activities tomorrow.                            Written discharge instructions were provided to the                            patient.                           - High fiber diet.                           - Continue present medications.                           - Await pathology results.                           - Repeat colonoscopy for surveillance based on                            pathology results.                           - The findings and recommendations were discussed                            with the patient's husband Remo Lipps. Jackquline Denmark, MD 03/08/2020 9:45:08 AM This report has been signed electronically.

## 2020-03-10 ENCOUNTER — Telehealth: Payer: Self-pay

## 2020-03-10 NOTE — Telephone Encounter (Signed)
  Follow up Call-  Call back number 03/08/2020  Post procedure Call Back phone  # [phone]  Permission to leave phone message Yes  Some recent data might be hidden     Patient questions:  Do you have a fever, pain , or abdominal swelling? No. Pain Score  0 *  Have you tolerated food without any problems? Yes.    Have you been able to return to your normal activities? Yes.    Do you have any questions about your discharge instructions: Diet   No. Medications  No. Follow up visit  No.  Do you have questions or concerns about your Care? No.  Actions: * If pain score is 4 or above: No action needed, pain <4. 1. Have you developed a fever since your procedure? no  2.   Have you had an respiratory symptoms (SOB or cough) since your procedure? no  3.   Have you tested positive for COVID 19 since your procedure no  4.   Have you had any family members/close contacts diagnosed with the COVID 19 since your procedure?  No    If yes to any of these questions please route to Joylene John, RN and Joella Prince, RN

## 2020-03-13 ENCOUNTER — Encounter: Payer: Self-pay | Admitting: Gastroenterology

## 2020-03-15 ENCOUNTER — Other Ambulatory Visit: Payer: Self-pay

## 2020-03-15 ENCOUNTER — Ambulatory Visit: Payer: Medicare Other

## 2020-03-15 ENCOUNTER — Ambulatory Visit (INDEPENDENT_AMBULATORY_CARE_PROVIDER_SITE_OTHER): Payer: Medicare Other

## 2020-03-15 DIAGNOSIS — E538 Deficiency of other specified B group vitamins: Secondary | ICD-10-CM

## 2020-03-15 MED ORDER — CYANOCOBALAMIN 1000 MCG/ML IJ SOLN
1000.0000 ug | Freq: Once | INTRAMUSCULAR | Status: AC
  Administered 2020-03-15: 1000 ug via INTRAMUSCULAR

## 2020-03-15 NOTE — Progress Notes (Addendum)
Pt here for monthly B12 injection per Byrd Hesselbach, PA-C  B12 1078mcg given IM in left deltiod, and pt tolerated injection well.  Next B12 injection scheduled for 04/19/20 at 9:30AM    Mackie Pai, PA-C

## 2020-04-19 ENCOUNTER — Ambulatory Visit: Payer: Medicare Other

## 2020-04-21 ENCOUNTER — Ambulatory Visit: Payer: Medicare Other

## 2020-04-26 ENCOUNTER — Other Ambulatory Visit: Payer: Self-pay

## 2020-04-26 ENCOUNTER — Telehealth: Payer: Self-pay

## 2020-04-26 ENCOUNTER — Ambulatory Visit (INDEPENDENT_AMBULATORY_CARE_PROVIDER_SITE_OTHER): Payer: Medicare Other

## 2020-04-26 DIAGNOSIS — E538 Deficiency of other specified B group vitamins: Secondary | ICD-10-CM | POA: Diagnosis not present

## 2020-04-26 MED ORDER — CYANOCOBALAMIN 1000 MCG/ML IJ SOLN
1000.0000 ug | Freq: Once | INTRAMUSCULAR | Status: AC
  Administered 2020-04-26: 1000 ug via INTRAMUSCULAR

## 2020-04-26 NOTE — Progress Notes (Cosign Needed)
Olivia Hodge is a 70 y.o. female presents to the office today for monthly B12 injection, per physician's orders. Original order: 05/25/2014 Annye Asa MD B12 1000 mcg IM was administered Left arm today. Patient tolerated injection. Patient due for follow up labs/provider appt: yes.- appointment made. Patient next injection due:in 1 month, appt made yes  Creft, Darlis Loan, RMA   [employer], PA-C

## 2020-04-26 NOTE — Telephone Encounter (Signed)
I don't see those labs that she is referring to and looked in care everywhere? What specialist did she see? Where was she seen ED?

## 2020-04-26 NOTE — Telephone Encounter (Signed)
Pt wanted you to know that she has had labs done at [facility] labs. She would like to know if you have received copies so that you can review.

## 2020-06-05 ENCOUNTER — Ambulatory Visit: Payer: Medicare Other | Attending: Internal Medicine

## 2020-06-05 ENCOUNTER — Other Ambulatory Visit: Payer: Self-pay

## 2020-06-05 ENCOUNTER — Encounter: Payer: Self-pay | Admitting: Medical

## 2020-06-05 ENCOUNTER — Ambulatory Visit (INDEPENDENT_AMBULATORY_CARE_PROVIDER_SITE_OTHER): Payer: Medicare Other | Admitting: Medical

## 2020-06-05 VITALS — BP 120/70 | HR 80 | Temp 98.2°F | Resp 18 | Ht 69.0 in | Wt 221.6 lb

## 2020-06-05 DIAGNOSIS — E782 Mixed hyperlipidemia: Secondary | ICD-10-CM

## 2020-06-05 DIAGNOSIS — R7309 Other abnormal glucose: Secondary | ICD-10-CM

## 2020-06-05 DIAGNOSIS — E538 Deficiency of other specified B group vitamins: Secondary | ICD-10-CM | POA: Diagnosis not present

## 2020-06-05 DIAGNOSIS — E039 Hypothyroidism, unspecified: Secondary | ICD-10-CM

## 2020-06-05 DIAGNOSIS — J301 Allergic rhinitis due to pollen: Secondary | ICD-10-CM

## 2020-06-05 DIAGNOSIS — Z87891 Personal history of nicotine dependence: Secondary | ICD-10-CM

## 2020-06-05 DIAGNOSIS — E785 Hyperlipidemia, unspecified: Secondary | ICD-10-CM

## 2020-06-05 DIAGNOSIS — R109 Unspecified abdominal pain: Secondary | ICD-10-CM

## 2020-06-05 DIAGNOSIS — E559 Vitamin D deficiency, unspecified: Secondary | ICD-10-CM

## 2020-06-05 DIAGNOSIS — Z23 Encounter for immunization: Secondary | ICD-10-CM

## 2020-06-05 DIAGNOSIS — E210 Primary hyperparathyroidism: Secondary | ICD-10-CM

## 2020-06-05 LAB — COMPREHENSIVE METABOLIC PANEL
ALT: 14 U/L (ref 0–35)
AST: 14 U/L (ref 0–37)
Albumin: 4.1 g/dL (ref 3.5–5.2)
Alkaline Phosphatase: 55 U/L (ref 39–117)
BUN: 14 mg/dL (ref 6–23)
CO2: 31 mEq/L (ref 19–32)
Calcium: 9.9 mg/dL (ref 8.4–10.5)
Chloride: 106 mEq/L (ref 96–112)
Creatinine, Ser: 0.8 mg/dL (ref 0.40–1.20)
GFR: 74.68 mL/min (ref >60.00)
Glucose, Bld: 95 mg/dL (ref 70–99)
Potassium: 4.2 mEq/L (ref 3.5–5.1)
Sodium: 141 mEq/L (ref 135–145)
Total Bilirubin: 0.6 mg/dL (ref 0.2–1.2)
Total Protein: 6.4 g/dL (ref 6.0–8.3)

## 2020-06-05 LAB — LIPID PANEL
Cholesterol: 182 mg/dL (ref 0–200)
HDL: 51.6 mg/dL (ref >39.00)
LDL Cholesterol: 105 mg/dL — ABNORMAL HIGH (ref 0–99)
NonHDL: 130.88
Total CHOL/HDL Ratio: 4
Triglycerides: 131 mg/dL (ref 0.0–149.0)
VLDL: 26.2 mg/dL (ref 0.0–40.0)

## 2020-06-05 LAB — HEMOGLOBIN A1C: Hgb A1c MFr Bld: 5.8 % (ref 4.6–6.5)

## 2020-06-05 MED ORDER — EZETIMIBE 10 MG PO TABS: 10.0000 mg | ORAL_TABLET | Freq: Every day | ORAL | 3 refills | Status: DC

## 2020-06-05 MED ORDER — FLUTICASONE PROPIONATE 50 MCG/ACT NA SUSP: 2.0000 | Freq: Every day | NASAL | 1 refills | Status: AC

## 2020-06-05 MED ORDER — SERTRALINE HCL 100 MG PO TABS: 1.0000 | ORAL_TABLET | Freq: Every day | ORAL | 3 refills | Status: DC

## 2020-06-05 MED ORDER — CYANOCOBALAMIN 1000 MCG/ML IJ SOLN
1000.0000 ug | Freq: Once | INTRAMUSCULAR | Status: AC
  Administered 2020-06-05: 1000 ug via INTRAMUSCULAR

## 2020-06-05 MED ORDER — AZELASTINE HCL 0.1 % NA SOLN: NASAL | 4 refills | Status: AC

## 2020-06-05 MED ORDER — MONTELUKAST SODIUM 10 MG PO TABS: 10.0000 mg | ORAL_TABLET | Freq: Every day | ORAL | 1 refills | Status: DC

## 2020-06-05 MED ORDER — CETIRIZINE HCL 10 MG PO TABS: 10.0000 mg | ORAL_TABLET | Freq: Every day | ORAL | 3 refills | Status: DC

## 2020-06-05 NOTE — Patient Instructions (Signed)
History of hyperlipidemia and on Zetia presently.  Will get lipid panel today.  Mild elevated sugars in the past.  Will get A1c/45-month sugar average test today and recommend low sugar diet.  History of hypothyroidism with end range TSH done by endocrinologist.  Continue current dose of Synthroid.  Seasonal allergies not adequately treated presently with Zyrtec, Astelin and Flonase.  Do recommend adding montelukast 10 mg at night.  Hopefully this will lessen your symptoms.  Recent intermittent abdomen pain left lower quadrant region of sigmoid colon.  On review of last colonoscopy diverticulosis throughout colon but worse in sigmoid colon region.  No current pain presently.  However do recommend that if you do have recurrent pain in the future please let us know.  In that event would prescribe Cipro and Flagyl.  If not getting improvement with treatment then would recommend advancing to CT abdomen and pelvis.  History of low B12 and low vitamin D.  Continue B12 injections and vitamin D supplements.  Follow-up date to be determined after lab review.

## 2020-06-05 NOTE — Progress Notes (Signed)
Subjective:    Patient ID: Olivia Hodge, female    DOB: 1950-06-13, 70 y.o.   MRN: [id number]  HPI   Pt in for check up.  Pt has hx of high cholesterol. Pt is on zetia.   Pt has hypothyoidism. 2 weeks ago tsh 0.98. Pt is on synthroid 150 mcg.  Pt has hx of allergic rhinitis. Pt is on astelin, flonase and zyrtec.  Pt states willing to try montelukast. On review had been on past. No side effects remembered and none documented.  Hx of elevated sugar in past but not diabetic.  Hx of depression. Controlled with sertraline 100 mg daily for years now.   Hx of smoking. 30 year smoker about pack a day before she quit. 70 yo to 49 you and about pack a day.  Also pt reports twice past 2 months will get some left lower quadrant pain that will last 2 days. No fevers or chills. Last event was 2 weeks ago. No pain presently. Pt associated with constipation. On colonoscopy report has diverticlosis mainly in sigmoid colon.333     Review of Systems  Constitutional: Negative for chills, fatigue and fever.  Respiratory: Negative for cough, choking, shortness of breath and wheezing.   Cardiovascular: Negative for chest pain and palpitations.  Gastrointestinal: Negative for abdominal pain, diarrhea and nausea.  Musculoskeletal: Negative for back pain, joint swelling, neck pain and neck stiffness.  Skin: Negative for rash.  Neurological: Negative for dizziness, syncope, weakness, numbness and headaches.  Hematological: Negative for adenopathy. Does not bruise/bleed easily.  Psychiatric/Behavioral: Negative for behavioral problems, confusion, dysphoric mood and suicidal ideas. The patient is not nervous/anxious.        Mood controlled.    Past Medical History:  Diagnosis Date  . Allergic state 08/12/2016  . Allergy   . B12 deficiency   . Blood transfusion without reported diagnosis   . Cataract    removed both eyes   . Diverticulitis 2015  . GERD (gastroesophageal reflux disease)    occ  in the past   . Hemorrhoids   . History of chicken pox   . Hyperlipidemia   . Melanoma of skin ([place])   . Thyroid disease   . Vitamin D deficiency 05/11/2015     Social History   Socioeconomic History  . Marital status: Divorced    Spouse name: Not on file  . Number of children: Not on file  . Years of education: Not on file  . Highest education level: Not on file  Occupational History  . Not on file  Tobacco Use  . Smoking status: Former Smoker    Quit date: 02/26/1992    Years since quitting: 28.2  . Smokeless tobacco: Never Used  Vaping Use  . Vaping Use: Never used  Substance and Sexual Activity  . Alcohol use: Yes    Comment: wine daily   . Drug use: No  . 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 Not on file  Social History Narrative  . Not on file   Social Determinants of Health   Financial Resource Strain: Low Risk   . Difficulty of Paying Living Expenses: Not hard at all  Food Insecurity: No Food Insecurity  . Worried About Charity fundraiser in the Last Year: Never true  . Ran Out of Food in the Last Year: Never true  Transportation  Needs: No Transportation Needs  . Lack of Transportation (Medical): No  . Lack of Transportation (Non-Medical): No  Physical Activity: Not on file  Stress: Not on file  Social Connections: Not on file  Intimate Partner Violence: Not on file    Past Surgical History:  Procedure Laterality Date  . APPENDECTOMY    . CATARACT EXTRACTION, BILATERAL Bilateral   . COLONOSCOPY     ~ 10 yrs ago - woke up during colon at [place]   . EYE SURGERY Bilateral 2018   cataracts, Dr Gillian Scarce  . SHOULDER SURGERY Right 04/10/2017   rotator cuff repair    Family History  Adopted: Yes  Problem Relation Age of Onset  . COPD Daughter   . Birth defects Daughter        recurrent bronchitis  . Ulcers Daughter   . ADD / ADHD Son    . Colon cancer Neg Hx   . Esophageal cancer Neg Hx   . Pancreatic cancer Neg Hx   . Prostate cancer Neg Hx   . Rectal cancer Neg Hx   . Stomach cancer Neg Hx     Allergies  Allergen Reactions  . Penicillins Hives  . Tetracyclines & Related Nausea And Vomiting  . Erythromycin Nausea Only    Makes patient really sick    Current Outpatient Medications on File Prior to Visit  Medication Sig Dispense Refill  . azelastine (ASTELIN) 0.1 % nasal spray SPRAY TWICE IN EACH NOSTRIL TWICE DAILY 30 mL 4  . Bepotastine Besilate 1.5 % SOLN     . cetirizine (ZYRTEC) 10 MG tablet Take by mouth.    . cholecalciferol (VITAMIN D3) 25 MCG (1000 UNIT) tablet Take 1,000 Units by mouth daily.    . Cyanocobalamin (VITAMIN B 12 PO) Take by mouth. B 12 injections once a month    . ezetimibe (ZETIA) 10 MG tablet TAKE 1 TABLET DAILY 90 tablet 3  . fluticasone (FLONASE) 50 MCG/ACT nasal spray Place 2 sprays into both nostrils daily. 16 g 1  . loratadine (CLARITIN) 10 MG tablet Take 10 mg by mouth daily.    . NON FORMULARY Doterra supplements    . Probiotic Product (PROBIOTIC DAILY PO) Take 1 tablet by mouth daily.    . sertraline (ZOLOFT) 100 MG tablet TAKE 1 TABLET DAILY 90 tablet 3  . SYNTHROID 150 MCG tablet Take 150 mcg by mouth daily.     No current facility-administered medications on file prior to visit.    BP (!) 141/84   Pulse 80   Temp 98.2 F (36.8 C)   Resp 18   Ht 5\' 9"  (1.753 m)   Wt 221 lb 9.6 oz (100.5 kg)   SpO2 97%   BMI 32.72 kg/m       Objective:   Physical Exam  General Mental Status- Alert. General Appearance- Not in acute distress.   Skin General: Color- Normal Color. Moisture- Normal Moisture.  Neck Carotid Arteries- Normal color. Moisture- Normal Moisture. No carotid bruits. No JVD.  Chest and Lung Exam Auscultation: Breath Sounds:-Normal.  Cardiovascular Auscultation:Rythm- Regular. Murmurs & Other Heart Sounds:Auscultation of the heart reveals- No  Murmurs.  Abdomen Inspection:-Inspeection Normal. Palpation/Percussion:Note:No mass. Palpation and Percussion of the abdomen reveal- Non Tender, Non Distended + BS, no rebound or guarding.   Neurologic Cranial Nerve exam:- CN III-XII intact(No nystagmus), symmetric smile. Strength:- 5/5 equal and symmetric strength both upper and lower extremities.      Assessment & Plan:  History of hyperlipidemia and  on Zetia presently.  Will get lipid panel today.  Mild elevated sugars in the past.  Will get A1c/74-month sugar average test today and recommend low sugar diet.  History of hypothyroidism with end range TSH done by endocrinologist.  Continue current dose of Synthroid.  Seasonal allergies not adequately treated presently with Zyrtec, Astelin and Flonase.  Do recommend adding montelukast 10 mg at night.  Hopefully this will lessen your symptoms.  Recent intermittent abdomen pain left lower quadrant region of sigmoid colon.  On review of last colonoscopy diverticulosis throughout colon but worse in sigmoid colon region.  No current pain presently.  However do recommend that if you do have recurrent pain in the future please let us know.  In that event would prescribe Cipro and Flagyl.  If not getting improvement with treatment then would recommend advancing to CT abdomen and pelvis.  History of low B12 and low vitamin D.  Continue B12 injections and vitamin D supplements.  Follow-up date to be determined after lab review.  Mackie Pai, PA-C   Time spent with patient today was 40  minutes which consisted of chart review, discussing diagnoses, work up treatment and documentation.

## 2020-06-05 NOTE — Progress Notes (Signed)
   Covid-19 Vaccination Clinic  Name:  Olivia Hodge    MRN: [id number] DOB: 1950-11-23  06/05/2020  Olivia Hodge was observed post Covid-19 immunization for 15 minutes without incident. She was provided with Vaccine Information Sheet and instruction to access the V-Safe system.   Olivia Hodge was instructed to call 911 with any severe reactions post vaccine: Marland Kitchen Difficulty breathing  . Swelling of face and throat  . A fast heartbeat  . A bad rash all over body  . Dizziness and weakness   Immunizations Administered    Name Date Dose VIS Date Route   PFIZER Comrnaty(Gray TOP) Covid-19 Vaccine 06/05/2020 11:31 AM 0.3 mL 02/03/2020 Intramuscular   Manufacturer: [employer]   Lot: [id number]   [place]: [phone]

## 2020-06-07 ENCOUNTER — Encounter: Payer: Medicare Other | Admitting: Medical

## 2020-06-09 ENCOUNTER — Other Ambulatory Visit (HOSPITAL_BASED_OUTPATIENT_CLINIC_OR_DEPARTMENT_OTHER): Payer: Self-pay

## 2020-06-09 MED ORDER — PFIZER-BIONT COVID-19 VAC-TRIS 30 MCG/0.3ML IM SUSP
INTRAMUSCULAR | 0 refills | Status: DC
  Filled 2020-06-09: qty 0.3, 1d supply, fill #0

## 2020-07-05 ENCOUNTER — Other Ambulatory Visit: Payer: Self-pay

## 2020-07-05 ENCOUNTER — Ambulatory Visit (INDEPENDENT_AMBULATORY_CARE_PROVIDER_SITE_OTHER): Payer: Medicare Other

## 2020-07-05 ENCOUNTER — Ambulatory Visit (HOSPITAL_BASED_OUTPATIENT_CLINIC_OR_DEPARTMENT_OTHER)
Admission: RE | Admit: 2020-07-05 | Discharge: 2020-07-05 | Disposition: A | Discharge status: Home / Self Care | Payer: Medicare Other | Source: Ambulatory Visit | Attending: Medical | Admitting: Medical

## 2020-07-05 DIAGNOSIS — Z87891 Personal history of nicotine dependence: Secondary | ICD-10-CM | POA: Insufficient documentation

## 2020-07-05 DIAGNOSIS — E538 Deficiency of other specified B group vitamins: Secondary | ICD-10-CM | POA: Diagnosis not present

## 2020-07-05 MED ORDER — CYANOCOBALAMIN 1000 MCG/ML IJ SOLN
1000.0000 ug | Freq: Once | INTRAMUSCULAR | Status: AC
  Administered 2020-07-05: 1000 ug via INTRAMUSCULAR

## 2020-07-05 NOTE — Progress Notes (Addendum)
Pt here today for monthly B12 injection.  Cyanocobalamin 1043mcg/mL injected into L deltoid. Pt tolerated well.   Next 1 month. Nurse visit scheduled 08/09/2020.    Mackie Pai, PA-C

## 2020-07-06 ENCOUNTER — Telehealth: Payer: Self-pay | Admitting: Medical

## 2020-07-06 NOTE — Telephone Encounter (Signed)
Insurance called stating on 04/11 the claim was not submitted as a physical. It was sent to them as an office visit. Patient was billed for Vitamin b12 & Therapb injection. Insurance also stated that the labs were not sent over as preventive either. Patient either got a bill or an EOB stating payment is due. I did explain that patient did have other things discussed in her visit that was outside of the normal cpe with Abdominal concerns. Insurance also stated that the labs submitted was not submitted as  Preventive either.  I did explain the injection and admin fee to insurance but she states it was billed to them wrong and nothing on the billing shows patient had a physical. She is requesting patient get  a call back to discuss.

## 2020-07-07 NOTE — Telephone Encounter (Signed)
I have Radio producer asking them to please reach out to patient regarding billing concern.

## 2020-07-12 ENCOUNTER — Ambulatory Visit: Payer: Medicare Other

## 2020-07-12 ENCOUNTER — Telehealth (INDEPENDENT_AMBULATORY_CARE_PROVIDER_SITE_OTHER): Payer: Medicare Other | Admitting: Medical

## 2020-07-12 ENCOUNTER — Encounter: Payer: Self-pay | Admitting: Medical

## 2020-07-12 ENCOUNTER — Other Ambulatory Visit: Payer: Self-pay

## 2020-07-12 ENCOUNTER — Ambulatory Visit (HOSPITAL_BASED_OUTPATIENT_CLINIC_OR_DEPARTMENT_OTHER): Payer: Medicare Other

## 2020-07-12 DIAGNOSIS — R911 Solitary pulmonary nodule: Secondary | ICD-10-CM

## 2020-07-12 DIAGNOSIS — J432 Centrilobular emphysema: Secondary | ICD-10-CM | POA: Diagnosis not present

## 2020-07-12 DIAGNOSIS — K219 Gastro-esophageal reflux disease without esophagitis: Secondary | ICD-10-CM | POA: Diagnosis not present

## 2020-07-12 DIAGNOSIS — R0600 Dyspnea, unspecified: Secondary | ICD-10-CM

## 2020-07-12 DIAGNOSIS — E782 Mixed hyperlipidemia: Secondary | ICD-10-CM

## 2020-07-12 MED ORDER — BUDESONIDE-FORMOTEROL FUMARATE 160-4.5 MCG/ACT IN AERO: 2.0000 | INHALATION_SPRAY | Freq: Two times a day (BID) | RESPIRATORY_TRACT | 0 refills | Status: DC

## 2020-07-12 NOTE — Progress Notes (Signed)
   Subjective:    Patient ID: Olivia Hodge, female    DOB: [birth year], 70 y.o.   MRN: [id number]  HPI  Virtual Visit via Video Note  I connected with Burlene Arnt on 07/12/20 at 10:00 AM EDT by a video enabled telemedicine application and verified that I am speaking with the correct person using two identifiers.  Location: Patient: home Provider: office   I discussed the limitations of evaluation and management by telemedicine and the availability of in person appointments. The patient expressed understanding and agreed to proceed.  History of Present Illness: Pt did review her ct chest scan.  Pt has hiatal hernia on ct scan but rare reflux. She uses tums about once every 2 weeks.   Some mild emphysema type changes. Get sob with mild activity but no obvious wheezing.   Pt is on zetia. Her lipid panel numbers have improved. Coronary calcification on CT. Pt reluctant to use statins.     Observations/Objective:  General-no acute distress, pleasant, oriented. Lungs- on inspection lungs appear unlabored. Neck- no tracheal deviation or jvd on inspection. Neuro- gross motor function appears intact.   Assessment and Plan: Recent emphysema seen on CT chest.  History of smoking and mild dyspnea on exertion at times.  After discussion with patient decided to go ahead and prescribe Symbicort inhaler and see how she responds.  Lung nodule seen on CT chest as well.  Not worrisome presently.  Repeat annual screening CT chest in 1 year.  History of mild intermittent GERD about once every 2 weeks.  Hiatal hernia seen on CT chest.  Can use Tums as needed.  If reflux type symptoms are getting more frequent then will prescribe famotidine.  Patient will update his if she wants that prescription.  History of hyperlipidemia and coronary calcification seen on CT.  Patient is on Zetia and her lipid panel has improved.  Reluctant to use statin medication.  Counseled briefly that in light of  findings statin option in the future if needed.   Follow-up late September or early October for an office visit.  We will repeat labs on that visit.  Mackie Pai, PA-C   Time spent with patient today was 20  minutes which consisted of chart review, discussing diagnosis, work up, treatment and documentation.  Follow Up Instructions:    I discussed the assessment and treatment plan with the patient. The patient was provided an opportunity to ask questions and all were answered. The patient agreed with the plan and demonstrated an understanding of the instructions.   The patient was advised to call back or seek an in-person evaluation if the symptoms worsen or if the condition fails to improve as anticipated.  Time spent with patient today was   minutes which consisted of chart revdiew, discussing diagnosis, work up treatment and documentation.   Mackie Pai, PA-C   Review of Systems     Objective:   Physical Exam        Assessment & Plan:

## 2020-07-12 NOTE — Patient Instructions (Signed)
Recent emphysema seen on CT chest.  History of smoking and mild dyspnea on exertion at times.  After discussion with patient decided to go ahead and prescribe Symbicort inhaler and see how she responds.  Lung nodule seen on CT chest as well.  Not worrisome presently.  Repeat annual screening CT chest in 1 year.  History of mild intermittent GERD about once every 2 weeks.  Hiatal hernia seen on CT chest.  Can use Tums as needed.  If reflux type symptoms are getting more frequent then will prescribe famotidine.  Patient will update his if she wants that prescription.  History of hyperlipidemia and coronary calcification seen on CT.  Patient is on Zetia and her lipid panel has improved.  Reluctant to use statin medication.  Counseled briefly that in light of findings statin option in the future if needed.   Follow-up late September or early October for an office visit.  We will repeat labs on that visit.

## 2020-07-13 ENCOUNTER — Telehealth: Payer: Self-pay | Admitting: Medical

## 2020-07-13 DIAGNOSIS — J432 Centrilobular emphysema: Secondary | ICD-10-CM

## 2020-07-13 NOTE — Telephone Encounter (Signed)
Referral to pulmonologist placed. 

## 2020-08-09 ENCOUNTER — Ambulatory Visit: Payer: Medicare Other | Admitting: *Deleted

## 2020-08-09 ENCOUNTER — Ambulatory Visit: Payer: Medicare Other

## 2020-08-17 ENCOUNTER — Ambulatory Visit: Payer: Medicare Other

## 2020-08-23 ENCOUNTER — Ambulatory Visit (INDEPENDENT_AMBULATORY_CARE_PROVIDER_SITE_OTHER): Payer: Medicare Other

## 2020-08-23 ENCOUNTER — Telehealth: Payer: Self-pay | Admitting: Medical

## 2020-08-23 ENCOUNTER — Other Ambulatory Visit: Payer: Self-pay

## 2020-08-23 DIAGNOSIS — E538 Deficiency of other specified B group vitamins: Secondary | ICD-10-CM | POA: Diagnosis not present

## 2020-08-23 MED ORDER — CYANOCOBALAMIN 1000 MCG/ML IJ SOLN
1000.0000 ug | Freq: Once | INTRAMUSCULAR | Status: AC
  Administered 2020-08-23: 1000 ug via INTRAMUSCULAR

## 2020-08-23 MED ORDER — SYNTHROID 150 MCG PO TABS
150.0000 ug | ORAL_TABLET | Freq: Every day | ORAL | 2 refills | Status: DC
Start: 2020-08-23 — End: 2021-08-06

## 2020-08-23 NOTE — Telephone Encounter (Signed)
Rx sent 

## 2020-08-23 NOTE — Progress Notes (Addendum)
Pt is here today for B12 injection. Pt was given B12 injection in left deltoid. Pt tolerated well.  Pt is scheduled for September 22, 2020.  Mackie Pai, PA-C

## 2020-08-23 NOTE — Telephone Encounter (Signed)
Pt came in office stating needing refill on SYNTHROID 150 MCG tablet send to [facility] [place]  [address] [place] [address]  Phone:  [phone]  Fax:  [phone] Please advise ASAP.

## 2020-09-18 ENCOUNTER — Other Ambulatory Visit (HOSPITAL_BASED_OUTPATIENT_CLINIC_OR_DEPARTMENT_OTHER): Payer: Self-pay

## 2020-09-20 ENCOUNTER — Other Ambulatory Visit: Payer: Self-pay

## 2020-09-20 ENCOUNTER — Ambulatory Visit (INDEPENDENT_AMBULATORY_CARE_PROVIDER_SITE_OTHER): Payer: Medicare Other

## 2020-09-20 DIAGNOSIS — E538 Deficiency of other specified B group vitamins: Secondary | ICD-10-CM | POA: Diagnosis not present

## 2020-09-20 MED ORDER — CYANOCOBALAMIN 1000 MCG/ML IJ SOLN
1000.0000 ug | Freq: Once | INTRAMUSCULAR | Status: AC
  Administered 2020-09-20: 1000 ug via INTRAMUSCULAR

## 2020-09-20 NOTE — Progress Notes (Addendum)
Pt is here today for B12 injection. Pt was given b12 injection in left deltoid . Pt tolerated well.  Mackie Pai, PA-C

## 2020-09-22 ENCOUNTER — Ambulatory Visit: Payer: Medicare Other

## 2020-10-25 ENCOUNTER — Ambulatory Visit: Payer: Medicare Other

## 2020-10-30 ENCOUNTER — Other Ambulatory Visit: Payer: Self-pay | Admitting: Medical

## 2020-11-06 NOTE — Progress Notes (Addendum)
Olivia Hodge is a 70 y.o. female presents to the office today for B12 injections, per physician's orders. Original order: 06/05/20- Percell Miller Saguier, PA-C B12 1000 mcg IM was administered IM Left Deltoid today. Patient tolerated injection. Patient due for follow up labs/provider appt: yes.- Per lab results 06/05/20 "Continue current meds. Follow up in 6 months or as needed"  Needs f/u with PCP, Appointment made: No-pt says she isn't due for f/u yet.   Creft, Margie Ege, PA-C

## 2020-11-07 ENCOUNTER — Other Ambulatory Visit: Payer: Self-pay

## 2020-11-07 ENCOUNTER — Ambulatory Visit (INDEPENDENT_AMBULATORY_CARE_PROVIDER_SITE_OTHER): Payer: Medicare Other

## 2020-11-07 DIAGNOSIS — E538 Deficiency of other specified B group vitamins: Secondary | ICD-10-CM | POA: Diagnosis not present

## 2020-11-07 MED ORDER — CYANOCOBALAMIN 1000 MCG/ML IJ SOLN
1000.0000 ug | Freq: Once | INTRAMUSCULAR | Status: AC
  Administered 2020-11-07: 1000 ug via INTRAMUSCULAR

## 2020-12-13 ENCOUNTER — Other Ambulatory Visit (INDEPENDENT_AMBULATORY_CARE_PROVIDER_SITE_OTHER): Payer: Medicare Other

## 2020-12-13 ENCOUNTER — Ambulatory Visit (INDEPENDENT_AMBULATORY_CARE_PROVIDER_SITE_OTHER): Payer: Medicare Other | Admitting: *Deleted

## 2020-12-13 ENCOUNTER — Other Ambulatory Visit: Payer: Self-pay

## 2020-12-13 DIAGNOSIS — E538 Deficiency of other specified B group vitamins: Secondary | ICD-10-CM | POA: Diagnosis not present

## 2020-12-13 LAB — VITAMIN B12: Vitamin B-12: 316 pg/mL (ref 211–911)

## 2020-12-13 MED ORDER — CYANOCOBALAMIN 1000 MCG/ML IJ SOLN
1000.0000 ug | Freq: Once | INTRAMUSCULAR | Status: AC
  Administered 2020-12-13: 1000 ug via INTRAMUSCULAR

## 2020-12-13 NOTE — Progress Notes (Addendum)
Patient here for monthly b12 injection.  Patient last b12 checked 05/07/19.  Needs recheck today and patient did recheck labs today.  Injection given in left deltoid and patient tolerated well.  Did not schedule next appointment as we need wait for lab work.  Mackie Pai, PA-C

## 2021-02-20 ENCOUNTER — Telehealth: Payer: Medicare Other | Admitting: Medical

## 2021-02-20 ENCOUNTER — Telehealth: Payer: Self-pay

## 2021-02-20 NOTE — Telephone Encounter (Signed)
urse Assessment Nurse: Malena Peer, RN, Edwena Felty Date/Time Eilene Ghazi Time): 02/18/2021 11:59:38 AM Confirm and document reason for call. If symptomatic, describe symptoms. ---Caller says that she tested positive for COVID with a rapid home test today. She started with cold symptoms yesterday. She has a cough, sore throat, and fever of 100.2 orally. She also has sinus pain and back achiness. Does the patient have any new or worsening symptoms? ---Yes Will a triage be completed? ---Yes Related visit to physician within the last 2 weeks? ---No Does the PT have any chronic conditions? (i.e. diabetes, asthma, this includes High risk factors for pregnancy, etc.) ---Yes List chronic conditions. ---hypothyroidism Is this a behavioral health or substance abuse call? ---No Guidelines Guideline Title Affirmed Question Affirmed Notes Nurse Date/Time (Eastern Time) COVID-19 - Diagnosed or Suspected [1] HIGH RISK for severe COVID complications (e.g., weak immune system, age > 108 years, obesity with BMI 30 or higher, pregnant, chronic lung disease or other Malena Peer, RN, Edwena Felty 02/18/2021 12:03:55 PM PLEASE NOTE: All timestamps contained within this report are represented as [place] Standard Time. CONFIDENTIALTY NOTICE: This fax transmission is intended only for the addressee. It contains information that is legally privileged, confidential or otherwise protected from use or disclosure. If you are not the intended recipient, you are strictly prohibited from reviewing, disclosing, copying using or disseminating any of this information or taking any action in reliance on or regarding this information. If you have received this fax in error, please notify us immediately by telephone so that we can arrange for its return to [place]. Phone: [phone], Toll-Free: [phone], Fax: [phone] Page: 2 of 3 Call Id: [id number] Guidelines Guideline Title Affirmed Question Affirmed Notes Nurse Date/Time  Eilene Ghazi Time) chronic medical condition) AND [2] COVID symptoms (e.g., cough, fever) (Exceptions: Already seen by PCP and no new or worsening symptoms.) Disp. Time Eilene Ghazi Time) Disposition Final User 02/18/2021 12:10:10 PM Call PCP within 24 Hours Yes Strick, RN, Osie Cheeks Disagree/Comply Comply Caller Understands Yes PreDisposition Did not know what to do Care Advice Given Per Guideline CALL PCP WITHIN 24 HOURS: * You need to discuss this with your doctor (or NP/PA) within the next 24 hours. * IF OFFICE WILL BE OPEN: Call the office when it opens tomorrow morning. GENERAL CARE ADVICE FOR COVID-19 SYMPTOMS: * The symptoms are generally treated the same whether you have COVID-19, influenza or some other respiratory virus. * Cough: Use cough drops. * Feeling dehydrated: Drink extra liquids. If the air in your home is dry, use a humidifier. * Fever: For fever over 101 F (38.3 C), take acetaminophen every 4 to 6 hours (Adults 650 mg) OR ibuprofen every 6 to 8 hours (Adults 400 mg). Before taking any medicine, read all the instructions on the package. Do not take aspirin unless your doctor has prescribed it for you. * Muscle aches, headache, and other pains: Often this comes and goes with the fever. Take acetaminophen every 4 to 6 hours (Adults 650 mg) OR ibuprofen every 6 to 8 hours (Adults 400 mg). Before taking any medicine, read all the instructions on the package. * Sore throat: Try throat lozenges, hard candy or warm chicken broth. COUGH MEDICINES: * COUGH DROPS: Over-the-counter cough drops can help a lot, especially for mild coughs. They soothe an irritated throat and remove the tickle sensation in the back of the throat. Cough drops are easy to carry with you. * COUGH SYRUP WITH DEXTROMETHORPHAN: An over-the-counter cough syrup can help your cough. The most common cough suppressant in over-the-counter  cough medicines is dextromethorphan. * HOME REMEDY - HARD CANDY: Hard  candy works just as well as over-the-counter cough drops. People who have diabetes should use sugar-free candy. * HOME REMEDY - HONEY: This old home remedy has been shown to help decrease coughing at night. The adult dosage is 2 teaspoons (10 ml) at bedtime. COUGH SYRUP WITH DEXTROMETHORPHAN: * Cough syrups containing the cough suppressant dextromethorphan may help decrease your cough. COVID-19 - HOW TO PROTECT OTHERS - WHEN YOU ARE SICK WITH COVID-19: * STAY HOME A MINIMUM OF 5 DAYS: People with MILD COVID-19 can STOP HOME ISOLATION AFTER 5 DAYS if (1) fever has been gone for 24 hours (without using fever medicine) AND (2) symptoms are better. Continue to wear a well-fitted mask for a full 10 days when around others. * AVOID TRAVEL: Avoid travel for 10 days after you tested positive for COVID-19. * [place] HANDS OFTEN: Wash hands often with soap and water. After coughing or sneezing are important times. If soap and water are not available, use an alcohol-based hand sanitizer with at least 60% alcohol, covering all surfaces of your hands and rubbing them together until they feel dry. Avoid touching your eyes, nose, and mouth with unwashed hands. * CALL AHEAD IF MEDICAL CARE IS NEEDED: If you have a medical appointment, call your doctor's office and tell them you have or may have COVID-19. This will help the office protect themselves and other patients. They will give you directions. CALL BACK IF: * You become worse CARE ADVICE given per COVID-19 - DIAGNOSED OR SUSPECTED (Adult) guideline. PLEASE NOTE: All timestamps contained within this report are represented as [place] Standard Time. CONFIDENTIALTY NOTICE: This fax transmission is intended only for the addressee. It contains information that is legally privileged, confidential or otherwise protected from use or disclosure. If you are not the intended recipient, you are strictly prohibited from reviewing, disclosing, copying using or disseminating any  of this information or taking any action in reliance on or regarding this information. If you have received this fax in error, please notify us immediately by telephone so that we can arrange for its return to [place]. Phone: [phone], Toll-Free: [phone], Fax: [phone] Page: 3 of 3 Call Id: [id number] Referrals REFERRED TO PCP OFFICE

## 2021-02-20 NOTE — Telephone Encounter (Signed)
Called pt and lvm to return call to get her on schedule

## 2021-02-20 NOTE — Telephone Encounter (Signed)
Pt called back to cancel appointment , stated she would like to cancel , she got medication from [facility]

## 2021-04-26 ENCOUNTER — Other Ambulatory Visit: Payer: Self-pay | Admitting: Medical

## 2021-05-31 ENCOUNTER — Other Ambulatory Visit: Payer: Self-pay | Admitting: Medical

## 2021-05-31 DIAGNOSIS — E210 Primary hyperparathyroidism: Secondary | ICD-10-CM

## 2021-05-31 DIAGNOSIS — E538 Deficiency of other specified B group vitamins: Secondary | ICD-10-CM

## 2021-05-31 DIAGNOSIS — E785 Hyperlipidemia, unspecified: Secondary | ICD-10-CM

## 2021-05-31 DIAGNOSIS — E039 Hypothyroidism, unspecified: Secondary | ICD-10-CM

## 2021-05-31 DIAGNOSIS — E559 Vitamin D deficiency, unspecified: Secondary | ICD-10-CM

## 2021-06-21 ENCOUNTER — Other Ambulatory Visit: Payer: Self-pay | Admitting: *Deleted

## 2021-06-22 ENCOUNTER — Other Ambulatory Visit: Payer: Self-pay | Admitting: *Deleted

## 2021-06-22 ENCOUNTER — Ambulatory Visit (INDEPENDENT_AMBULATORY_CARE_PROVIDER_SITE_OTHER): Payer: Medicare Other | Admitting: Internal Medicine

## 2021-06-22 DIAGNOSIS — R0600 Dyspnea, unspecified: Secondary | ICD-10-CM | POA: Diagnosis not present

## 2021-06-22 DIAGNOSIS — R0602 Shortness of breath: Secondary | ICD-10-CM | POA: Diagnosis not present

## 2021-06-22 LAB — PULMONARY FUNCTION TEST
DL/VA % pred: 94 %
DL/VA: 3.75 ml/min/mmHg/L
DLCO unc % pred: 88 %
DLCO unc: 20.22 ml/min/mmHg
FEF 25-75 Post: 3.91 L/sec
FEF 25-75 Pre: 2.69 L/sec
FEF2575-%Change-Post: 45 %
FEF2575-%Pred-Post: 181 %
FEF2575-%Pred-Pre: 124 %
FEV1-%Change-Post: 11 %
FEV1-%Pred-Post: 98 %
FEV1-%Pred-Pre: 88 %
FEV1-Post: 2.7 L
FEV1-Pre: 2.42 L
FEV1FVC-%Change-Post: 6 %
FEV1FVC-%Pred-Pre: 106 %
FEV6-%Change-Post: 5 %
FEV6-%Pred-Post: 91 %
FEV6-%Pred-Pre: 87 %
FEV6-Post: 3.15 L
FEV6-Pre: 3 L
FEV6FVC-%Pred-Post: 104 %
FEV6FVC-%Pred-Pre: 104 %
FVC-%Change-Post: 5 %
FVC-%Pred-Post: 87 %
FVC-%Pred-Pre: 83 %
FVC-Post: 3.15 L
FVC-Pre: 3 L
Post FEV1/FVC ratio: 86 %
Post FEV6/FVC ratio: 100 %
Pre FEV1/FVC ratio: 81 %
Pre FEV6/FVC Ratio: 100 %
RV % pred: 88 %
RV: 2.18 L
TLC % pred: 96 %
TLC: 5.61 L

## 2021-06-22 NOTE — Progress Notes (Signed)
Performed full PFT today. ?

## 2021-06-22 NOTE — Therapy (Signed)
?OUTPATIENT PHYSICAL THERAPY VESTIBULAR EVALUATION ? ? ? ? ?Patient Name: Olivia Hodge ?MRN: [id number] ?DOB:1950/07/07, 71 y.o., female ?Today's Date: 06/25/2021 ? ?PCP: Mackie Pai, PA-C ?REFERRING PROVIDER: Charlane Ferretti, MD ? ? PT End of Session - 06/25/21 1515   ? ? Visit Number 1   ? Number of Visits 9   ? Date for PT Re-Evaluation 07/23/21   ? Authorization Type UHC Medicare   ? PT Start Time 1519   ? PT Stop Time 1616   ? PT Time Calculation (min) 57 min   ? Activity Tolerance Patient tolerated treatment well;Other (comment)   dizziness, nausea  ? Behavior During Therapy [facility] for tasks assessed/performed   ? ?  ?  ? ?  ? ? ?Past Medical History:  ?Diagnosis Date  ? Allergic state 08/12/2016  ? Allergy   ? B12 deficiency   ? Blood transfusion without reported diagnosis   ? Cataract   ? removed both eyes   ? Diverticulitis 2015  ? GERD (gastroesophageal reflux disease)   ? occ in the past   ? Hemorrhoids   ? History of chicken pox   ? Hyperlipidemia   ? Melanoma of skin ([place])   ? Thyroid disease   ? Vitamin D deficiency 05/11/2015  ? ?Past Surgical History:  ?Procedure Laterality Date  ? APPENDECTOMY    ? CATARACT EXTRACTION, BILATERAL Bilateral   ? COLONOSCOPY    ? ~ 10 yrs ago - woke up during colon at [facility]   ? EYE SURGERY Bilateral 2018  ? cataracts, Dr Gillian Scarce  ? SHOULDER SURGERY Right 04/10/2017  ? rotator cuff repair  ? ?Patient Active Problem List  ? Diagnosis Date Noted  ? Right shoulder pain 08/20/2017  ? Allergic state 08/12/2016  ? Vitamin D deficiency 05/11/2015  ? History of chicken pox   ? Shingles 12/22/2014  ? Hyperparathyroidism, primary ([place]) 07/01/2014  ? Preventative health care 05/25/2014  ? Cervical cancer screening 05/25/2014  ? B12 deficiency 05/25/2014  ? Diverticulitis of colon 02/21/2014  ? Abdominal pain, left lower quadrant 02/16/2014  ? Numbness 01/04/2014  ? Hypothyroidism 12/21/2013  ? Depression 12/21/2013  ? Obesity (BMI 30-39.9) 12/21/2013  ? Hyperlipidemia 12/21/2013  ?  Elevated glucose 12/21/2013  ? Muscle fatigue 12/21/2013  ? Melanoma of skin ([place]) 12/21/2013  ? ? ?ONSET DATE: 06/15/21 ? ?REFERRING DIAG: Benign paroxysmal vertigo, unspecified ear ? ?THERAPY DIAG:  ?BPPV (benign paroxysmal positional vertigo), bilateral ? ?Dizziness and giddiness - Plan: PT plan of care cert/re-cert ? ?Unsteadiness on feet - Plan: PT plan of care cert/re-cert ? ?SUBJECTIVE:  ? ?SUBJECTIVE STATEMENT: ?Patient reports dizziness started on 06/15/21. Had the same problem 2-3 months ago and it went away on its own. Dizziness is described as "spinning, nausea" and lasts 15-20 seconds.  Has had episodes of vomiting and nausea from the vertigo. Taking Meclizine with improvement in nausea. Worse with quick movements such as turning head, looking down, laying down. Better when sleeping on a wedge. Denies head trauma, infection/illness, vision changes/double vision, hearing loss, tinnitus, otalgia, migraines.  ?Pt accompanied by: self ? ?PERTINENT HISTORY: GERD, HLD, R RTC repair 2019; reports self-resolving vertigo 2-3 months ago ? ? ?PAIN:  ?Are you having pain? No ? ?PRECAUTIONS: None ? ?WEIGHT BEARING RESTRICTIONS No ? ?FALLS: Has patient fallen in last 6 months? No ? ?LIVING ENVIRONMENT: ?Lives with: lives with their spouse ?Lives in: House/apartment ?Stairs: Yes: Internal: 4 +12 steps; on right going up and on left going up  and External: 15 steps; on right going up and on left going up ?Has following equipment at home: Crutches ? ?PLOF: Independent ? ?PATIENT GOALS  improve dizziness ? ?OBJECTIVE:  ? ?DIAGNOSTIC FINDINGS: none recent ? ?COGNITION: ?Overall cognitive status: Within functional limits for tasks assessed ?  ?SENSATION: ?WFL ? ? ?POSTURE: No Significant postural limitations ? ? ?GAIT: ?Gait pattern:  guarded without trunk or head rotation, small step length ? ?Assistive device utilized: none ? ? ?VESTIBULAR ASSESSMENT ? ? ? OCULOMOTOR EXAM: ?  Ocular Alignment: normal ?  Ocular ROM: No  Limitations ?  Spontaneous Nystagmus: absent ?  Gaze-Induced Nystagmus: absent ?  Smooth Pursuits: intact ?  Saccades: slow c/o feeling off ?  ? ? ? VESTIBULAR - OCULAR REFLEX:  ?  Slow VOR: Comment: very slow horizontal and vertical and c/o dizziness and neck pain ;  ?  VOR Cancellation: Comment: normal but c/o dizziness ?  Head-Impulse Test: HIT Right: negative but with c/o dizziness ?HIT Left: positive; c/o dizziness ?   ?  ? POSITIONAL TESTING: Right Dix-Hallpike: downbeating, left nystagmus; Duration:unfatiguable ?Left Dix-Hallpike: downbeating, left nystagmus; Duration: ~30 sec; limited by nausea ?Right Roll Test: downbeating R torsional unfatiguable; Duration:   ?Left Roll Test: 15 sec upbeating L torsional followed by unfatigeable downbeating R torsional; Duration:   ?  ? ? ?VESTIBULAR TREATMENT: ? ?Canalith Repositioning: ?Modified liberatory maneuver for L AC Cupulothiasis x2- c/o dizziness upon position 2 and 4 and no improvement in nystagmus as it was still unfatiguable  ? ?PATIENT EDUCATION: ?Education details: prognosis, POC, edu on BPPV and inner ear anatomy ?Person educated: Patient ?Education method: Explanation ?Education comprehension: verbalized understanding ? ? ?GOALS: ?Goals reviewed with patient? Yes ? ?SHORT TERM GOALS: Target date: 07/16/2021 ? ?Patient to be independent with initial HEP. ?Baseline: HEP initiated ?Goal status: INITIAL ? ? ? ?LONG TERM GOALS: Target date: 07/23/2021 ? ?Patient to be independent with advanced HEP. ?Baseline: Not yet initiated  ?Goal status: INITIAL ? ?Patient to report 0/10 dizziness with standing vertical and horizontal VOR for 30 seconds. ?Baseline: Unable ?Goal status: INITIAL ? ?Patient will report 0/10 dizziness with bed mobility.  ?Baseline: Symptomatic  ?Goal status: INITIAL ? ?Patient to demonstrate mild-moderate sway with M-CTSIB condition with eyes closed/foam surface in order to improve safety in environments with uneven surfaces and dim  lighting. ?Baseline: TBD ?Goal status: INITIAL ? ?Patient to score at least 20/24 on DGI in order to decrease risk of falls. ?Baseline: TBD ?Goal status: INITIAL ? ?  ? ?ASSESSMENT: ? ?CLINICAL IMPRESSION: ? ? ?Patient is a 72 y/o F presenting to OPPT with c/o dizziness since 06/15/21 without known cause. Episodes are described as "spinning, nausea" and last seconds. Worse with uick movements such as turning head, looking down, laying down.Denies head trauma, infection/illness, vision changes/double vision, hearing loss, tinnitus, otalgia, photo/phonophobia. Oculomotor exam revealed dizziness with vertical and horizontal VOR, VOR cancellation, and positive L HIT. ? ?Positional testing was positive for dizziness and nystagmus, however with unusual pattern present, thus further testing needed. Patient with definite L anterior cupulolithias which was treated with L Modified Liberatory maneuver. Patient with some dizziness and imbalance at end of session but was able to safely be escorted out of clinic by husband. Would benefit from skilled PT services 1-2x/week for 4 weeks to address aforementioned impairments in order to optimize level of function.   ? ? ?OBJECTIVE IMPAIRMENTS Abnormal gait, decreased balance, and dizziness.  ? ?ACTIVITY LIMITATIONS cleaning, community activity, driving, meal prep, laundry,  yard work, shopping, and church.  ? ?PERSONAL FACTORS Age, Behavior pattern, Past/current experiences, Time since onset of injury/illness/exacerbation, Transportation, and 3+ comorbidities: GERD, HLD, R RTC repair 2019  are also affecting patient's functional outcome.  ? ? ?REHAB POTENTIAL: Good ? ?CLINICAL DECISION MAKING: Evolving/moderate complexity ? ?EVALUATION COMPLEXITY: Moderate ? ? ?PLAN: ?PT FREQUENCY: 1-2x/week ? ?PT DURATION: 4 weeks ? ?PLANNED INTERVENTIONS: Therapeutic exercises, Therapeutic activity, Neuromuscular re-education, Balance training, Gait training, Patient/Family education, Joint  mobilization, Stair training, Vestibular training, Canalith repositioning, DME instructions, Dry Needling, Electrical stimulation, Cryotherapy, Moist heat, Taping, and Manual therapy ? ?PLAN FOR NEXT SESSION: reassess R DH and L

## 2021-06-22 NOTE — Patient Instructions (Signed)
Performed full PFT today. ?

## 2021-06-25 ENCOUNTER — Encounter: Payer: Self-pay | Admitting: Physical Therapy

## 2021-06-25 ENCOUNTER — Ambulatory Visit: Payer: Medicare Other | Attending: Internal Medicine | Admitting: Physical Therapy

## 2021-06-25 DIAGNOSIS — H8113 Benign paroxysmal vertigo, bilateral: Secondary | ICD-10-CM | POA: Insufficient documentation

## 2021-06-25 DIAGNOSIS — R2681 Unsteadiness on feet: Secondary | ICD-10-CM | POA: Insufficient documentation

## 2021-06-25 DIAGNOSIS — R42 Dizziness and giddiness: Secondary | ICD-10-CM | POA: Diagnosis present

## 2021-06-26 NOTE — Therapy (Signed)
?OUTPATIENT PHYSICAL THERAPY VESTIBULAR TREATMENT ? ? ? ? ?Patient Name: Olivia Hodge ?MRN: [id number] ?DOB:August 12, 1950, 71 y.o., female ?Today's Date: 06/27/2021 ? ?PCP: Mackie Pai, PA-C ?REFERRING PROVIDER: Charlane Ferretti, MD ? ? PT End of Session - 06/27/21 1559   ? ? Visit Number 2   ? Number of Visits 9   ? Date for PT Re-Evaluation 07/23/21   ? Authorization Type UHC Medicare   ? PT Start Time 3086   ? PT Stop Time 1540   ? PT Time Calculation (min) 51 min   ? Equipment Utilized During Treatment Gait belt   ? Activity Tolerance Patient tolerated treatment well;Other (comment)   dizziness, nausea  ? Behavior During Therapy [facility] for tasks assessed/performed   ? ?  ?  ? ?  ? ? ? ?Past Medical History:  ?Diagnosis Date  ? Allergic state 08/12/2016  ? Allergy   ? B12 deficiency   ? Blood transfusion without reported diagnosis   ? Cataract   ? removed both eyes   ? Diverticulitis 2015  ? GERD (gastroesophageal reflux disease)   ? occ in the past   ? Hemorrhoids   ? History of chicken pox   ? Hyperlipidemia   ? Melanoma of skin ([place])   ? Thyroid disease   ? Vitamin D deficiency 05/11/2015  ? ?Past Surgical History:  ?Procedure Laterality Date  ? APPENDECTOMY    ? CATARACT EXTRACTION, BILATERAL Bilateral   ? COLONOSCOPY    ? ~ 10 yrs ago - woke up during colon at [facility]   ? EYE SURGERY Bilateral 2018  ? cataracts, Dr Gillian Scarce  ? SHOULDER SURGERY Right 04/10/2017  ? rotator cuff repair  ? ?Patient Active Problem List  ? Diagnosis Date Noted  ? Right shoulder pain 08/20/2017  ? Allergic state 08/12/2016  ? Vitamin D deficiency 05/11/2015  ? History of chicken pox   ? Shingles 12/22/2014  ? Hyperparathyroidism, primary ([place]) 07/01/2014  ? Preventative health care 05/25/2014  ? Cervical cancer screening 05/25/2014  ? B12 deficiency 05/25/2014  ? Diverticulitis of colon 02/21/2014  ? Abdominal pain, left lower quadrant 02/16/2014  ? Numbness 01/04/2014  ? Hypothyroidism 12/21/2013  ? Depression 12/21/2013  ? Obesity (BMI  30-39.9) 12/21/2013  ? Hyperlipidemia 12/21/2013  ? Elevated glucose 12/21/2013  ? Muscle fatigue 12/21/2013  ? Melanoma of skin ([place]) 12/21/2013  ? ? ?ONSET DATE: 06/15/21 ? ?REFERRING DIAG: Benign paroxysmal vertigo, unspecified ear ? ?THERAPY DIAG:  ?BPPV (benign paroxysmal positional vertigo), bilateral ? ?Dizziness and giddiness ? ?Unsteadiness on feet ? ?SUBJECTIVE:  ? ?SUBJECTIVE STATEMENT: ?Reports that she is much better. Continues to feel better every morning. Did not need to take Meclizine and feels pretty good. Reports some remaining dizziness when standing up too quickly. ?Pt accompanied by: self ? ?PERTINENT HISTORY: GERD, HLD, R RTC repair 2019; reports self-resolving vertigo 2-3 months ago ? ? ?PAIN:  ?Are you having pain? No ? ?PRECAUTIONS: None ? ? ? ?TODAY'S TREATMENT: 06/27/21 ?Activity Comments  ?R DH Upbeating R torsional unfatigable with 1 episode of severe dizziness which resolved in ~2 sec without change in nystagmus; otherwise asymptomatic   ?L DH  Downbeating L torsional unfatiguable nystagmus with c/o dizziness which continued upon sitting up for 10-20 sec ?  ?Modified liberatory maneuver for R cupulolithiasis x2  Remaining L torsional downbeating unfatiguable on 1st trial; 2nd trial without nystagmus   ? ? ? ?M-CTSIB  ?Condition 1: Firm Surface, EO 30 Sec, Mild sway  ?Condition  2: Firm Surface, EC 30 Sec, Mild sway  ?Condition 3: Foam Surface, EO 30 Sec, Mild sway  ?Condition 4: Foam Surface, EC 6 Sec, Severe sway  ? ?Dynamic Gait Index  ? ? ?1. Gait level surface __2___ ?(3) Normal: Walks 20?, no assistive devices, good sped, no evidence for imbalance, normal gait pattern ?(2) Mild Impairment: Walks 20?, uses assistive devices, slower speed, mild gait deviations. ?(1) Moderate Impairment: Walks 20?, slow speed, abnormal gait pattern, evidence for imbalance. ?(0) Severe Impairment: Cannot walk 20? without assistance, severe gait deviations or imbalance. ? ?2. Change in gait speed  __2___ ?(3) Normal: Able to smoothly change walking speed without loss of balance or gait deviation. Shows a  ?significant difference in walking speeds between normal, fast and slow speeds. ?(2) Mild Impairment: Is able to change speed but demonstrates mild gait deviations, or not gait  ?deviations but unable to achieve a significant change in velocity, or uses an assistive device. ?(1) Moderate Impairment: Makes only minor adjustments to walking speed, or accomplishes a change  ?in speed with significant gait deviations, or changes speed but has significant gait deviations, or  ?changes speed but loses balance but is able to recover and continue walking. ?(0) Severe Impairment: Cannot change speeds, or loses balance and has to reach for wall or be caught. ? ?3. Gait with horizontal head turns __2___ ?(3) Normal: Performs head turns smoothly with no change in gait. ?(2) Mild Impairment: Performs head turns smoothly with slight change in gait velocity, i.e., minor  ?disruption to smooth gait path or uses walking aid. ?(1) Moderate Impairment: Performs head turns with moderate change in gait velocity, slows down,  ?staggers but recovers, can continue to walk. ?(0) Severe Impairment: Performs task with severe disruption of gait, i.e., staggers ? outside 15? path, loses balance, stops, reaches for wall. ? ?4. Gait with vertical head turns __2___ ?(3) Normal: Performs head turns smoothly with no change in gait. ?(2) Mild Impairment: Performs head turns smoothly with slight change in gait velocity, i.e., minor  ?disruption to smooth gait path or uses walking aid. ?(1) Moderate Impairment: Performs head turns with moderate change in gait velocity, slows down,  ?staggers but recovers, can continue to walk. ?(0) Severe Impairment: Performs task with severe disruption of gait, i.e., staggers ? outside 15? path, loses balance, stops, reaches for wall. ? ?5. Gait and pivot turn __2___ ?(3) Normal: Pivot turns safely within 3  seconds and stops quickly with no loss of balance. ?(2) Mild Impairment: Pivot turns safely in > 3 seconds and stops with no loss of balance. ?(1) Moderate Impairment: Turns slowly, requires verbal cueing, requires several small steps to catch  ?balance following turn and stop. ?(0) Severe Impairment: Cannot turn safely, requires assistance to turn and stop. ? ?6. Step over obstacle __3___ ?(3) Normal: Is able to step over the box without changing gait speed, no evidence of imbalance. ?(2) Mild Impairment: Is able to step over box, but must slow down and adjust steps to clear box safely. ?(1) Moderate Impairment: Is able to step over box but must stop, then step over. May require verbal  ?cueing. ?(0) Severe Impairment: Cannot perform without assistance. ? ?7. Step around obstacles __3___ ?(3) Normal: Is able to walk around cones safely without changing gait speed; no evidence of  ?imbalance. ?(2) Mild Impairment: Is able to step around both cones, but must slow down and adjust steps to clear  ?cones. ?(1) Moderate Impairment: Is able to clear cones but  must significantly slow, speed to accomplish task,  ?or requires verbal cueing. ?(0) Severe Impairment: Unable to clear cones, walks into one or both cones, or requires physical  ?assistance. ? ?8. Steps __3___ ?(3) Normal: Alternating feet, no rail. ?(2) Mild Impairment: Alternating feet, must use rail. ?(1) Moderate Impairment: Two feet to a stair, must use rail. ?(0) Severe Impairment: Cannot do safely. ? ?TOTAL SCORE: __19___/ 24 ? ?Interpretation:  ?< 19/24 = predictive of falls in the elderly ?> 22/24 = safe ambulators ? ? ? ?PATIENT EDUCATION: ?Education details: HEP ?Person educated: Patient ?Education method: Explanation and Handouts ?Education comprehension: verbalized understanding  ? ? ? ?OBJECTIVE: performed on date of initial evaluation unless specified ? ? ?VESTIBULAR ASSESSMENT ? ? ? OCULOMOTOR EXAM: ?  Ocular Alignment: normal ?  Ocular ROM: No  Limitations ?  Spontaneous Nystagmus: absent ?  Gaze-Induced Nystagmus: absent ?  Smooth Pursuits: intact ?  Saccades: slow c/o feeling off ?  ? ? ? VESTIBULAR - OCULAR REFLEX:  ?  Slow VOR: Comment: very slow horizontal

## 2021-06-27 ENCOUNTER — Encounter: Payer: Self-pay | Admitting: Physical Therapy

## 2021-06-27 ENCOUNTER — Ambulatory Visit: Payer: Medicare Other | Admitting: Physical Therapy

## 2021-06-27 DIAGNOSIS — R42 Dizziness and giddiness: Secondary | ICD-10-CM

## 2021-06-27 DIAGNOSIS — H8113 Benign paroxysmal vertigo, bilateral: Secondary | ICD-10-CM

## 2021-06-27 DIAGNOSIS — R2681 Unsteadiness on feet: Secondary | ICD-10-CM

## 2021-06-29 NOTE — Therapy (Signed)
?OUTPATIENT PHYSICAL THERAPY VESTIBULAR TREATMENT ? ? ? ? ?Patient Name: Olivia Hodge ?MRN: [id number] ?DOB:[birth year], 71 y.o., female ?Today's Date: 07/02/2021 ? ?PCP: Mackie Pai, PA-C ?REFERRING PROVIDER: Charlane Ferretti, MD ? ? PT End of Session - 07/02/21 1317   ? ? Visit Number 3   ? Number of Visits 9   ? Date for PT Re-Evaluation 07/23/21   ? Authorization Type [employer] Medicare   ? PT Start Time 1319   ? PT Stop Time 1400   ? PT Time Calculation (min) 41 min   ? Activity Tolerance Patient tolerated treatment well;Other (comment)   dizziness, nausea  ? Behavior During Therapy [facility] for tasks assessed/performed   ? ?  ?  ? ?  ? ? ? ? ?Past Medical History:  ?Diagnosis Date  ? Allergic state 08/12/2016  ? Allergy   ? B12 deficiency   ? Blood transfusion without reported diagnosis   ? Cataract   ? removed both eyes   ? Diverticulitis 2015  ? GERD (gastroesophageal reflux disease)   ? occ in the past   ? Hemorrhoids   ? History of chicken pox   ? Hyperlipidemia   ? Melanoma of skin ([place])   ? Thyroid disease   ? Vitamin D deficiency 05/11/2015  ? ?Past Surgical History:  ?Procedure Laterality Date  ? APPENDECTOMY    ? CATARACT EXTRACTION, BILATERAL Bilateral   ? COLONOSCOPY    ? ~ 10 yrs ago - woke up during colon at [facility]   ? EYE SURGERY Bilateral 2018  ? cataracts, Dr Gillian Scarce  ? SHOULDER SURGERY Right 04/10/2017  ? rotator cuff repair  ? ?Patient Active Problem List  ? Diagnosis Date Noted  ? Right shoulder pain 08/20/2017  ? Allergic state 08/12/2016  ? Vitamin D deficiency 05/11/2015  ? History of chicken pox   ? Shingles 12/22/2014  ? Hyperparathyroidism, primary ([place]) 07/01/2014  ? Preventative health care 05/25/2014  ? Cervical cancer screening 05/25/2014  ? B12 deficiency 05/25/2014  ? Diverticulitis of colon 02/21/2014  ? Abdominal pain, left lower quadrant 02/16/2014  ? Numbness 01/04/2014  ? Hypothyroidism 12/21/2013  ? Depression 12/21/2013  ? Obesity (BMI 30-39.9) 12/21/2013  ? Hyperlipidemia 12/21/2013  ?  Elevated glucose 12/21/2013  ? Muscle fatigue 12/21/2013  ? Melanoma of skin ([place]) 12/21/2013  ? ? ?ONSET DATE: 06/15/21 ? ?REFERRING DIAG: Benign paroxysmal vertigo, unspecified ear ? ?THERAPY DIAG:  ?BPPV (benign paroxysmal positional vertigo), bilateral ? ?Dizziness and giddiness ? ?Unsteadiness on feet ? ?SUBJECTIVE:  ? ?SUBJECTIVE STATEMENT: ?Had a busy weekend. Took Meclizine on Saturday because she was with her grandchildren. Did her HEP about 1/2 the time. Still sleeping on a wedge, able to sleep shortly without it. Still feels off balance when standing up.  ?Pt accompanied by: self ? ?PERTINENT HISTORY: GERD, HLD, R RTC repair 2019; reports self-resolving vertigo 2-3 months ago ? ? ?PAIN:  ?Are you having pain? No ? ?PRECAUTIONS: None ? ? ? ? ?TODAY'S TREATMENT: 07/02/21 ?Activity Comments  ?Sitting head turns to targets 30" Cues to avoid stopping in midline; 1/10 dizziness  ?Sitting head nods to targets 30" 1-2/10 dizziness  ?Standing head turns to targets 30" 2/10 dizziness; cues to promote head rather than trunk rotation  ?Standing head nods to targets 30" 2/10 dizziness  ?Sitting anterior habituation 3x each holding 10" each Cues to allow symptoms to settle upon sitting up; c/o 2-3/10 dizziness and nausea  ?R DH A couple seconds of R upbeating torsional  followed by unfatigueable downbeating L torsional nystagmus with c/o dizziness and nausea  ?L DH High amplitude L upbeating torsional nystagmus and dizzines for ~25 sec; 2nd trial showed downbeat nystagmus lasting ~20 sec and c/o dizziness  ?L Epley x2 C/o dizziness in position 3 and briefly upon sitting; upon sitting up from 2nd trial patient with very intense c/o vertigo and retropulsion with indiscernable nystagmus, resolved after ~1 minute  ? ? ? ?TREATMENT: 06/27/21 ?Activity Comments  ?R DH Upbeating R torsional unfatigable with 1 episode of severe dizziness which resolved in ~2 sec without change in nystagmus; otherwise asymptomatic  ?L DH   Downbeating L torsional unfatiguable nystagmus with c/o dizziness which continued upon sitting up for 10-20 sec ?  ?Modified liberatory maneuver for R cupulolithiasis x2  Remaining L torsional downbeating unfatiguable on 1st trial; 2nd trial without nystagmus   ? ? ?PATIENT EDUCATION: ?Education details: HEP ?Person educated: Patient ?Education method: Explanation and Handouts ?Education comprehension: verbalized understanding  ? ?Access Code: [email or web address] ?URL: [email or web address] ?Date: 07/02/2021 ?Prepared by: [facility] ? ?Exercises ?- Seated Left Head Turns Vestibular Habituation  - 1 x daily - 5 x weekly - 2-3 sets - 30 sec hold ?- Seated Head Nod Vestibular Habituation  - 1 x daily - 5 x weekly - 2-3 sets - 30 sec hold ?- Seated Nose to Left Knee Vestibular Habituation  - 1 x daily - 5 x weekly - 2 sets - 10 reps ? ?OBJECTIVE: performed on date of initial evaluation unless specified ? ? ?VESTIBULAR ASSESSMENT ? ? ? OCULOMOTOR EXAM: ?  Ocular Alignment: normal ?  Ocular ROM: No Limitations ?  Spontaneous Nystagmus: absent ?  Gaze-Induced Nystagmus: absent ?  Smooth Pursuits: intact ?  Saccades: slow c/o feeling off ?  ? ? ? VESTIBULAR - OCULAR REFLEX:  ?  Slow VOR: Comment: very slow horizontal and vertical and c/o dizziness and neck pain ;  ?  VOR Cancellation: Comment: normal but c/o dizziness ?  Head-Impulse Test: HIT Right: negative but with c/o dizziness ?HIT Left: positive; c/o dizziness ?   ?  ? POSITIONAL TESTING: Right Dix-Hallpike: downbeating, left nystagmus; Duration:unfatiguable ?Left Dix-Hallpike: downbeating, left nystagmus; Duration: ~30 sec; limited by nausea ?Right Roll Test: downbeating R torsional unfatiguable; Duration:   ?Left Roll Test: 15 sec upbeating L torsional followed by unfatigeable downbeating R torsional; Duration:   ? ?Objective from 06/27/21: ?M-CTSIB  ?Condition 1: Firm Surface, EO 30 Sec, Mild sway  ?Condition 2: Firm  Surface, EC 30 Sec, Mild sway  ?Condition 3: Foam Surface, EO 30 Sec, Mild sway  ?Condition 4: Foam Surface, EC 6 Sec, Severe sway  ? ?Dynamic Gait Index  ? ? ?1. Gait level surface __2___ ?(3) Normal: Walks 20?, no assistive devices, good sped, no evidence for imbalance, normal gait pattern ?(2) Mild Impairment: Walks 20?, uses assistive devices, slower speed, mild gait deviations. ?(1) Moderate Impairment: Walks 20?, slow speed, abnormal gait pattern, evidence for imbalance. ?(0) Severe Impairment: Cannot walk 20? without assistance, severe gait deviations or imbalance. ? ?2. Change in gait speed __2___ ?(3) Normal: Able to smoothly change walking speed without loss of balance or gait deviation. Shows a  ?significant difference in walking speeds between normal, fast and slow speeds. ?(2) Mild Impairment: Is able to change speed but demonstrates mild gait deviations, or not gait  ?deviations but unable to achieve a significant change in velocity, or uses an assistive device. ?(1) Moderate Impairment: Makes  only minor adjustments to walking speed, or accomplishes a change  ?in speed with significant gait deviations, or changes speed but has significant gait deviations, or  ?changes speed but loses balance but is able to recover and continue walking. ?(0) Severe Impairment: Cannot change speeds, or loses balance and has to reach for wall or be caught. ? ?3. Gait with horizontal head turns __2___ ?(3) Normal: Performs head turns smoothly with no change in gait. ?(2) Mild Impairment: Performs head turns smoothly with slight change in gait velocity, i.e., minor  ?disruption to smooth gait path or uses walking aid. ?(1) Moderate Impairment: Performs head turns with moderate change in gait velocity, slows down,  ?staggers but recovers, can continue to walk. ?(0) Severe Impairment: Performs task with severe disruption of gait, i.e., staggers ? outside 15? path, loses balance, stops, reaches for wall. ? ?4. Gait with  vertical head turns __2___ ?(3) Normal: Performs head turns smoothly with no change in gait. ?(2) Mild Impairment: Performs head turns smoothly with slight change in gait velocity, i.e., minor  ?disruption to smooth gait

## 2021-07-02 ENCOUNTER — Encounter: Payer: Self-pay | Admitting: Physical Therapy

## 2021-07-02 ENCOUNTER — Ambulatory Visit: Payer: Medicare Other | Admitting: Physical Therapy

## 2021-07-02 DIAGNOSIS — H8113 Benign paroxysmal vertigo, bilateral: Secondary | ICD-10-CM | POA: Diagnosis not present

## 2021-07-02 DIAGNOSIS — R42 Dizziness and giddiness: Secondary | ICD-10-CM

## 2021-07-02 DIAGNOSIS — R2681 Unsteadiness on feet: Secondary | ICD-10-CM

## 2021-07-03 NOTE — Therapy (Deleted)
At 6 inch and 12 inch step: Pt performs **** step taps **** reps each leg with **** support.  Pt performs step ups *** x ***reps with *** support for improved functional strength for improved single limb stance.  Pt provides **** assistance throughout activity ? ?

## 2021-07-04 NOTE — Therapy (Signed)
?OUTPATIENT PHYSICAL THERAPY VESTIBULAR TREATMENT ? ? ? ? ?Patient Name: Olivia Hodge ?MRN: [id number] ?DOB:04/20/1950, 71 y.o., female ?Today's Date: 07/05/2021 ? ?PCP: Mackie Pai, PA-C ?REFERRING PROVIDER: Charlane Ferretti, MD ? ? PT End of Session - 07/05/21 1536   ? ? Visit Number 4   ? Number of Visits 9   ? Date for PT Re-Evaluation 07/23/21   ? Authorization Type [employer] Medicare   ? PT Start Time 1950   ? PT Stop Time 1455   sitting rest breaks in between testing and waiting for a +2 for repositioning  ? PT Time Calculation (min) 56 min   ? Activity Tolerance Patient tolerated treatment well   ? Behavior During Therapy [facility] for tasks assessed/performed   ? ?  ?  ? ?  ? ? ? ? ? ?Past Medical History:  ?Diagnosis Date  ? Allergic state 08/12/2016  ? Allergy   ? B12 deficiency   ? Blood transfusion without reported diagnosis   ? Cataract   ? removed both eyes   ? Diverticulitis 2015  ? GERD (gastroesophageal reflux disease)   ? occ in the past   ? Hemorrhoids   ? History of chicken pox   ? Hyperlipidemia   ? Melanoma of skin ([place])   ? Thyroid disease   ? Vitamin D deficiency 05/11/2015  ? ?Past Surgical History:  ?Procedure Laterality Date  ? APPENDECTOMY    ? CATARACT EXTRACTION, BILATERAL Bilateral   ? COLONOSCOPY    ? ~ 10 yrs ago - woke up during colon at [facility]   ? EYE SURGERY Bilateral 2018  ? cataracts, Dr Gillian Scarce  ? SHOULDER SURGERY Right 04/10/2017  ? rotator cuff repair  ? ?Patient Active Problem List  ? Diagnosis Date Noted  ? Right shoulder pain 08/20/2017  ? Allergic state 08/12/2016  ? Vitamin D deficiency 05/11/2015  ? History of chicken pox   ? Shingles 12/22/2014  ? Hyperparathyroidism, primary ([place]) 07/01/2014  ? Preventative health care 05/25/2014  ? Cervical cancer screening 05/25/2014  ? B12 deficiency 05/25/2014  ? Diverticulitis of colon 02/21/2014  ? Abdominal pain, left lower quadrant 02/16/2014  ? Numbness 01/04/2014  ? Hypothyroidism 12/21/2013  ? Depression 12/21/2013  ? Obesity (BMI  30-39.9) 12/21/2013  ? Hyperlipidemia 12/21/2013  ? Elevated glucose 12/21/2013  ? Muscle fatigue 12/21/2013  ? Melanoma of skin ([place]) 12/21/2013  ? ? ?ONSET DATE: 06/15/21 ? ?REFERRING DIAG: Benign paroxysmal vertigo, unspecified ear ? ?THERAPY DIAG:  ?BPPV (benign paroxysmal positional vertigo), bilateral ? ?Dizziness and giddiness ? ?Unsteadiness on feet ? ?SUBJECTIVE:  ? ?SUBJECTIVE STATEMENT: ?The feeling of dizziness never went away. Having some nausea, dizziness today. Was able to sleep without the wedge in supine but not able to roll without nausea.  ?Pt accompanied by: self ? ?PERTINENT HISTORY: GERD, HLD, R RTC repair 2019; reports self-resolving vertigo 2-3 months ago ? ? ?PAIN:  ?Are you having pain? No ? ?PRECAUTIONS: None ? ? ?TODAY'S TREATMENT: 07/05/21 ?Activity Comments  ?R DH ~2 sec upbeating R torsional nystagmus followed by unfatiguable downbeating L torsional nystagmus and c/o mild dizziness  ?L DH Unfatiguable L downbeating torsional nystagmus and no dizziness  ?R roll test L downbeat torsional nystagmus unfatigueable; no dizziness  ?L roll test Possible upbeating L torsional lasting ~20sec; dizziness on 1st trial, none on 2nd trial  ?Sitting anterior habituation 3x to each side with 10 sec  C/o more dizziness to R  ?Modified Liberatory maneuver for L cupulolithiasis x2  Tolerated well but still with persistent L downbeating torsional nystagmus evident   ?   ?*Upon laying supine, L downbeating torsional nystagmus unfatiguable  ? ? ?PATIENT EDUCATION: ?Education details: edu on today's treatment plan and answered patient's questions on BPPV; spoke with patient on possible ENT/Neurology workup if dizziness remains; spoke to patient about her anxiety surrounding dizziness as she notes that she may need anxiety medication to address this ?Person educated: Patient ?Education method: Explanation ?Education comprehension: verbalized understanding ? ? ?TODAY'S TREATMENT: 07/02/21 ?Activity Comments   ?Sitting head turns to targets 30" Cues to avoid stopping in midline; 1/10 dizziness  ?Sitting head nods to targets 30" 1-2/10 dizziness  ?Standing head turns to targets 30" 2/10 dizziness; cues to promote head rather than trunk rotation  ?Standing head nods to targets 30" 2/10 dizziness  ?Sitting anterior habituation 3x each holding 10" each Cues to allow symptoms to settle upon sitting up; c/o 2-3/10 dizziness and nausea  ?R DH A couple seconds of R upbeating torsional followed by unfatigueable downbeating L torsional nystagmus with c/o dizziness and nausea  ?L DH High amplitude L upbeating torsional nystagmus and dizzines for ~25 sec; 2nd trial showed downbeat nystagmus lasting ~20 sec and c/o dizziness  ?L Epley x2 C/o dizziness in position 3 and briefly upon sitting; upon sitting up from 2nd trial patient with very intense c/o vertigo and retropulsion with indiscernable nystagmus, resolved after ~1 minute  ? ? ? ?TREATMENT: 06/27/21 ?Activity Comments  ?R DH Upbeating R torsional unfatigable with 1 episode of severe dizziness which resolved in ~2 sec without change in nystagmus; otherwise asymptomatic  ?L DH  Downbeating L torsional unfatiguable nystagmus with c/o dizziness which continued upon sitting up for 10-20 sec ?  ?Modified liberatory maneuver for R cupulolithiasis x2  Remaining L torsional downbeating unfatiguable on 1st trial; 2nd trial without nystagmus   ? ? ?PATIENT EDUCATION: ?Education details: HEP ?Person educated: Patient ?Education method: Explanation and Handouts ?Education comprehension: verbalized understanding  ? ?Access Code: [email or web address] ?URL: [email or web address] ?Date: 07/02/2021 ?Prepared by: [facility] ? ?Exercises ?- Seated Left Head Turns Vestibular Habituation  - 1 x daily - 5 x weekly - 2-3 sets - 30 sec hold ?- Seated Head Nod Vestibular Habituation  - 1 x daily - 5 x weekly - 2-3 sets - 30 sec hold ?- Seated Nose to Left Knee  Vestibular Habituation  - 1 x daily - 5 x weekly - 2 sets - 10 reps ? ?OBJECTIVE: performed on date of initial evaluation unless specified ? ? ?VESTIBULAR ASSESSMENT ? ? ? OCULOMOTOR EXAM: ?  Ocular Alignment: normal ?  Ocular ROM: No Limitations ?  Spontaneous Nystagmus: absent ?  Gaze-Induced Nystagmus: absent ?  Smooth Pursuits: intact ?  Saccades: slow c/o feeling off ?  ? ? ? VESTIBULAR - OCULAR REFLEX:  ?  Slow VOR: Comment: very slow horizontal and vertical and c/o dizziness and neck pain ;  ?  VOR Cancellation: Comment: normal but c/o dizziness ?  Head-Impulse Test: HIT Right: negative but with c/o dizziness ?HIT Left: positive; c/o dizziness ?   ?  ? POSITIONAL TESTING: Right Dix-Hallpike: downbeating, left nystagmus; Duration:unfatiguable ?Left Dix-Hallpike: downbeating, left nystagmus; Duration: ~30 sec; limited by nausea ?Right Roll Test: downbeating R torsional unfatiguable; Duration:   ?Left Roll Test: 15 sec upbeating L torsional followed by unfatigeable downbeating R torsional; Duration:   ? ?Objective from 06/27/21: ?M-CTSIB  ?Condition 1: Firm Surface, EO 30 Sec,  Mild sway  ?Condition 2: Firm Surface, EC 30 Sec, Mild sway  ?Condition 3: Foam Surface, EO 30 Sec, Mild sway  ?Condition 4: Foam Surface, EC 6 Sec, Severe sway  ? ?Dynamic Gait Index  ? ? ?1. Gait level surface __2___ ?(3) Normal: Walks 20?, no assistive devices, good sped, no evidence for imbalance, normal gait pattern ?(2) Mild Impairment: Walks 20?, uses assistive devices, slower speed, mild gait deviations. ?(1) Moderate Impairment: Walks 20?, slow speed, abnormal gait pattern, evidence for imbalance. ?(0) Severe Impairment: Cannot walk 20? without assistance, severe gait deviations or imbalance. ? ?2. Change in gait speed __2___ ?(3) Normal: Able to smoothly change walking speed without loss of balance or gait deviation. Shows a  ?significant difference in walking speeds between normal, fast and slow speeds. ?(2) Mild Impairment: Is  able to change speed but demonstrates mild gait deviations, or not gait  ?deviations but unable to achieve a significant change in velocity, or uses an assistive device. ?(1) Moderate Impairment: Makes only minor

## 2021-07-05 ENCOUNTER — Ambulatory Visit: Payer: Medicare Other | Admitting: Physical Therapy

## 2021-07-05 ENCOUNTER — Encounter: Payer: Self-pay | Admitting: Physical Therapy

## 2021-07-05 DIAGNOSIS — H8113 Benign paroxysmal vertigo, bilateral: Secondary | ICD-10-CM | POA: Diagnosis not present

## 2021-07-05 DIAGNOSIS — R42 Dizziness and giddiness: Secondary | ICD-10-CM

## 2021-07-05 DIAGNOSIS — R2681 Unsteadiness on feet: Secondary | ICD-10-CM

## 2021-07-05 NOTE — Therapy (Signed)
?OUTPATIENT PHYSICAL THERAPY VESTIBULAR TREATMENT ? ? ? ? ?Patient Name: Olivia Hodge ?MRN: [id number] ?DOB:Mar 18, 1950, 71 y.o., female ?Today's Date: 07/09/2021 ? ?PCP: Mackie Pai, PA-C ?REFERRING PROVIDER: Charlane Ferretti, MD ? ? PT End of Session - 07/09/21 1401   ? ? Visit Number 5   ? Number of Visits 9   ? Date for PT Re-Evaluation 07/23/21   ? Authorization Type [employer] Medicare   ? PT Start Time 1319   ? PT Stop Time 7782   ? PT Time Calculation (min) 34 min   ? Equipment Utilized During Treatment Gait belt   ? Activity Tolerance Patient tolerated treatment well   ? Behavior During Therapy [employer] for tasks assessed/performed   ? ?  ?  ? ?  ? ? ? ? ? ? ?Past Medical History:  ?Diagnosis Date  ? Allergic state 08/12/2016  ? Allergy   ? B12 deficiency   ? Blood transfusion without reported diagnosis   ? Cataract   ? removed both eyes   ? Diverticulitis 2015  ? GERD (gastroesophageal reflux disease)   ? occ in the past   ? Hemorrhoids   ? History of chicken pox   ? Hyperlipidemia   ? Melanoma of skin ([place])   ? Thyroid disease   ? Vitamin D deficiency 05/11/2015  ? ?Past Surgical History:  ?Procedure Laterality Date  ? APPENDECTOMY    ? CATARACT EXTRACTION, BILATERAL Bilateral   ? COLONOSCOPY    ? ~ 10 yrs ago - woke up during colon at [facility]   ? EYE SURGERY Bilateral 2018  ? cataracts, Dr Gillian Scarce  ? SHOULDER SURGERY Right 04/10/2017  ? rotator cuff repair  ? ?Patient Active Problem List  ? Diagnosis Date Noted  ? Right shoulder pain 08/20/2017  ? Allergic state 08/12/2016  ? Vitamin D deficiency 05/11/2015  ? History of chicken pox   ? Shingles 12/22/2014  ? Hyperparathyroidism, primary ([place]) 07/01/2014  ? Preventative health care 05/25/2014  ? Cervical cancer screening 05/25/2014  ? B12 deficiency 05/25/2014  ? Diverticulitis of colon 02/21/2014  ? Abdominal pain, left lower quadrant 02/16/2014  ? Numbness 01/04/2014  ? Hypothyroidism 12/21/2013  ? Depression 12/21/2013  ? Obesity (BMI 30-39.9) 12/21/2013  ?  Hyperlipidemia 12/21/2013  ? Elevated glucose 12/21/2013  ? Muscle fatigue 12/21/2013  ? Melanoma of skin ([place]) 12/21/2013  ? ? ?ONSET DATE: 06/15/21 ? ?REFERRING DIAG: Benign paroxysmal vertigo, unspecified ear ? ?THERAPY DIAG:  ?BPPV (benign paroxysmal positional vertigo), bilateral ? ?Dizziness and giddiness ? ?Unsteadiness on feet ? ?SUBJECTIVE:  ? ?SUBJECTIVE STATEMENT: ?Feeling pretty good. Feels about 70% better. Still having some dizziness with quick head movements.  ?Pt accompanied by: self ? ?PERTINENT HISTORY: GERD, HLD, R RTC repair 2019; reports self-resolving vertigo 2-3 months ago ? ? ?PAIN:  ?Are you having pain? Yes: NPRS scale: 3/10 ?Pain location: posterior neck ?Pain description: sore ?Aggravating factors: movement ?Relieving factors: essential oils ? ?PRECAUTIONS: None ? ? ? ?TODAY'S TREATMENT: 07/09/21 ?Activity Comments  ?Standing head turns to targets 2x30" No dizziness; reports some double vision  ?Standing head nods to targets 2x30" C/o mild dizziness  ?Sitting anterior canal habituation with EO 4x each C/o 3-4/10 dizziness upon coming up   ?Sitting anterior canal habituation with EC 2x each C/o 4/10 dizziness upon coming up; worse to the L   ?Standing VOR horizontal  Unable to maintain focus even at slow speeds d/t c/o diplopia  ?Standing VOR vertical Unable to maintain focus even  at slow speeds d/t c/o diplopia  ?standing wide EC foam 2x30" Mod-severe sway  ?Romberg on foam 30" Mild sway  ?Romberg EC 30" Mild sway  ? 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 ?Access Code: [email or web address] ?URL: [email or web address] ?Date: 07/09/2021 ?Prepared by: [facility] ? ?Exercises ?- Seated Nose to Left Knee Vestibular Habituation   - 1 x daily - 5 x weekly - 2 sets - 3 reps ?- Standing with Head Rotation  - 1 x daily - 5 x weekly - 2-3 sets - 30 sec hold ?- Standing with Head Nod  - 1 x daily - 5 x weekly - 2-3 sets - 30 sec hold ?- Seated Gaze Stabilization with Head Rotation  - 1 x daily - 5 x weekly - 2 sets - 10 reps ?- Seated Gaze Stabilization with Head Nod  - 1 x daily - 5 x weekly - 2 sets - 10 reps ?- Romberg Stance Eyes Closed on Foam Pad  - 1 x daily - 5 x weekly - 2-3 sets - 30 sec hold ? ? ?TREATMENT: 07/05/21 ?Activity Comments  ?R DH ~2 sec upbeating R torsional nystagmus followed by unfatiguable downbeating L torsional nystagmus and c/o mild dizziness  ?L DH Unfatiguable L downbeating torsional nystagmus and no dizziness  ?R roll test L downbeat torsional nystagmus unfatigueable; no dizziness  ?L roll test Possible upbeating L torsional lasting ~20sec; dizziness on 1st trial, none on 2nd trial  ?Sitting anterior habituation 3x to each side with 10 sec  C/o more dizziness to R  ?Modified Liberatory maneuver for L cupulolithiasis x2 Tolerated well but still with persistent L downbeating torsional nystagmus evident   ?   ?*Upon laying supine, L downbeating torsional nystagmus unfatiguable  ? ? ?PATIENT EDUCATION: ?Education details: edu on today's treatment plan and answered patient's questions on BPPV; spoke with patient on possible ENT/Neurology workup if dizziness remains; spoke to patient about her anxiety surrounding dizziness as she notes that she may need anxiety medication to address this ?Person educated: Patient ?Education method: Explanation ?Education comprehension: verbalized understanding ? ? ?TODAY'S TREATMENT: 07/02/21 ?Activity Comments  ?Sitting head turns to targets 30" Cues to avoid stopping in midline; 1/10 dizziness  ?Sitting head nods to targets 30" 1-2/10 dizziness  ?Standing head turns to targets 30" 2/10 dizziness; cues to promote head rather than trunk rotation  ?Standing head nods to targets 30" 2/10  dizziness  ?Sitting anterior habituation 3x each holding 10" each Cues to allow symptoms to settle upon sitting up; c/o 2-3/10 dizziness and nausea  ?R DH A couple seconds of R upbeating torsional followed by unfatigueable downbeating L torsional nystagmus with c/o dizziness and nausea  ?L DH High amplitude L upbeating torsional nystagmus and dizzines for ~25 sec; 2nd trial showed downbeat nystagmus lasting ~20 sec and c/o dizziness  ?L Epley x2 C/o dizziness in position 3 and briefly upon sitting; upon sitting up from 2nd trial patient with very intense c/o vertigo and retropulsion with indiscernable nystagmus, resolved after ~1 minute  ? ? ?PATIENT EDUCATION: ?Education details: HEP ?Person educated: Patient ?Education method: Explanation and Handouts ?Education comprehension: verbalized understanding  ? ?Access Code: [email or web address] ?URL: [email or web address] ?Date: 07/02/2021 ?Prepared by: [facility] ? ?Exercises ?- Seated Left Head Turns Vestibular Habituation  - 1  x daily - 5 x weekly - 2-3 sets - 30 sec hold ?- Seated Head Nod Vestibular Habituation  - 1 x daily - 5 x weekly - 2-3 sets - 30 sec hold ?- Seated Nose to Left Knee Vestibular Habituation  - 1 x daily - 5 x weekly - 2 sets - 10 reps ? ?OBJECTIVE: performed on date of initial evaluation unless specified ? ? ?VESTIBULAR ASSESSMENT ? ? ? OCULOMOTOR EXAM: ?  Ocular Alignment: normal ?  Ocular ROM: No Limitations ?  Spontaneous Nystagmus: absent ?  Gaze-Induced Nystagmus: absent ?  Smooth Pursuits: intact ?  Saccades: slow c/o feeling off ?  ? ? ? VESTIBULAR - OCULAR REFLEX:  ?  Slow VOR: Comment: very slow horizontal and vertical and c/o dizziness and neck pain ;  ?  VOR Cancellation: Comment: normal but c/o dizziness ?  Head-Impulse Test: HIT Right: negative but with c/o dizziness ?HIT Left: positive; c/o dizziness ?   ?  ? POSITIONAL TESTING: Right Dix-Hallpike: downbeating, left nystagmus;  Duration:unfatiguable ?Left Dix-Hallpike: downbeating, left nystagmus; Duration: ~30 sec; limited by nausea ?Right Roll Test: downbeating R torsional unfatiguable; Duration:   ?Left Roll Test: 15 sec upbeating L torsional follo

## 2021-07-09 ENCOUNTER — Ambulatory Visit: Payer: Medicare Other | Admitting: Physical Therapy

## 2021-07-09 ENCOUNTER — Encounter: Payer: Self-pay | Admitting: Physical Therapy

## 2021-07-09 DIAGNOSIS — H8113 Benign paroxysmal vertigo, bilateral: Secondary | ICD-10-CM | POA: Diagnosis not present

## 2021-07-09 DIAGNOSIS — R2681 Unsteadiness on feet: Secondary | ICD-10-CM

## 2021-07-09 DIAGNOSIS — R42 Dizziness and giddiness: Secondary | ICD-10-CM

## 2021-07-11 NOTE — Therapy (Signed)
OUTPATIENT PHYSICAL THERAPY VESTIBULAR TREATMENT     Patient Name: Olivia Hodge MRN: [id number] DOB:[birth year], 71 y.o., female Today's Date: 07/12/2021  PCP: Mackie Pai, PA-C REFERRING PROVIDER: Charlane Ferretti, MD   PT End of Session - 07/12/21 1057     Visit Number 6    Number of Visits 9    Date for PT Re-Evaluation 07/23/21    Authorization Type UHC Medicare    PT Start Time 1018    PT Stop Time 1056    PT Time Calculation (min) 38 min    Equipment Utilized During Treatment Gait belt    Activity Tolerance Patient tolerated treatment well    Behavior During Therapy WFL for tasks assessed/performed                  Past Medical History:  Diagnosis Date   Allergic state 08/12/2016   Allergy    B12 deficiency    Blood transfusion without reported diagnosis    Cataract    removed both eyes    Diverticulitis 2015   GERD (gastroesophageal reflux disease)    occ in the past    Hemorrhoids    History of chicken pox    Hyperlipidemia    Melanoma of skin ([place])    Thyroid disease    Vitamin D deficiency 05/11/2015   Past Surgical History:  Procedure Laterality Date   APPENDECTOMY     CATARACT EXTRACTION, BILATERAL Bilateral    COLONOSCOPY     ~ 10 yrs ago - woke up during colon at [place] Bilateral 2018   cataracts, Dr Gillian Scarce   SHOULDER SURGERY Right 04/10/2017   rotator cuff repair   Patient Active Problem List   Diagnosis Date Noted   Right shoulder pain 08/20/2017   Allergic state 08/12/2016   Vitamin D deficiency 05/11/2015   History of chicken pox    Shingles 12/22/2014   Hyperparathyroidism, primary ([place]) 07/01/2014   Preventative health care 05/25/2014   Cervical cancer screening 05/25/2014   B12 deficiency 05/25/2014   Diverticulitis of colon 02/21/2014   Abdominal pain, left lower quadrant 02/16/2014   Numbness 01/04/2014   Hypothyroidism 12/21/2013   Depression 12/21/2013   Obesity (BMI 30-39.9) 12/21/2013    Hyperlipidemia 12/21/2013   Elevated glucose 12/21/2013   Muscle fatigue 12/21/2013   Melanoma of skin ([place]) 12/21/2013    ONSET DATE: 06/15/21  REFERRING DIAG: Benign paroxysmal vertigo, unspecified ear  THERAPY DIAG:  BPPV (benign paroxysmal positional vertigo), bilateral  Dizziness and giddiness  Unsteadiness on feet  SUBJECTIVE:   SUBJECTIVE STATEMENT: Did a lot yesterday including shopping, walking, and riding in the car and did not have significant dizziness, but instead felt more queasiness. Plans to try more household chores for next week. Has been working on her HEP. Has been sleeping without the wedge and able to roll to either side without symptoms. The only time she does notice some dizziness is when turning quickly to her nightstand to her L.  Pt accompanied by: self  PERTINENT HISTORY: GERD, HLD, R RTC repair 2019; reports self-resolving vertigo 2-3 months ago   PAIN:  Are you having pain? No  PRECAUTIONS: None    TODAY'S TREATMENT: 07/12/21 Activity Comments  1/2 turns to targets with and without alt toe tap on cone Increased imbalance to R; c/o sensation of sway   R/L fwd/back stepping head nods/turns with CGA and counter top support 2x5 each LE C/o 3/10 dizziness  stepping forward over  cone, turning back to pick back up; 6x each with CGA and counter support C/o ~4/10 dizziness; slightly more symptomatic to L   Sitting anterior habituation with EC 1x C/o sensation of eyes moving upon sitting up from L; denies spinning  Standing anterior habituation with EO 2x Mild unsteadiness and c/o nausea    PATIENT EDUCATION: Education details: update to HEP- standing anterior habituation Person educated: Patient Education method: Explanation, Demonstration, Tactile cues, Verbal cues, and Handouts Education comprehension: verbalized understanding and returned demonstration  Access Code: [email or web address] URL: [email or web address] Date: 07/12/2021 Prepared  by: [facility]  Exercises - STANDING Nose to Knee Vestibular Habituation  - 1 x daily - 5 x weekly - 2 sets - 3 reps - Standing with Head Rotation  - 1 x daily - 5 x weekly - 2-3 sets - 30 sec hold - Standing with Head Nod  - 1 x daily - 5 x weekly - 2-3 sets - 30 sec hold - Seated Gaze Stabilization with Head Rotation  - 1 x daily - 5 x weekly - 2 sets - 10 reps - Seated Gaze Stabilization with Head Nod  - 1 x daily - 5 x weekly - 2 sets - 10 reps - Romberg Stance Eyes Closed on Foam Pad  - 1 x daily - 5 x weekly - 2-3 sets - 30 sec hold   TREATMENT: 07/09/21 Activity Comments  Standing head turns to targets 2x30" No dizziness; reports some double vision  Standing head nods to targets 2x30" C/o mild dizziness  Sitting anterior canal habituation with EO 4x each C/o 3-4/10 dizziness upon coming up   Sitting anterior canal habituation with EC 2x each C/o 4/10 dizziness upon coming up; worse to the L   Standing VOR horizontal  Unable to maintain focus even at slow speeds d/t c/o diplopia  Standing VOR vertical Unable to maintain focus even at slow speeds d/t c/o diplopia  standing wide EC foam 2x30" Mod-severe sway  Romberg on foam 30" Mild sway  Romberg EC 30" Mild sway   PATIENT EDUCATION: Education details: HEP; 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 Person educated: Patient Education method: Explanation, Demonstration, Tactile cues, Verbal cues, and Handouts Education comprehension: verbalized understanding and returned demonstration  Access Code: [email or web address] URL: [email or web address] Date: 07/09/2021 Prepared by: [facility]  Exercises - Seated Nose to Left Knee Vestibular Habituation  - 1 x daily - 5 x weekly - 2 sets - 3 reps - Standing with Head Rotation  - 1 x daily - 5 x weekly - 2-3 sets - 30 sec hold - Standing with Head Nod  - 1 x daily - 5 x weekly - 2-3 sets - 30 sec hold - Seated Gaze  Stabilization with Head Rotation  - 1 x daily - 5 x weekly - 2 sets - 10 reps - Seated Gaze Stabilization with Head Nod  - 1 x daily - 5 x weekly - 2 sets - 10 reps - Romberg Stance Eyes Closed on Foam Pad  - 1 x daily - 5 x weekly - 2-3 sets - 30 sec hold   TREATMENT: 07/05/21 Activity Comments  R DH ~2 sec upbeating R torsional nystagmus followed by unfatiguable downbeating L torsional nystagmus and c/o mild dizziness  L DH Unfatiguable L downbeating torsional nystagmus and no dizziness  R roll test L downbeat torsional nystagmus unfatigueable; no dizziness  L roll test Possible upbeating L torsional  lasting ~20sec; dizziness on 1st trial, none on 2nd trial  Sitting anterior habituation 3x to each side with 10 sec  C/o more dizziness to R  Modified Liberatory maneuver for L cupulolithiasis x2 Tolerated well but still with persistent L downbeating torsional nystagmus evident      *Upon laying supine, L downbeating torsional nystagmus unfatiguable    PATIENT EDUCATION: Education details: edu on today's treatment plan and answered patient's questions on BPPV; spoke with patient on possible ENT/Neurology workup if dizziness remains; spoke to patient about her anxiety surrounding dizziness as she notes that she may need anxiety medication to address this Person educated: Patient Education method: Explanation Education comprehension: verbalized understanding    OBJECTIVE: performed on date of initial evaluation unless specified   VESTIBULAR ASSESSMENT    OCULOMOTOR EXAM:   Ocular Alignment: normal   Ocular ROM: No Limitations   Spontaneous Nystagmus: absent   Gaze-Induced Nystagmus: absent   Smooth Pursuits: intact   Saccades: slow c/o feeling off      VESTIBULAR - OCULAR REFLEX:    Slow VOR: Comment: very slow horizontal and vertical and c/o dizziness and neck pain ;    VOR Cancellation: Comment: normal but c/o dizziness   Head-Impulse Test: HIT Right: negative but with c/o  dizziness HIT Left: positive; c/o dizziness       POSITIONAL TESTING: Right Dix-Hallpike: downbeating, left nystagmus; Duration:unfatiguable Left Dix-Hallpike: downbeating, left nystagmus; Duration: ~30 sec; limited by nausea Right Roll Test: downbeating R torsional unfatiguable; Duration:   Left Roll Test: 15 sec upbeating L torsional followed by unfatigeable downbeating R torsional; Duration:    Objective from 06/27/21: M-CTSIB  Condition 1: Firm Surface, EO 30 Sec, Mild sway  Condition 2: Firm Surface, EC 30 Sec, Mild sway  Condition 3: Foam Surface, EO 30 Sec, Mild sway  Condition 4: Foam Surface, EC 6 Sec, Severe sway   Dynamic Gait Index    1. Gait level surface __2___ (3) Normal: Walks 20', no assistive devices, good sped, no evidence for imbalance, normal gait pattern (2) Mild Impairment: Walks 20', uses assistive devices, slower speed, mild gait deviations. (1) Moderate Impairment: Walks 20', slow speed, abnormal gait pattern, evidence for imbalance. (0) Severe Impairment: Cannot walk 20' without assistance, severe gait deviations or imbalance.  2. Change in gait speed __2___ (3) Normal: Able to smoothly change walking speed without loss of balance or gait deviation. Shows a  significant difference in walking speeds between normal, fast and slow speeds. (2) Mild Impairment: Is able to change speed but demonstrates mild gait deviations, or not gait  deviations but unable to achieve a significant change in velocity, or uses an assistive device. (1) Moderate Impairment: Makes only minor adjustments to walking speed, or accomplishes a change  in speed with significant gait deviations, or changes speed but has significant gait deviations, or  changes speed but loses balance but is able to recover and continue walking. (0) Severe Impairment: Cannot change speeds, or loses balance and has to reach for wall or be caught.  3. Gait with horizontal head turns __2___ (3) Normal:  Performs head turns smoothly with no change in gait. (2) Mild Impairment: Performs head turns smoothly with slight change in gait velocity, i.e., minor  disruption to smooth gait path or uses walking aid. (1) Moderate Impairment: Performs head turns with moderate change in gait velocity, slows down,  staggers but recovers, can continue to walk. (0) Severe Impairment: Performs task with severe disruption of gait, i.e., staggers  outside 15" path, loses balance, stops, reaches for wall.  4. Gait with vertical head turns __2___ (3) Normal: Performs head turns smoothly with no change in gait. (2) Mild Impairment: Performs head turns smoothly with slight change in gait velocity, i.e., minor  disruption to smooth gait path or uses walking aid. (1) Moderate Impairment: Performs head turns with moderate change in gait velocity, slows down,  staggers but recovers, can continue to walk. (0) Severe Impairment: Performs task with severe disruption of gait, i.e., staggers  outside 15" path, loses balance, stops, reaches for wall.  5. Gait and pivot turn __2___ (3) Normal: Pivot turns safely within 3 seconds and stops quickly with no loss of balance. (2) Mild Impairment: Pivot turns safely in > 3 seconds and stops with no loss of balance. (1) Moderate Impairment: Turns slowly, requires verbal cueing, requires several small steps to catch  balance following turn and stop. (0) Severe Impairment: Cannot turn safely, requires assistance to turn and stop.  6. Step over obstacle __3___ (3) Normal: Is able to step over the box without changing gait speed, no evidence of imbalance. (2) Mild Impairment: Is able to step over box, but must slow down and adjust steps to clear box safely. (1) Moderate Impairment: Is able to step over box but must stop, then step over. May require verbal  cueing. (0) Severe Impairment: Cannot perform without assistance.  7. Step around obstacles __3___ (3) Normal: Is able to  walk around cones safely without changing gait speed; no evidence of  imbalance. (2) Mild Impairment: Is able to step around both cones, but must slow down and adjust steps to clear  cones. (1) Moderate Impairment: Is able to clear cones but must significantly slow, speed to accomplish task,  or requires verbal cueing. (0) Severe Impairment: Unable to clear cones, walks into one or both cones, or requires physical  assistance.  8. Steps __3___ (3) Normal: Alternating feet, no rail. (2) Mild Impairment: Alternating feet, must use rail. (1) Moderate Impairment: Two feet to a stair, must use rail. (0) Severe Impairment: Cannot do safely.  TOTAL SCORE: __19___/ 24  Interpretation:  < 19/24 = predictive of falls in the elderly > 22/24 = safe ambulators    GOALS: Goals reviewed with patient? Yes  SHORT TERM GOALS: Target date: 07/16/2021  Patient to be independent with initial HEP. Baseline: HEP initiated Goal status: MET    LONG TERM GOALS: Target date: 07/23/2021  Patient to be independent with advanced HEP. Baseline: Not yet initiated  Goal status: IN PROGRESS  Patient to report 0/10 dizziness with standing vertical and horizontal VOR for 30 seconds. Baseline: Unable Goal status: IN PROGRESS  Patient will report 0/10 dizziness with bed mobility.  Baseline: Symptomatic  Goal status: IN PROGRESS  Patient to demonstrate mild-moderate sway with M-CTSIB condition with eyes closed/foam surface in order to improve safety in environments with uneven surfaces and dim lighting. Baseline: TBD Goal status: IN PROGRESS  Patient to score at least 20/24 on DGI in order to decrease risk of falls. Baseline: TBD Goal status: IN PROGRESS     ASSESSMENT:  CLINICAL IMPRESSION:   Patient arrived to session with report of tolerating a busy day yesterday with c/o "queasiness" but no dizziness. Reports that she has returned to sleeping without issues, but does notice dizziness when  turning quickly to the L. Worked on standing balance activities incorporating turns and head movements. Patient demonstrated slightly more instability with turns to the R. However, noting more  symptoms with forward bending activities to L. Patient required short rest break in between activities d/t c/o nausea but overall is demonstrating improved tolerance to dynamic activities. Patient reported understanding of HEP and without complaints at end of session.    OBJECTIVE IMPAIRMENTS Abnormal gait, decreased balance, and dizziness.   ACTIVITY LIMITATIONS cleaning, community activity, driving, meal prep, laundry, yard work, shopping, and church.   PERSONAL FACTORS Age, Behavior pattern, Past/current experiences, Time since onset of injury/illness/exacerbation, Transportation, and 3+ comorbidities: GERD, HLD, R RTC repair 2019  are also affecting patient's functional outcome.    REHAB POTENTIAL: Good  CLINICAL DECISION MAKING: Evolving/moderate complexity  EVALUATION COMPLEXITY: Moderate   PLAN: PT FREQUENCY: 1-2x/week  PT DURATION: 4 weeks  PLANNED INTERVENTIONS: Therapeutic exercises, Therapeutic activity, Neuromuscular re-education, Balance training, Gait training, Patient/Family education, Joint mobilization, Stair training, Vestibular training, Canalith repositioning, DME instructions, Dry Needling, Electrical stimulation, Cryotherapy, Moist heat, Taping, and Manual therapy  PLAN FOR NEXT SESSION:  progress VOR, sensory reweighting, and habituation    Janene Harvey, PT, DPT 07/12/21 10:58 AM

## 2021-07-12 ENCOUNTER — Encounter: Payer: Self-pay | Admitting: Physical Therapy

## 2021-07-12 ENCOUNTER — Ambulatory Visit: Payer: Medicare Other | Admitting: Physical Therapy

## 2021-07-12 DIAGNOSIS — R42 Dizziness and giddiness: Secondary | ICD-10-CM

## 2021-07-12 DIAGNOSIS — R2681 Unsteadiness on feet: Secondary | ICD-10-CM

## 2021-07-12 DIAGNOSIS — H8113 Benign paroxysmal vertigo, bilateral: Secondary | ICD-10-CM

## 2021-07-13 NOTE — Therapy (Signed)
OUTPATIENT PHYSICAL THERAPY VESTIBULAR TREATMENT     Patient Name: Olivia Hodge MRN: [id number] DOB:11-18-1950, 71 y.o., female Today's Date: 07/16/2021  PCP: Mackie Pai, PA-C REFERRING PROVIDER: Charlane Ferretti, MD   PT End of Session - 07/16/21 1401     Visit Number 7    Number of Visits 9    Date for PT Re-Evaluation 07/23/21    Authorization Type [employer]    PT Start Time 3419    PT Stop Time 3790    PT Time Calculation (min) 41 min    Equipment Utilized During Treatment Gait belt    Activity Tolerance Patient tolerated treatment well    Behavior During Therapy WFL for tasks assessed/performed                   Past Medical History:  Diagnosis Date   Allergic state 08/12/2016   Allergy    B12 deficiency    Blood transfusion without reported diagnosis    Cataract    removed both eyes    Diverticulitis 2015   GERD (gastroesophageal reflux disease)    occ in the past    Hemorrhoids    History of chicken pox    Hyperlipidemia    Melanoma of skin ([place])    Thyroid disease    Vitamin D deficiency 05/11/2015   Past Surgical History:  Procedure Laterality Date   APPENDECTOMY     CATARACT EXTRACTION, BILATERAL Bilateral    COLONOSCOPY     ~ 10 yrs ago - woke up during colon at [place] Bilateral 2018   cataracts, Dr Gillian Scarce   SHOULDER SURGERY Right 04/10/2017   rotator cuff repair   Patient Active Problem List   Diagnosis Date Noted   Right shoulder pain 08/20/2017   Allergic state 08/12/2016   Vitamin D deficiency 05/11/2015   History of chicken pox    Shingles 12/22/2014   Hyperparathyroidism, primary ([place]) 07/01/2014   Preventative health care 05/25/2014   Cervical cancer screening 05/25/2014   B12 deficiency 05/25/2014   Diverticulitis of colon 02/21/2014   Abdominal pain, left lower quadrant 02/16/2014   Numbness 01/04/2014   Hypothyroidism 12/21/2013   Depression 12/21/2013   Obesity (BMI 30-39.9) 12/21/2013    Hyperlipidemia 12/21/2013   Elevated glucose 12/21/2013   Muscle fatigue 12/21/2013   Melanoma of skin ([place]) 12/21/2013    ONSET DATE: 06/15/21  REFERRING DIAG: Benign paroxysmal vertigo, unspecified ear  THERAPY DIAG:  BPPV (benign paroxysmal positional vertigo), bilateral  Dizziness and giddiness  Unsteadiness on feet  SUBJECTIVE:   SUBJECTIVE STATEMENT: Feeling about the same. Still able to lay supine and roll in bed without symptoms but still noting dizziness with turning behind her to reach to the L.  Pt accompanied by: self  PERTINENT HISTORY: GERD, HLD, R RTC repair 2019; reports self-resolving vertigo 2-3 months ago   PAIN:  Are you having pain? No  PRECAUTIONS: None    TODAY'S TREATMENT: 07/17/21 Activity Comments  Supine positioning L downbeating torsional nystagmus without dizziness lasting ~40sec   R and L Roll Asymptomatic on R; L upbeating torsional nystagmus lasting ~30 sec on L and c/o "queasy"; upon sitting up from L side patient with brief unsteadiness and retropulsion  L brandt daroff 5x L downbeating torsional nystagmus lasting ~40 sec; c/o mild dizziness which improved with each rep  sitting VOR horizontal 2x30" Using thumb close to nose and without glasses on as this did not bring on diplopia or  dizziness  sitting VOR vertical 2x30" Poor gaze stability but no dizziness or diplopia when not wearing glasses  Standing anterior habituation with EC 3x C/o mild dizziness requiring CGA to R side which improved with increased reps  Standing on foam vertial and horizontal VOR with CGA 30" Mild imbalance; no dizziness  gait + head turns/nods 2x100ft With CGA; mild imbalance but no dizziness    PATIENT EDUCATION: Education details: advised patient ok to drive as head turns/nods are no longer smyptomatic; update to HEP; discussed possible referral to Neurology as patient still demonstrates downbeat nystagmus with positional tesitng  Person educated: Patient and  Spouse Education method: Explanation, Demonstration, Tactile cues, Verbal cues, and Handouts Education comprehension: verbalized understanding and returned demonstration    TREATMENT: 07/12/21 Activity Comments  1/2 turns to targets with and without alt toe tap on cone Increased imbalance to R; c/o sensation of sway   R/L fwd/back stepping head nods/turns with CGA and counter top support 2x5 each LE C/o 3/10 dizziness  stepping forward over cone, turning back to pick back up; 6x each with CGA and counter support C/o ~4/10 dizziness; slightly more symptomatic to L   Sitting anterior habituation with EC 1x C/o sensation of eyes moving upon sitting up from L; denies spinning  Standing anterior habituation with EO 2x Mild unsteadiness and c/o nausea    PATIENT EDUCATION: Education details: update to HEP- standing anterior habituation Person educated: Patient Education method: Explanation, Demonstration, Tactile cues, Verbal cues, and Handouts Education comprehension: verbalized understanding and returned demonstration  Access Code: [email or web address] URL: [email or web address] Date: 07/12/2021 Prepared by: [facility]  Exercises - STANDING Nose to Knee Vestibular Habituation  - 1 x daily - 5 x weekly - 2 sets - 3 reps - Standing with Head Rotation  - 1 x daily - 5 x weekly - 2-3 sets - 30 sec hold - Standing with Head Nod  - 1 x daily - 5 x weekly - 2-3 sets - 30 sec hold - Seated Gaze Stabilization with Head Rotation  - 1 x daily - 5 x weekly - 2 sets - 10 reps - Seated Gaze Stabilization with Head Nod  - 1 x daily - 5 x weekly - 2 sets - 10 reps - Romberg Stance Eyes Closed on Foam Pad  - 1 x daily - 5 x weekly - 2-3 sets - 30 sec hold   TREATMENT: 07/09/21 Activity Comments  Standing head turns to targets 2x30" No dizziness; reports some double vision  Standing head nods to targets 2x30" C/o mild dizziness  Sitting anterior canal  habituation with EO 4x each C/o 3-4/10 dizziness upon coming up   Sitting anterior canal habituation with EC 2x each C/o 4/10 dizziness upon coming up; worse to the L   Standing VOR horizontal  Unable to maintain focus even at slow speeds d/t c/o diplopia  Standing VOR vertical Unable to maintain focus even at slow speeds d/t c/o diplopia  standing wide EC foam 2x30" Mod-severe sway  Romberg on foam 30" Mild sway  Romberg EC 30" Mild sway   PATIENT EDUCATION: Education details: HEP; 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 Person educated: Patient Education method: Explanation, Demonstration, Tactile cues, Verbal cues, and Handouts Education comprehension: verbalized understanding and returned demonstration  Access Code: [email or web address] URL: [email or web address] Date: 07/09/2021 Prepared by: [facility]  Exercises - Seated Nose to Left Knee Vestibular Habituation  - 1 x daily -  5 x weekly - 2 sets - 3 reps - Standing with Head Rotation  - 1 x daily - 5 x weekly - 2-3 sets - 30 sec hold - Standing with Head Nod  - 1 x daily - 5 x weekly - 2-3 sets - 30 sec hold - Seated Gaze Stabilization with Head Rotation  - 1 x daily - 5 x weekly - 2 sets - 10 reps - Seated Gaze Stabilization with Head Nod  - 1 x daily - 5 x weekly - 2 sets - 10 reps - Romberg Stance Eyes Closed on Foam Pad  - 1 x daily - 5 x weekly - 2-3 sets - 30 sec hold   TREATMENT: 07/05/21 Activity Comments  R DH ~2 sec upbeating R torsional nystagmus followed by unfatiguable downbeating L torsional nystagmus and c/o mild dizziness  L DH Unfatiguable L downbeating torsional nystagmus and no dizziness  R roll test L downbeat torsional nystagmus unfatigueable; no dizziness  L roll test Possible upbeating L torsional lasting ~20sec; dizziness on 1st trial, none on 2nd trial  Sitting anterior habituation 3x to each side with 10 sec  C/o more dizziness to R  Modified Liberatory maneuver for L  cupulolithiasis x2 Tolerated well but still with persistent L downbeating torsional nystagmus evident      *Upon laying supine, L downbeating torsional nystagmus unfatiguable    PATIENT EDUCATION: Education details: edu on today's treatment plan and answered patient's questions on BPPV; spoke with patient on possible ENT/Neurology workup if dizziness remains; spoke to patient about her anxiety surrounding dizziness as she notes that she may need anxiety medication to address this Person educated: Patient Education method: Explanation Education comprehension: verbalized understanding    OBJECTIVE: performed on date of initial evaluation unless specified   VESTIBULAR ASSESSMENT    OCULOMOTOR EXAM:   Ocular Alignment: normal   Ocular ROM: No Limitations   Spontaneous Nystagmus: absent   Gaze-Induced Nystagmus: absent   Smooth Pursuits: intact   Saccades: slow c/o feeling off      VESTIBULAR - OCULAR REFLEX:    Slow VOR: Comment: very slow horizontal and vertical and c/o dizziness and neck pain ;    VOR Cancellation: Comment: normal but c/o dizziness   Head-Impulse Test: HIT Right: negative but with c/o dizziness HIT Left: positive; c/o dizziness       POSITIONAL TESTING: Right Dix-Hallpike: downbeating, left nystagmus; Duration:unfatiguable Left Dix-Hallpike: downbeating, left nystagmus; Duration: ~30 sec; limited by nausea Right Roll Test: downbeating R torsional unfatiguable; Duration:   Left Roll Test: 15 sec upbeating L torsional followed by unfatigeable downbeating R torsional; Duration:    Objective from 06/27/21: M-CTSIB  Condition 1: Firm Surface, EO 30 Sec, Mild sway  Condition 2: Firm Surface, EC 30 Sec, Mild sway  Condition 3: Foam Surface, EO 30 Sec, Mild sway  Condition 4: Foam Surface, EC 6 Sec, Severe sway   Dynamic Gait Index    1. Gait level surface __2___ (3) Normal: Walks 20', no assistive devices, good sped, no evidence for imbalance, normal gait  pattern (2) Mild Impairment: Walks 20', uses assistive devices, slower speed, mild gait deviations. (1) Moderate Impairment: Walks 20', slow speed, abnormal gait pattern, evidence for imbalance. (0) Severe Impairment: Cannot walk 20' without assistance, severe gait deviations or imbalance.  2. Change in gait speed __2___ (3) Normal: Able to smoothly change walking speed without loss of balance or gait deviation. Shows a  significant difference in walking speeds between normal, fast and  slow speeds. (2) Mild Impairment: Is able to change speed but demonstrates mild gait deviations, or not gait  deviations but unable to achieve a significant change in velocity, or uses an assistive device. (1) Moderate Impairment: Makes only minor adjustments to walking speed, or accomplishes a change  in speed with significant gait deviations, or changes speed but has significant gait deviations, or  changes speed but loses balance but is able to recover and continue walking. (0) Severe Impairment: Cannot change speeds, or loses balance and has to reach for wall or be caught.  3. Gait with horizontal head turns __2___ (3) Normal: Performs head turns smoothly with no change in gait. (2) Mild Impairment: Performs head turns smoothly with slight change in gait velocity, i.e., minor  disruption to smooth gait path or uses walking aid. (1) Moderate Impairment: Performs head turns with moderate change in gait velocity, slows down,  staggers but recovers, can continue to walk. (0) Severe Impairment: Performs task with severe disruption of gait, i.e., staggers  outside 15" path, loses balance, stops, reaches for wall.  4. Gait with vertical head turns __2___ (3) Normal: Performs head turns smoothly with no change in gait. (2) Mild Impairment: Performs head turns smoothly with slight change in gait velocity, i.e., minor  disruption to smooth gait path or uses walking aid. (1) Moderate Impairment: Performs head  turns with moderate change in gait velocity, slows down,  staggers but recovers, can continue to walk. (0) Severe Impairment: Performs task with severe disruption of gait, i.e., staggers  outside 15" path, loses balance, stops, reaches for wall.  5. Gait and pivot turn __2___ (3) Normal: Pivot turns safely within 3 seconds and stops quickly with no loss of balance. (2) Mild Impairment: Pivot turns safely in > 3 seconds and stops with no loss of balance. (1) Moderate Impairment: Turns slowly, requires verbal cueing, requires several small steps to catch  balance following turn and stop. (0) Severe Impairment: Cannot turn safely, requires assistance to turn and stop.  6. Step over obstacle __3___ (3) Normal: Is able to step over the box without changing gait speed, no evidence of imbalance. (2) Mild Impairment: Is able to step over box, but must slow down and adjust steps to clear box safely. (1) Moderate Impairment: Is able to step over box but must stop, then step over. May require verbal  cueing. (0) Severe Impairment: Cannot perform without assistance.  7. Step around obstacles __3___ (3) Normal: Is able to walk around cones safely without changing gait speed; no evidence of  imbalance. (2) Mild Impairment: Is able to step around both cones, but must slow down and adjust steps to clear  cones. (1) Moderate Impairment: Is able to clear cones but must significantly slow, speed to accomplish task,  or requires verbal cueing. (0) Severe Impairment: Unable to clear cones, walks into one or both cones, or requires physical  assistance.  8. Steps __3___ (3) Normal: Alternating feet, no rail. (2) Mild Impairment: Alternating feet, must use rail. (1) Moderate Impairment: Two feet to a stair, must use rail. (0) Severe Impairment: Cannot do safely.  TOTAL SCORE: __19___/ 24  Interpretation:  < 19/24 = predictive of falls in the elderly > 22/24 = safe ambulators    GOALS: Goals  reviewed with patient? Yes  SHORT TERM GOALS: Target date: 07/16/2021  Patient to be independent with initial HEP. Baseline: HEP initiated Goal status: MET    LONG TERM GOALS: Target date: 07/23/2021  Patient to be  independent with advanced HEP. Baseline: Not yet initiated  Goal status: IN PROGRESS  Patient to report 0/10 dizziness with standing vertical and horizontal VOR for 30 seconds. Baseline: Unable Goal status: IN PROGRESS  Patient will report 0/10 dizziness with bed mobility.  Baseline: Symptomatic  Goal status: IN PROGRESS  Patient to demonstrate mild-moderate sway with M-CTSIB condition with eyes closed/foam surface in order to improve safety in environments with uneven surfaces and dim lighting. Baseline: TBD Goal status: IN PROGRESS  Patient to score at least 20/24 on DGI in order to decrease risk of falls. Baseline: TBD Goal status: IN PROGRESS     ASSESSMENT:  CLINICAL IMPRESSION:   Patient arrived to session with report of no apparent changes in dizziness. Simulated bed mobility to assess for tolerance, which revealed some remaining symptoms towards L. Treated with L Nestor Lewandowsky with symptoms of dizziness dissipating with each subsequent rep. VOR without progressive lenses on no longer brought on c/o diplopia but still with limited gaze fixation with vertical head movements. Patient is demonstrating improved exercise tolerance but continues to demonstrate torsional down beating nystagmus with positional testing. May benefit from referral to Neurology as a result.    OBJECTIVE IMPAIRMENTS Abnormal gait, decreased balance, and dizziness.   ACTIVITY LIMITATIONS cleaning, community activity, driving, meal prep, laundry, yard work, shopping, and church.   PERSONAL FACTORS Age, Behavior pattern, Past/current experiences, Time since onset of injury/illness/exacerbation, Transportation, and 3+ comorbidities: GERD, HLD, R RTC repair 2019  are also affecting  patient's functional outcome.    REHAB POTENTIAL: Good  CLINICAL DECISION MAKING: Evolving/moderate complexity  EVALUATION COMPLEXITY: Moderate   PLAN: PT FREQUENCY: 1-2x/week  PT DURATION: 4 weeks  PLANNED INTERVENTIONS: Therapeutic exercises, Therapeutic activity, Neuromuscular re-education, Balance training, Gait training, Patient/Family education, Joint mobilization, Stair training, Vestibular training, Canalith repositioning, DME instructions, Dry Needling, Electrical stimulation, Cryotherapy, Moist heat, Taping, and Manual therapy  PLAN FOR NEXT SESSION:  progress VOR, sensory reweighting, and habituation    Janene Harvey, PT, DPT 07/16/21 2:02 PM

## 2021-07-16 ENCOUNTER — Ambulatory Visit: Payer: Medicare Other | Admitting: Physical Therapy

## 2021-07-16 ENCOUNTER — Encounter: Payer: Self-pay | Admitting: Physical Therapy

## 2021-07-16 ENCOUNTER — Telehealth: Payer: Self-pay | Admitting: Physical Therapy

## 2021-07-16 DIAGNOSIS — R42 Dizziness and giddiness: Secondary | ICD-10-CM

## 2021-07-16 DIAGNOSIS — H8113 Benign paroxysmal vertigo, bilateral: Secondary | ICD-10-CM

## 2021-07-16 DIAGNOSIS — R2681 Unsteadiness on feet: Secondary | ICD-10-CM

## 2021-07-19 ENCOUNTER — Ambulatory Visit: Payer: Medicare Other | Admitting: Physical Therapy

## 2021-07-20 ENCOUNTER — Ambulatory Visit: Payer: Medicare Other | Admitting: Physical Therapy

## 2021-07-20 ENCOUNTER — Encounter: Payer: Self-pay | Admitting: Physical Therapy

## 2021-07-20 DIAGNOSIS — H8113 Benign paroxysmal vertigo, bilateral: Secondary | ICD-10-CM | POA: Diagnosis not present

## 2021-07-20 DIAGNOSIS — R42 Dizziness and giddiness: Secondary | ICD-10-CM

## 2021-07-20 DIAGNOSIS — R2681 Unsteadiness on feet: Secondary | ICD-10-CM

## 2021-07-20 NOTE — Therapy (Signed)
OUTPATIENT PHYSICAL THERAPY VESTIBULAR PROGRESS NOTE     Patient Name: Olivia Hodge MRN: [id number] DOB:04-19-1950, 71 y.o., female Today's Date: 07/20/2021  PCP: Mackie Pai, PA-C REFERRING PROVIDER: Charlane Ferretti, MD   PT End of Session - 07/20/21 1159     Visit Number 8    Number of Visits 12    Date for PT Re-Evaluation 08/17/21    Authorization Type [employer]    PT Start Time 1101    PT Stop Time 1146    PT Time Calculation (min) 45 min    Equipment Utilized During Treatment Gait belt    Activity Tolerance Patient tolerated treatment well    Behavior During Therapy WFL for tasks assessed/performed                    Past Medical History:  Diagnosis Date   Allergic state 08/12/2016   Allergy    B12 deficiency    Blood transfusion without reported diagnosis    Cataract    removed both eyes    Diverticulitis 2015   GERD (gastroesophageal reflux disease)    occ in the past    Hemorrhoids    History of chicken pox    Hyperlipidemia    Melanoma of skin ([place])    Thyroid disease    Vitamin D deficiency 05/11/2015   Past Surgical History:  Procedure Laterality Date   APPENDECTOMY     CATARACT EXTRACTION, BILATERAL Bilateral    COLONOSCOPY     ~ 10 yrs ago - woke up during colon at [facility] Bilateral 2018   cataracts, Dr Gillian Scarce   SHOULDER SURGERY Right 04/10/2017   rotator cuff repair   Patient Active Problem List   Diagnosis Date Noted   Right shoulder pain 08/20/2017   Allergic state 08/12/2016   Vitamin D deficiency 05/11/2015   History of chicken pox    Shingles 12/22/2014   Hyperparathyroidism, primary ([place]) 07/01/2014   Preventative health care 05/25/2014   Cervical cancer screening 05/25/2014   B12 deficiency 05/25/2014   Diverticulitis of colon 02/21/2014   Abdominal pain, left lower quadrant 02/16/2014   Numbness 01/04/2014   Hypothyroidism 12/21/2013   Depression 12/21/2013   Obesity (BMI 30-39.9) 12/21/2013    Hyperlipidemia 12/21/2013   Elevated glucose 12/21/2013   Muscle fatigue 12/21/2013   Melanoma of skin ([place]) 12/21/2013    ONSET DATE: 06/15/21  REFERRING DIAG: Benign paroxysmal vertigo, unspecified ear  THERAPY DIAG:  BPPV (benign paroxysmal positional vertigo), bilateral  Dizziness and giddiness  Unsteadiness on feet  SUBJECTIVE:   SUBJECTIVE STATEMENT: Had a bad day yesterday- she was adjusting the seat of her car and was bent over for about a minute trying to adjust it. Had some wooziness after a car ride, and then standing requiring. Denies hx of motion sickness. Saw Dr. Louis Matte who said to continue working with PT.  Pt accompanied by: self  PERTINENT HISTORY: GERD, HLD, R RTC repair 2019; reports self-resolving vertigo 2-3 months ago   PAIN:  Are you having pain? No  PRECAUTIONS: None    TODAY'S TREATMENT: 07/19/21 Activity Comments  Standing VOR horizontal 30" Limited by diplopia without glasses and with target ~3 feet away; better tolerance using thumb ~6 inches away from nose  Standing VOR vertical 30" Slow speed; no diplopia when using thumb as target ~6 inches away from nose  R sidelying test Downbeating L torsional nystagmus persisting without dizziness until sitting up  L sidelying  test Downbeating L torsional nystagmus persisting without dizziness until sitting up   Demi semont going to R side first x5  Reported improved sx when NOT holding sitting extended position       PATIENT EDUCATION: Education details: discussion on progress with PT and remaining impairments; update/consolidation of HEP Person educated: Patient Education method: Explanation, Demonstration, and Handouts Education comprehension: verbalized understanding and returned demonstration  Access Code: [email or web address] URL: [email or web address] Date: 07/20/2021 Prepared by: [facility]  Exercises - Standing Gaze Stabilization with Head Rotation   - 1 x daily - 5 x weekly - 2 sets - 30 sec hold - Standing Gaze Stabilization with Head Nod  - 1 x daily - 5 x weekly - 2 sets - 30 sec hold - Romberg Stance Eyes Closed on Foam Pad  - 1 x daily - 5 x weekly - 2-3 sets - 30 sec hold - Walking with Head Rotation  - 1 x daily - 5 x weekly - 2 sets - 8 reps - Seated Nose to Left Knee Vestibular Habituation  - 1 x daily - 5 x weekly - 2 sets - 3 reps - Brandt-Daroff Vestibular Exercise  - 1 x daily - 5 x weekly - 2 sets - 5 reps - Seated Cervical Rotation AROM  - 1 x daily - 5 x weekly - 2 sets - 10 reps - Seated Upper Trapezius Stretch  - 1 x daily - 5 x weekly - 2 sets - 10 reps - 30 sec hold - Seated Cervical Extension AROM  - 1 x daily - 5 x weekly - 2 sets - 10 reps  M-CTSIB  Condition 1: Firm Surface, EO - Sec,  -  Sway  Condition 2: Firm Surface, EC - Sec,  -  Sway  Condition 3: Foam Surface, EO - Sec,  -  Sway  Condition 4: Foam Surface, EC 30 Sec, Moderate and Severe Sway     Dynamic Gait Index    1. Gait level surface __3___ (3) Normal: Walks 20', no assistive devices, good sped, no evidence for imbalance, normal gait pattern (2) Mild Impairment: Walks 20', uses assistive devices, slower speed, mild gait deviations. (1) Moderate Impairment: Walks 20', slow speed, abnormal gait pattern, evidence for imbalance. (0) Severe Impairment: Cannot walk 20' without assistance, severe gait deviations or imbalance.  2. Change in gait speed __3___ (3) Normal: Able to smoothly change walking speed without loss of balance or gait deviation. Shows a  significant difference in walking speeds between normal, fast and slow speeds. (2) Mild Impairment: Is able to change speed but demonstrates mild gait deviations, or not gait  deviations but unable to achieve a significant change in velocity, or uses an assistive device. (1) Moderate Impairment: Makes only minor adjustments to walking speed, or accomplishes a change  in speed with significant gait  deviations, or changes speed but has significant gait deviations, or  changes speed but loses balance but is able to recover and continue walking. (0) Severe Impairment: Cannot change speeds, or loses balance and has to reach for wall or be caught.  3. Gait with horizontal head turns __2___ (3) Normal: Performs head turns smoothly with no change in gait. (2) Mild Impairment: Performs head turns smoothly with slight change in gait velocity, i.e., minor  disruption to smooth gait path or uses walking aid. (1) Moderate Impairment: Performs head turns with moderate change in gait velocity, slows down,  staggers but recovers, can  continue to walk. (0) Severe Impairment: Performs task with severe disruption of gait, i.e., staggers  outside 15" path, loses balance, stops, reaches for wall.  4. Gait with vertical head turns __2___ (3) Normal: Performs head turns smoothly with no change in gait. (2) Mild Impairment: Performs head turns smoothly with slight change in gait velocity, i.e., minor  disruption to smooth gait path or uses walking aid. (1) Moderate Impairment: Performs head turns with moderate change in gait velocity, slows down,  staggers but recovers, can continue to walk. (0) Severe Impairment: Performs task with severe disruption of gait, i.e., staggers  outside 15" path, loses balance, stops, reaches for wall.  5. Gait and pivot turn __3___ (3) Normal: Pivot turns safely within 3 seconds and stops quickly with no loss of balance. (2) Mild Impairment: Pivot turns safely in > 3 seconds and stops with no loss of balance. (1) Moderate Impairment: Turns slowly, requires verbal cueing, requires several small steps to catch  balance following turn and stop. (0) Severe Impairment: Cannot turn safely, requires assistance to turn and stop.  6. Step over obstacle __3___ (3) Normal: Is able to step over the box without changing gait speed, no evidence of imbalance. (2) Mild Impairment: Is  able to step over box, but must slow down and adjust steps to clear box safely. (1) Moderate Impairment: Is able to step over box but must stop, then step over. May require verbal  cueing. (0) Severe Impairment: Cannot perform without assistance.  7. Step around obstacles __0___ (3) Normal: Is able to walk around cones safely without changing gait speed; no evidence of  imbalance. (2) Mild Impairment: Is able to step around both cones, but must slow down and adjust steps to clear  cones. (1) Moderate Impairment: Is able to clear cones but must significantly slow, speed to accomplish task,  or requires verbal cueing. (0) Severe Impairment: Unable to clear cones, walks into one or both cones, or requires physical  assistance.  8. Steps __3___ (3) Normal: Alternating feet, no rail. (2) Mild Impairment: Alternating feet, must use rail. (1) Moderate Impairment: Two feet to a stair, must use rail. (0) Severe Impairment: Cannot do safely.  TOTAL SCORE: __19___/ 24  Interpretation:  < 19/24 = predictive of falls in the elderly > 22/24 = safe ambulators   TREATMENT: 07/17/21 Activity Comments  Supine positioning L downbeating torsional nystagmus without dizziness lasting ~40sec   R and L Roll Asymptomatic on R; L upbeating torsional nystagmus lasting ~30 sec on L and c/o "queasy"; upon sitting up from L side patient with brief unsteadiness and retropulsion  L brandt daroff 5x L downbeating torsional nystagmus lasting ~40 sec; c/o mild dizziness which improved with each rep  sitting VOR horizontal 2x30" Using thumb close to nose and without glasses on as this did not bring on diplopia or dizziness  sitting VOR vertical 2x30" Poor gaze stability but no dizziness or diplopia when not wearing glasses  Standing anterior habituation with EC 3x C/o mild dizziness requiring CGA to R side which improved with increased reps  Standing on foam vertial and horizontal VOR with CGA 30" Mild imbalance; no  dizziness  gait + head turns/nods 2x50ft With CGA; mild imbalance but no dizziness    PATIENT EDUCATION: Education details: advised patient ok to drive as head turns/nods are no longer smyptomatic; update to HEP; discussed possible referral to Neurology as patient still demonstrates downbeat nystagmus with positional tesitng  Person educated: Patient and Spouse Education method:  Explanation, Demonstration, Tactile cues, Verbal cues, and Handouts Education comprehension: verbalized understanding and returned demonstration    TREATMENT: 07/12/21 Activity Comments  1/2 turns to targets with and without alt toe tap on cone Increased imbalance to R; c/o sensation of sway   R/L fwd/back stepping head nods/turns with CGA and counter top support 2x5 each LE C/o 3/10 dizziness  stepping forward over cone, turning back to pick back up; 6x each with CGA and counter support C/o ~4/10 dizziness; slightly more symptomatic to L   Sitting anterior habituation with EC 1x C/o sensation of eyes moving upon sitting up from L; denies spinning  Standing anterior habituation with EO 2x Mild unsteadiness and c/o nausea    PATIENT EDUCATION: Education details: update to HEP- standing anterior habituation Person educated: Patient Education method: Explanation, Demonstration, Tactile cues, Verbal cues, and Handouts Education comprehension: verbalized understanding and returned demonstration  Access Code: [email or web address] URL: [email or web address] Date: 07/12/2021 Prepared by: [facility]  Exercises - STANDING Nose to Knee Vestibular Habituation  - 1 x daily - 5 x weekly - 2 sets - 3 reps - Standing with Head Rotation  - 1 x daily - 5 x weekly - 2-3 sets - 30 sec hold - Standing with Head Nod  - 1 x daily - 5 x weekly - 2-3 sets - 30 sec hold - Seated Gaze Stabilization with Head Rotation  - 1 x daily - 5 x weekly - 2 sets - 10 reps - Seated Gaze Stabilization  with Head Nod  - 1 x daily - 5 x weekly - 2 sets - 10 reps - Romberg Stance Eyes Closed on Foam Pad  - 1 x daily - 5 x weekly - 2-3 sets - 30 sec hold   OBJECTIVE: performed on date of initial evaluation unless specified   VESTIBULAR ASSESSMENT    OCULOMOTOR EXAM:   Ocular Alignment: normal   Ocular ROM: No Limitations   Spontaneous Nystagmus: absent   Gaze-Induced Nystagmus: absent   Smooth Pursuits: intact   Saccades: slow c/o feeling off      VESTIBULAR - OCULAR REFLEX:    Slow VOR: Comment: very slow horizontal and vertical and c/o dizziness and neck pain ;    VOR Cancellation: Comment: normal but c/o dizziness   Head-Impulse Test: HIT Right: negative but with c/o dizziness HIT Left: positive; c/o dizziness       POSITIONAL TESTING: Right Dix-Hallpike: downbeating, left nystagmus; Duration:unfatiguable Left Dix-Hallpike: downbeating, left nystagmus; Duration: ~30 sec; limited by nausea Right Roll Test: downbeating R torsional unfatiguable; Duration:   Left Roll Test: 15 sec upbeating L torsional followed by unfatigeable downbeating R torsional; Duration:    Objective from 06/27/21: M-CTSIB  Condition 1: Firm Surface, EO 30 Sec, Mild sway  Condition 2: Firm Surface, EC 30 Sec, Mild sway  Condition 3: Foam Surface, EO 30 Sec, Mild sway  Condition 4: Foam Surface, EC 6 Sec, Severe sway   Dynamic Gait Index    1. Gait level surface __2___ (3) Normal: Walks 20', no assistive devices, good sped, no evidence for imbalance, normal gait pattern (2) Mild Impairment: Walks 20', uses assistive devices, slower speed, mild gait deviations. (1) Moderate Impairment: Walks 20', slow speed, abnormal gait pattern, evidence for imbalance. (0) Severe Impairment: Cannot walk 20' without assistance, severe gait deviations or imbalance.  2. Change in gait speed __2___ (3) Normal: Able to smoothly change walking speed without loss of balance or gait deviation.  Shows a  significant  difference in walking speeds between normal, fast and slow speeds. (2) Mild Impairment: Is able to change speed but demonstrates mild gait deviations, or not gait  deviations but unable to achieve a significant change in velocity, or uses an assistive device. (1) Moderate Impairment: Makes only minor adjustments to walking speed, or accomplishes a change  in speed with significant gait deviations, or changes speed but has significant gait deviations, or  changes speed but loses balance but is able to recover and continue walking. (0) Severe Impairment: Cannot change speeds, or loses balance and has to reach for wall or be caught.  3. Gait with horizontal head turns __2___ (3) Normal: Performs head turns smoothly with no change in gait. (2) Mild Impairment: Performs head turns smoothly with slight change in gait velocity, i.e., minor  disruption to smooth gait path or uses walking aid. (1) Moderate Impairment: Performs head turns with moderate change in gait velocity, slows down,  staggers but recovers, can continue to walk. (0) Severe Impairment: Performs task with severe disruption of gait, i.e., staggers  outside 15" path, loses balance, stops, reaches for wall.  4. Gait with vertical head turns __2___ (3) Normal: Performs head turns smoothly with no change in gait. (2) Mild Impairment: Performs head turns smoothly with slight change in gait velocity, i.e., minor  disruption to smooth gait path or uses walking aid. (1) Moderate Impairment: Performs head turns with moderate change in gait velocity, slows down,  staggers but recovers, can continue to walk. (0) Severe Impairment: Performs task with severe disruption of gait, i.e., staggers  outside 15" path, loses balance, stops, reaches for wall.  5. Gait and pivot turn __2___ (3) Normal: Pivot turns safely within 3 seconds and stops quickly with no loss of balance. (2) Mild Impairment: Pivot turns safely in > 3 seconds and stops with no  loss of balance. (1) Moderate Impairment: Turns slowly, requires verbal cueing, requires several small steps to catch  balance following turn and stop. (0) Severe Impairment: Cannot turn safely, requires assistance to turn and stop.  6. Step over obstacle __3___ (3) Normal: Is able to step over the box without changing gait speed, no evidence of imbalance. (2) Mild Impairment: Is able to step over box, but must slow down and adjust steps to clear box safely. (1) Moderate Impairment: Is able to step over box but must stop, then step over. May require verbal  cueing. (0) Severe Impairment: Cannot perform without assistance.  7. Step around obstacles __3___ (3) Normal: Is able to walk around cones safely without changing gait speed; no evidence of  imbalance. (2) Mild Impairment: Is able to step around both cones, but must slow down and adjust steps to clear  cones. (1) Moderate Impairment: Is able to clear cones but must significantly slow, speed to accomplish task,  or requires verbal cueing. (0) Severe Impairment: Unable to clear cones, walks into one or both cones, or requires physical  assistance.  8. Steps __3___ (3) Normal: Alternating feet, no rail. (2) Mild Impairment: Alternating feet, must use rail. (1) Moderate Impairment: Two feet to a stair, must use rail. (0) Severe Impairment: Cannot do safely.  TOTAL SCORE: __19___/ 24  Interpretation:  < 19/24 = predictive of falls in the elderly > 22/24 = safe ambulators    GOALS: Goals reviewed with patient? Yes  SHORT TERM GOALS: Target date: 07/16/2021  Patient to be independent with initial HEP. Baseline: HEP initiated Goal status: MET  LONG TERM GOALS: Target date: 08/17/2021  Patient to be independent with advanced HEP. Baseline: Not yet initiated  Goal status: IN PROGRESS  Patient to report 0/10 dizziness with standing vertical and horizontal VOR for 30 seconds. Baseline: Unable Goal status: IN PROGRESS;  able to complete but at slow speed  Patient will report 0/10 dizziness with bed mobility.  Baseline: Symptomatic  Goal status: IN PROGRESS; still c/o dizziness upon sitting up from R/L sidelying  Patient to demonstrate mild-moderate sway with M-CTSIB condition with eyes closed/foam surface in order to improve safety in environments with uneven surfaces and dim lighting. Baseline: TBD Goal status: IN PROGRESS; moderate sway  Patient to score at least 20/24 on DGI in order to decrease risk of falls. Baseline: TBD Goal status: IN PROGRESS; 19/24     ASSESSMENT:  CLINICAL IMPRESSION:   Patient arrived to session with report of slight exacerbation in dizziness yesterday. Patient now reports 0/10 dizziness with VOR at slow pace. Reporting mild dizziness with bed mobility, and still with persistent nystagmus present. Balance on foam/EC is revealing moderate-severe sway but able to tolerate full 30 sec today; significantly improved from previous assessment. Patient's DGI score is unchanged, with most difficulty occurring with head turns/nods and walking around obstacles. Updated HEP to address remaining impairments. Ended session with Demi Semont with patient reported feeling "remarkably better" upon leaving.    OBJECTIVE IMPAIRMENTS Abnormal gait, decreased balance, and dizziness.   ACTIVITY LIMITATIONS cleaning, community activity, driving, meal prep, laundry, yard work, shopping, and church.   PERSONAL FACTORS Age, Behavior pattern, Past/current experiences, Time since onset of injury/illness/exacerbation, Transportation, and 3+ comorbidities: GERD, HLD, R RTC repair 2019  are also affecting patient's functional outcome.    REHAB POTENTIAL: Good  CLINICAL DECISION MAKING: Evolving/moderate complexity  EVALUATION COMPLEXITY: Moderate   PLAN: PT FREQUENCY: 1-2x/week  PT DURATION: 4 weeks  PLANNED INTERVENTIONS: Therapeutic exercises, Therapeutic activity, Neuromuscular  re-education, Balance training, Gait training, Patient/Family education, Joint mobilization, Stair training, Vestibular training, Canalith repositioning, DME instructions, Dry Needling, Electrical stimulation, Cryotherapy, Moist heat, Taping, and Manual therapy  PLAN FOR NEXT SESSION:  progress VOR, sensory reweighting, and habituation    Janene Harvey, PT, DPT 07/20/21 12:02 PM

## 2021-07-24 ENCOUNTER — Ambulatory Visit: Payer: Medicare Other | Admitting: Physical Therapy

## 2021-07-25 NOTE — Therapy (Addendum)
OUTPATIENT PHYSICAL THERAPY VESTIBULAR PROGRESS NOTE     Patient Name: Olivia Hodge MRN: [id number] DOB:1950-04-22, 71 y.o., female Today's Date: 07/26/2021  PCP: Mackie Pai, PA-C REFERRING PROVIDER: Charlane Ferretti, MD   Progress Note Reporting Period 06/25/21 to 07/26/21  See note below for Objective Data and Assessment of Progress/Goals.     PT End of Session - 07/26/21 1101     Visit Number 9    Number of Visits 12    Date for PT Re-Evaluation 08/17/21    Authorization Type [employer]    PT Start Time 1013    PT Stop Time 1057    PT Time Calculation (min) 44 min    Activity Tolerance Patient tolerated treatment well    Behavior During Therapy WFL for tasks assessed/performed                     Past Medical History:  Diagnosis Date   Allergic state 08/12/2016   Allergy    B12 deficiency    Blood transfusion without reported diagnosis    Cataract    removed both eyes    Diverticulitis 2015   GERD (gastroesophageal reflux disease)    occ in the past    Hemorrhoids    History of chicken pox    Hyperlipidemia    Melanoma of skin ([place])    Thyroid disease    Vitamin D deficiency 05/11/2015   Past Surgical History:  Procedure Laterality Date   APPENDECTOMY     CATARACT EXTRACTION, BILATERAL Bilateral    COLONOSCOPY     ~ 10 yrs ago - woke up during colon at [place] Bilateral 2018   cataracts, Dr Gillian Scarce   SHOULDER SURGERY Right 04/10/2017   rotator cuff repair   Patient Active Problem List   Diagnosis Date Noted   Right shoulder pain 08/20/2017   Allergic state 08/12/2016   Vitamin D deficiency 05/11/2015   History of chicken pox    Shingles 12/22/2014   Hyperparathyroidism, primary ([place]) 07/01/2014   Preventative health care 05/25/2014   Cervical cancer screening 05/25/2014   B12 deficiency 05/25/2014   Diverticulitis of colon 02/21/2014   Abdominal pain, left lower quadrant 02/16/2014   Numbness 01/04/2014    Hypothyroidism 12/21/2013   Depression 12/21/2013   Obesity (BMI 30-39.9) 12/21/2013   Hyperlipidemia 12/21/2013   Elevated glucose 12/21/2013   Muscle fatigue 12/21/2013   Melanoma of skin ([place]) 12/21/2013    ONSET DATE: 06/15/21  REFERRING DIAG: Benign paroxysmal vertigo, unspecified ear  THERAPY DIAG:  BPPV (benign paroxysmal positional vertigo), bilateral  Dizziness and giddiness  Unsteadiness on feet  SUBJECTIVE:   SUBJECTIVE STATEMENT: Has been doing lots of normal daily things. Has been practicing the same maneuver from last session. Reports that she now notices standing is more symptomatic than walking. Reports that she spoke with Dr. Louis Matte who recommended to wait on neurology referral.  Pt accompanied by: self  PERTINENT HISTORY: GERD, HLD, R RTC repair 2019; reports self-resolving vertigo 2-3 months ago   PAIN:  Are you having pain? No  PRECAUTIONS: None   TODAY'S TREATMENT: 07/26/21 Activity Comments  L sidelying test   Downbeating L torsional nystagmus lasting ~1.5 min without dizziness; c/o mild wooziness upon sitting up   R sidelying test  No nystagmus; c/o mild wooziness upon sitting up  Yacovino without chin tuck  Tolerated head hang well however with reversal of nystagmus upon sitting up lasting ~30 sec  Rahko starting on R side first  Tolerated well   L sidelying test  Still revealed L downbeating torsional nystagmus lasting ~20 sec; no dizziness  L brandt daroff x5 With PT assist   PATIENT EDUCATION: Education details: edu on vestibular labyrinth and BPPV anatomy and rationale for today's tx Person educated: Patient Education method: Explanation, Demonstration, Tactile cues, and Verbal cues Education comprehension: verbalized understanding and returned demonstration   TREATMENT: 07/19/21 Activity Comments  Standing VOR horizontal 30" Limited by diplopia without glasses and with target ~3 feet away; better tolerance using thumb ~6 inches away  from nose  Standing VOR vertical 30" Slow speed; no diplopia when using thumb as target ~6 inches away from nose  R sidelying test Downbeating L torsional nystagmus persisting without dizziness until sitting up  L sidelying test Downbeating L torsional nystagmus persisting without dizziness until sitting up   Demi semont going to R side first x5  Reported improved sx when NOT holding sitting extended position       PATIENT EDUCATION: Education details: discussion on progress with PT and remaining impairments; update/consolidation of HEP Person educated: Patient Education method: Explanation, Demonstration, and Handouts Education comprehension: verbalized understanding and returned demonstration  Access Code: [email or web address] URL: [email or web address] Date: 07/20/2021 Prepared by: [facility]  Exercises - Standing Gaze Stabilization with Head Rotation  - 1 x daily - 5 x weekly - 2 sets - 30 sec hold - Standing Gaze Stabilization with Head Nod  - 1 x daily - 5 x weekly - 2 sets - 30 sec hold - Romberg Stance Eyes Closed on Foam Pad  - 1 x daily - 5 x weekly - 2-3 sets - 30 sec hold - Walking with Head Rotation  - 1 x daily - 5 x weekly - 2 sets - 8 reps - Seated Nose to Left Knee Vestibular Habituation  - 1 x daily - 5 x weekly - 2 sets - 3 reps - Brandt-Daroff Vestibular Exercise  - 1 x daily - 5 x weekly - 2 sets - 5 reps - Seated Cervical Rotation AROM  - 1 x daily - 5 x weekly - 2 sets - 10 reps - Seated Upper Trapezius Stretch  - 1 x daily - 5 x weekly - 2 sets - 10 reps - 30 sec hold - Seated Cervical Extension AROM  - 1 x daily - 5 x weekly - 2 sets - 10 reps  M-CTSIB  Condition 1: Firm Surface, EO - Sec,  -  Sway  Condition 2: Firm Surface, EC - Sec,  -  Sway  Condition 3: Foam Surface, EO - Sec,  -  Sway  Condition 4: Foam Surface, EC 30 Sec, Moderate and Severe Sway     Dynamic Gait Index    1. Gait level surface __3___ (3)  Normal: Walks 20', no assistive devices, good sped, no evidence for imbalance, normal gait pattern (2) Mild Impairment: Walks 20', uses assistive devices, slower speed, mild gait deviations. (1) Moderate Impairment: Walks 20', slow speed, abnormal gait pattern, evidence for imbalance. (0) Severe Impairment: Cannot walk 20' without assistance, severe gait deviations or imbalance.  2. Change in gait speed __3___ (3) Normal: Able to smoothly change walking speed without loss of balance or gait deviation. Shows a  significant difference in walking speeds between normal, fast and slow speeds. (2) Mild Impairment: Is able to change speed but demonstrates mild gait deviations, or not gait  deviations but  unable to achieve a significant change in velocity, or uses an assistive device. (1) Moderate Impairment: Makes only minor adjustments to walking speed, or accomplishes a change  in speed with significant gait deviations, or changes speed but has significant gait deviations, or  changes speed but loses balance but is able to recover and continue walking. (0) Severe Impairment: Cannot change speeds, or loses balance and has to reach for wall or be caught.  3. Gait with horizontal head turns __2___ (3) Normal: Performs head turns smoothly with no change in gait. (2) Mild Impairment: Performs head turns smoothly with slight change in gait velocity, i.e., minor  disruption to smooth gait path or uses walking aid. (1) Moderate Impairment: Performs head turns with moderate change in gait velocity, slows down,  staggers but recovers, can continue to walk. (0) Severe Impairment: Performs task with severe disruption of gait, i.e., staggers  outside 15" path, loses balance, stops, reaches for wall.  4. Gait with vertical head turns __2___ (3) Normal: Performs head turns smoothly with no change in gait. (2) Mild Impairment: Performs head turns smoothly with slight change in gait velocity, i.e., minor   disruption to smooth gait path or uses walking aid. (1) Moderate Impairment: Performs head turns with moderate change in gait velocity, slows down,  staggers but recovers, can continue to walk. (0) Severe Impairment: Performs task with severe disruption of gait, i.e., staggers  outside 15" path, loses balance, stops, reaches for wall.  5. Gait and pivot turn __3___ (3) Normal: Pivot turns safely within 3 seconds and stops quickly with no loss of balance. (2) Mild Impairment: Pivot turns safely in > 3 seconds and stops with no loss of balance. (1) Moderate Impairment: Turns slowly, requires verbal cueing, requires several small steps to catch  balance following turn and stop. (0) Severe Impairment: Cannot turn safely, requires assistance to turn and stop.  6. Step over obstacle __3___ (3) Normal: Is able to step over the box without changing gait speed, no evidence of imbalance. (2) Mild Impairment: Is able to step over box, but must slow down and adjust steps to clear box safely. (1) Moderate Impairment: Is able to step over box but must stop, then step over. May require verbal  cueing. (0) Severe Impairment: Cannot perform without assistance.  7. Step around obstacles __0___ (3) Normal: Is able to walk around cones safely without changing gait speed; no evidence of  imbalance. (2) Mild Impairment: Is able to step around both cones, but must slow down and adjust steps to clear  cones. (1) Moderate Impairment: Is able to clear cones but must significantly slow, speed to accomplish task,  or requires verbal cueing. (0) Severe Impairment: Unable to clear cones, walks into one or both cones, or requires physical  assistance.  8. Steps __3___ (3) Normal: Alternating feet, no rail. (2) Mild Impairment: Alternating feet, must use rail. (1) Moderate Impairment: Two feet to a stair, must use rail. (0) Severe Impairment: Cannot do safely.  TOTAL SCORE: __19___/ 24  Interpretation:  <  19/24 = predictive of falls in the elderly > 22/24 = safe ambulators   TREATMENT: 07/17/21 Activity Comments  Supine positioning L downbeating torsional nystagmus without dizziness lasting ~40sec   R and L Roll Asymptomatic on R; L upbeating torsional nystagmus lasting ~30 sec on L and c/o "queasy"; upon sitting up from L side patient with brief unsteadiness and retropulsion  L brandt daroff 5x L downbeating torsional nystagmus lasting ~40 sec; c/o mild  dizziness which improved with each rep  sitting VOR horizontal 2x30" Using thumb close to nose and without glasses on as this did not bring on diplopia or dizziness  sitting VOR vertical 2x30" Poor gaze stability but no dizziness or diplopia when not wearing glasses  Standing anterior habituation with EC 3x C/o mild dizziness requiring CGA to R side which improved with increased reps  Standing on foam vertial and horizontal VOR with CGA 30" Mild imbalance; no dizziness  gait + head turns/nods 2x59ft With CGA; mild imbalance but no dizziness    PATIENT EDUCATION: Education details: advised patient ok to drive as head turns/nods are no longer smyptomatic; update to HEP; discussed possible referral to Neurology as patient still demonstrates downbeat nystagmus with positional tesitng  Person educated: Patient and Spouse Education method: Explanation, Demonstration, Tactile cues, Verbal cues, and Handouts Education comprehension: verbalized understanding and returned demonstration    TREATMENT: 07/12/21 Activity Comments  1/2 turns to targets with and without alt toe tap on cone Increased imbalance to R; c/o sensation of sway   R/L fwd/back stepping head nods/turns with CGA and counter top support 2x5 each LE C/o 3/10 dizziness  stepping forward over cone, turning back to pick back up; 6x each with CGA and counter support C/o ~4/10 dizziness; slightly more symptomatic to L   Sitting anterior habituation with EC 1x C/o sensation of eyes moving  upon sitting up from L; denies spinning  Standing anterior habituation with EO 2x Mild unsteadiness and c/o nausea    PATIENT EDUCATION: Education details: update to HEP- standing anterior habituation Person educated: Patient Education method: Explanation, Demonstration, Tactile cues, Verbal cues, and Handouts Education comprehension: verbalized understanding and returned demonstration  Access Code: [email or web address] URL: [email or web address] Date: 07/12/2021 Prepared by: [facility]  Exercises - STANDING Nose to Knee Vestibular Habituation  - 1 x daily - 5 x weekly - 2 sets - 3 reps - Standing with Head Rotation  - 1 x daily - 5 x weekly - 2-3 sets - 30 sec hold - Standing with Head Nod  - 1 x daily - 5 x weekly - 2-3 sets - 30 sec hold - Seated Gaze Stabilization with Head Rotation  - 1 x daily - 5 x weekly - 2 sets - 10 reps - Seated Gaze Stabilization with Head Nod  - 1 x daily - 5 x weekly - 2 sets - 10 reps - Romberg Stance Eyes Closed on Foam Pad  - 1 x daily - 5 x weekly - 2-3 sets - 30 sec hold   OBJECTIVE: performed on date of initial evaluation unless specified   VESTIBULAR ASSESSMENT    OCULOMOTOR EXAM:   Ocular Alignment: normal   Ocular ROM: No Limitations   Spontaneous Nystagmus: absent   Gaze-Induced Nystagmus: absent   Smooth Pursuits: intact   Saccades: slow c/o feeling off      VESTIBULAR - OCULAR REFLEX:    Slow VOR: Comment: very slow horizontal and vertical and c/o dizziness and neck pain ;    VOR Cancellation: Comment: normal but c/o dizziness   Head-Impulse Test: HIT Right: negative but with c/o dizziness HIT Left: positive; c/o dizziness       POSITIONAL TESTING: Right Dix-Hallpike: downbeating, left nystagmus; Duration:unfatiguable Left Dix-Hallpike: downbeating, left nystagmus; Duration: ~30 sec; limited by nausea Right Roll Test: downbeating R torsional unfatiguable; Duration:   Left Roll Test: 15  sec upbeating L torsional followed by unfatigeable downbeating R  torsional; Duration:    Objective from 06/27/21: M-CTSIB  Condition 1: Firm Surface, EO 30 Sec, Mild sway  Condition 2: Firm Surface, EC 30 Sec, Mild sway  Condition 3: Foam Surface, EO 30 Sec, Mild sway  Condition 4: Foam Surface, EC 6 Sec, Severe sway   Dynamic Gait Index    1. Gait level surface __2___ (3) Normal: Walks 20', no assistive devices, good sped, no evidence for imbalance, normal gait pattern (2) Mild Impairment: Walks 20', uses assistive devices, slower speed, mild gait deviations. (1) Moderate Impairment: Walks 20', slow speed, abnormal gait pattern, evidence for imbalance. (0) Severe Impairment: Cannot walk 20' without assistance, severe gait deviations or imbalance.  2. Change in gait speed __2___ (3) Normal: Able to smoothly change walking speed without loss of balance or gait deviation. Shows a  significant difference in walking speeds between normal, fast and slow speeds. (2) Mild Impairment: Is able to change speed but demonstrates mild gait deviations, or not gait  deviations but unable to achieve a significant change in velocity, or uses an assistive device. (1) Moderate Impairment: Makes only minor adjustments to walking speed, or accomplishes a change  in speed with significant gait deviations, or changes speed but has significant gait deviations, or  changes speed but loses balance but is able to recover and continue walking. (0) Severe Impairment: Cannot change speeds, or loses balance and has to reach for wall or be caught.  3. Gait with horizontal head turns __2___ (3) Normal: Performs head turns smoothly with no change in gait. (2) Mild Impairment: Performs head turns smoothly with slight change in gait velocity, i.e., minor  disruption to smooth gait path or uses walking aid. (1) Moderate Impairment: Performs head turns with moderate change in gait velocity, slows down,  staggers but  recovers, can continue to walk. (0) Severe Impairment: Performs task with severe disruption of gait, i.e., staggers  outside 15" path, loses balance, stops, reaches for wall.  4. Gait with vertical head turns __2___ (3) Normal: Performs head turns smoothly with no change in gait. (2) Mild Impairment: Performs head turns smoothly with slight change in gait velocity, i.e., minor  disruption to smooth gait path or uses walking aid. (1) Moderate Impairment: Performs head turns with moderate change in gait velocity, slows down,  staggers but recovers, can continue to walk. (0) Severe Impairment: Performs task with severe disruption of gait, i.e., staggers  outside 15" path, loses balance, stops, reaches for wall.  5. Gait and pivot turn __2___ (3) Normal: Pivot turns safely within 3 seconds and stops quickly with no loss of balance. (2) Mild Impairment: Pivot turns safely in > 3 seconds and stops with no loss of balance. (1) Moderate Impairment: Turns slowly, requires verbal cueing, requires several small steps to catch  balance following turn and stop. (0) Severe Impairment: Cannot turn safely, requires assistance to turn and stop.  6. Step over obstacle __3___ (3) Normal: Is able to step over the box without changing gait speed, no evidence of imbalance. (2) Mild Impairment: Is able to step over box, but must slow down and adjust steps to clear box safely. (1) Moderate Impairment: Is able to step over box but must stop, then step over. May require verbal  cueing. (0) Severe Impairment: Cannot perform without assistance.  7. Step around obstacles __3___ (3) Normal: Is able to walk around cones safely without changing gait speed; no evidence of  imbalance. (2) Mild Impairment: Is able to step around both cones,  but must slow down and adjust steps to clear  cones. (1) Moderate Impairment: Is able to clear cones but must significantly slow, speed to accomplish task,  or requires verbal  cueing. (0) Severe Impairment: Unable to clear cones, walks into one or both cones, or requires physical  assistance.  8. Steps __3___ (3) Normal: Alternating feet, no rail. (2) Mild Impairment: Alternating feet, must use rail. (1) Moderate Impairment: Two feet to a stair, must use rail. (0) Severe Impairment: Cannot do safely.  TOTAL SCORE: __19___/ 24  Interpretation:  < 19/24 = predictive of falls in the elderly > 22/24 = safe ambulators    GOALS: Goals reviewed with patient? Yes  SHORT TERM GOALS: Target date: 07/16/2021  Patient to be independent with initial HEP. Baseline: HEP initiated Goal status: MET    LONG TERM GOALS: Target date: 08/17/2021  Patient to be independent with advanced HEP. Baseline: Not yet initiated  Goal status: IN PROGRESS  Patient to report 0/10 dizziness with standing vertical and horizontal VOR for 30 seconds. Baseline: Unable Goal status: IN PROGRESS; able to complete but at slow speed  Patient will report 0/10 dizziness with bed mobility.  Baseline: Symptomatic  Goal status: IN PROGRESS; still c/o dizziness upon sitting up from R/L sidelying  Patient to demonstrate mild-moderate sway with M-CTSIB condition with eyes closed/foam surface in order to improve safety in environments with uneven surfaces and dim lighting. Baseline: TBD Goal status: IN PROGRESS; moderate sway  Patient to score at least 20/24 on DGI in order to decrease risk of falls. Baseline: TBD Goal status: IN PROGRESS; 19/24     ASSESSMENT:  CLINICAL IMPRESSION:   Patient arrived to session with report of feeling fairly well. Still noticing some dizziness with standing stationary vs. walking. Assessed sidelying test which was negative on R today; still with downbeating L torsional nystagmus on L side but today this was able to fatigue, suggesting conversion to canalithiasis. Treated patient with modified Yacovino maneuver which still showed reversal of nystagmus  upon sitting up, then trialed Rahko with better symptoms upon sitting. Continued working on habituation as patient still with nystagmus and mild wooziness upon sitting up from [place]. Patient reported feeling well at end of session.    OBJECTIVE IMPAIRMENTS Abnormal gait, decreased balance, and dizziness.   ACTIVITY LIMITATIONS cleaning, community activity, driving, meal prep, laundry, yard work, shopping, and church.   PERSONAL FACTORS Age, Behavior pattern, Past/current experiences, Time since onset of injury/illness/exacerbation, Transportation, and 3+ comorbidities: GERD, HLD, R RTC repair 2019  are also affecting patient's functional outcome.    REHAB POTENTIAL: Good  CLINICAL DECISION MAKING: Evolving/moderate complexity  EVALUATION COMPLEXITY: Moderate   PLAN: PT FREQUENCY: 1-2x/week  PT DURATION: 4 weeks  PLANNED INTERVENTIONS: Therapeutic exercises, Therapeutic activity, Neuromuscular re-education, Balance training, Gait training, Patient/Family education, Joint mobilization, Stair training, Vestibular training, Canalith repositioning, DME instructions, Dry Needling, Electrical stimulation, Cryotherapy, Moist heat, Taping, and Manual therapy  PLAN FOR NEXT SESSION:  progress VOR, sensory reweighting, and habituation    Janene Harvey, PT, DPT 07/26/21 11:02 AM

## 2021-07-26 ENCOUNTER — Ambulatory Visit: Payer: Medicare Other | Admitting: Physical Therapy

## 2021-07-26 ENCOUNTER — Ambulatory Visit: Payer: Medicare Other | Attending: Internal Medicine | Admitting: Physical Therapy

## 2021-07-26 ENCOUNTER — Encounter: Payer: Self-pay | Admitting: Physical Therapy

## 2021-07-26 DIAGNOSIS — H8113 Benign paroxysmal vertigo, bilateral: Secondary | ICD-10-CM | POA: Diagnosis present

## 2021-07-26 DIAGNOSIS — R42 Dizziness and giddiness: Secondary | ICD-10-CM | POA: Insufficient documentation

## 2021-07-26 DIAGNOSIS — R2681 Unsteadiness on feet: Secondary | ICD-10-CM | POA: Insufficient documentation

## 2021-07-30 ENCOUNTER — Ambulatory Visit: Payer: Medicare Other | Admitting: Physical Therapy

## 2021-08-02 NOTE — Therapy (Signed)
OUTPATIENT PHYSICAL THERAPY VESTIBULAR TREATMENT NOTE     Patient Name: Olivia Hodge MRN: [id number] DOB:1950-07-15, 71 y.o., female Today's Date: 08/03/2021  PCP: Mackie Pai, PA-C REFERRING PROVIDER: Charlane Ferretti, MD      PT End of Session - 08/03/21 1139     Visit Number 10    Number of Visits 12    Date for PT Re-Evaluation 08/17/21    Authorization Type [employer] Medicare    PT Start Time 1055    PT Stop Time 1133    PT Time Calculation (min) 38 min    Equipment Utilized During Treatment Gait belt    Activity Tolerance Patient tolerated treatment well    Behavior During Therapy WFL for tasks assessed/performed                  Past Medical History:  Diagnosis Date   Allergic state 08/12/2016   Allergy    B12 deficiency    Blood transfusion without reported diagnosis    Cataract    removed both eyes    Diverticulitis 2015   GERD (gastroesophageal reflux disease)    occ in the past    Hemorrhoids    History of chicken pox    Hyperlipidemia    Melanoma of skin ([place])    Thyroid disease    Vitamin D deficiency 05/11/2015   Past Surgical History:  Procedure Laterality Date   APPENDECTOMY     CATARACT EXTRACTION, BILATERAL Bilateral    COLONOSCOPY     ~ 10 yrs ago - woke up during colon at [place] Bilateral 2018   cataracts, Dr Gillian Scarce   SHOULDER SURGERY Right 04/10/2017   rotator cuff repair   Patient Active Problem List   Diagnosis Date Noted   Right shoulder pain 08/20/2017   Allergic state 08/12/2016   Vitamin D deficiency 05/11/2015   History of chicken pox    Shingles 12/22/2014   Hyperparathyroidism, primary ([place]) 07/01/2014   Preventative health care 05/25/2014   Cervical cancer screening 05/25/2014   B12 deficiency 05/25/2014   Diverticulitis of colon 02/21/2014   Abdominal pain, left lower quadrant 02/16/2014   Numbness 01/04/2014   Hypothyroidism 12/21/2013   Depression 12/21/2013   Obesity (BMI 30-39.9) 12/21/2013    Hyperlipidemia 12/21/2013   Elevated glucose 12/21/2013   Muscle fatigue 12/21/2013   Melanoma of skin ([facility]) 12/21/2013    ONSET DATE: 06/15/21  REFERRING DIAG: Benign paroxysmal vertigo, unspecified ear  THERAPY DIAG:  BPPV (benign paroxysmal positional vertigo), bilateral  Dizziness and giddiness  Unsteadiness on feet  SUBJECTIVE:   SUBJECTIVE STATEMENT: Rode to the beach 3 hours out and 3 hours back. Feels like she did not have any setbacks. Still noticing some subtle dizziness when coming back up from bending forward.  Pt accompanied by: self  PERTINENT HISTORY: GERD, HLD, R RTC repair 2019; reports self-resolving vertigo 2-3 months ago   PAIN:  Are you having pain? No  PRECAUTIONS: None    TODAY'S TREATMENT: 08/03/21 Activity Comments  L brandt daroff EO 3x L downbeating torsional nystagmus low amplitude and lasting 10-30 sec; fatiguing with increased reps   L brandt daroff EC 2x C/o mild wooziness upon sitting up; no worse with EC  R/L fwd/back stepping + head nods to targets 10x Cues to widen BOS; improved balance after practice; CGA  Ring toss game bending down to floor from R/L sides each time No dizziness; good stability  gait + vor horizontal/vertical C/o wobbliness  and some observes discontinuity of stepping; no dizziness  Cervical rotation SNAG Cues to decrease speed  Cervical extension SNAG Cues to decrease speed; c/o mild dizziness after repeated motions   Tandem walk fwd/back In II bars; improved stability with practice   PATIENT EDUCATION: Education details: HEP update Person educated: Patient Education method: Explanation, Demonstration, Tactile cues, Verbal cues, and Handouts Education comprehension: verbalized understanding and returned demonstration Access Code: [email or web address] URL: [email or web address] Date: 08/03/2021 Prepared by: [facility]  Exercises - Romberg Stance Eyes Closed on Foam  Pad  - 1 x daily - 5 x weekly - 2-3 sets - 30 sec hold - Seated Nose to Left Knee Vestibular Habituation  - 1 x daily - 5 x weekly - 2 sets - 3 reps - Brandt-Daroff Vestibular Exercise  - 1 x daily - 5 x weekly - 2 sets - 5 reps - Gait with Head Nods and Turns on Grass  - 1 x daily - 5 x weekly - 2 sets - 10 reps - Seated Assisted Cervical Rotation with Towel  - 1 x daily - 5 x weekly - 2 sets - 10 reps - 3 sec hold - Mid-Lower Cervical Extension SNAG with Strap  - 1 x daily - 5 x weekly - 2 sets - 10 reps - 3 sec hold - Tandem Walking with Counter Support  - 1 x daily - 5 x weekly - 2 sets - 10 reps - Backward Tandem Walking with Counter Support  - 1 x daily - 5 x weekly - 2 sets - 10 reps    OBJECTIVE: performed on date of initial evaluation unless specified   VESTIBULAR ASSESSMENT    OCULOMOTOR EXAM:   Ocular Alignment: normal   Ocular ROM: No Limitations   Spontaneous Nystagmus: absent   Gaze-Induced Nystagmus: absent   Smooth Pursuits: intact   Saccades: slow c/o feeling off      VESTIBULAR - OCULAR REFLEX:    Slow VOR: Comment: very slow horizontal and vertical and c/o dizziness and neck pain ;    VOR Cancellation: Comment: normal but c/o dizziness   Head-Impulse Test: HIT Right: negative but with c/o dizziness HIT Left: positive; c/o dizziness       POSITIONAL TESTING: Right Dix-Hallpike: downbeating, left nystagmus; Duration:unfatiguable Left Dix-Hallpike: downbeating, left nystagmus; Duration: ~30 sec; limited by nausea Right Roll Test: downbeating R torsional unfatiguable; Duration:   Left Roll Test: 15 sec upbeating L torsional followed by unfatigeable downbeating R torsional; Duration:    Objective from 06/27/21: M-CTSIB  Condition 1: Firm Surface, EO 30 Sec, Mild sway  Condition 2: Firm Surface, EC 30 Sec, Mild sway  Condition 3: Foam Surface, EO 30 Sec, Mild sway  Condition 4: Foam Surface, EC 6 Sec, Severe sway   07/19/21:  M-CTSIB  Condition 1: Firm  Surface, EO - Sec, - Sway  Condition 2: Firm Surface, EC - Sec, - Sway  Condition 3: Foam Surface, EO - Sec, - Sway  Condition 4: Foam Surface, EC 30 Sec, Moderate and Severe Sway   07/19/21 Dynamic Gait Index: TOTAL SCORE: __19___/ 24   GOALS: Goals reviewed with patient? Yes  SHORT TERM GOALS: Target date: 07/16/2021  Patient to be independent with initial HEP. Baseline: HEP initiated Goal status: MET    LONG TERM GOALS: Target date: 08/17/2021  Patient to be independent with advanced HEP. Baseline: Not yet initiated  Goal status: IN PROGRESS  Patient to report 0/10 dizziness with  standing vertical and horizontal VOR for 30 seconds. Baseline: Unable Goal status: IN PROGRESS; able to complete but at slow speed  Patient will report 0/10 dizziness with bed mobility.  Baseline: Symptomatic  Goal status: IN PROGRESS; still c/o dizziness upon sitting up from R/L sidelying  Patient to demonstrate mild-moderate sway with M-CTSIB condition with eyes closed/foam surface in order to improve safety in environments with uneven surfaces and dim lighting. Baseline: TBD Goal status: IN PROGRESS; moderate sway  Patient to score at least 20/24 on DGI in order to decrease risk of falls. Baseline: TBD Goal status: IN PROGRESS; 19/24     ASSESSMENT:  CLINICAL IMPRESSION:   Patient arrived to session with report of going to the beach for vacation without increase in symptoms. Still notes some occasional dizziness when coming up from bending forward. Patient nearly completely asymptomatic with L Nestor Lewandowsky; no worse with EC. Stepping and gait activities with head movements revealed some imbalance, requiring CGA. Able to demonstrate bending activities today without c/o symptoms. Updated EP with exercises that were well-tolerated today. Patient reported understanding and without complaints at end of session. Patient is demonstrating good tolerance for more challenging activities.     OBJECTIVE IMPAIRMENTS Abnormal gait, decreased balance, and dizziness.   ACTIVITY LIMITATIONS cleaning, community activity, driving, meal prep, laundry, yard work, shopping, and church.   PERSONAL FACTORS Age, Behavior pattern, Past/current experiences, Time since onset of injury/illness/exacerbation, Transportation, and 3+ comorbidities: GERD, HLD, R RTC repair 2019  are also affecting patient's functional outcome.    REHAB POTENTIAL: Good  CLINICAL DECISION MAKING: Evolving/moderate complexity  EVALUATION COMPLEXITY: Moderate   PLAN: PT FREQUENCY: 1-2x/week  PT DURATION: 4 weeks  PLANNED INTERVENTIONS: Therapeutic exercises, Therapeutic activity, Neuromuscular re-education, Balance training, Gait training, Patient/Family education, Joint mobilization, Stair training, Vestibular training, Canalith repositioning, DME instructions, Dry Needling, Electrical stimulation, Cryotherapy, Moist heat, Taping, and Manual therapy  PLAN FOR NEXT SESSION:  progress VOR, sensory reweighting, and habituation    Janene Harvey, PT, DPT 08/03/21 11:41 AM  [facility] [address] [place], [place] [address] Phone # [phone] Fax # [phone]

## 2021-08-03 ENCOUNTER — Encounter: Payer: Self-pay | Admitting: Physical Therapy

## 2021-08-03 ENCOUNTER — Ambulatory Visit: Payer: Medicare Other | Admitting: Physical Therapy

## 2021-08-03 DIAGNOSIS — H8113 Benign paroxysmal vertigo, bilateral: Secondary | ICD-10-CM

## 2021-08-03 DIAGNOSIS — R2681 Unsteadiness on feet: Secondary | ICD-10-CM

## 2021-08-03 DIAGNOSIS — R42 Dizziness and giddiness: Secondary | ICD-10-CM

## 2021-08-06 ENCOUNTER — Encounter: Payer: Self-pay | Admitting: Cardiology

## 2021-08-06 ENCOUNTER — Ambulatory Visit: Payer: Medicare Other | Admitting: Cardiology

## 2021-08-06 VITALS — BP 139/77 | HR 86 | Temp 97.3°F | Resp 16 | Ht 69.0 in | Wt 220.0 lb

## 2021-08-06 DIAGNOSIS — E782 Mixed hyperlipidemia: Secondary | ICD-10-CM

## 2021-08-06 DIAGNOSIS — I2510 Atherosclerotic heart disease of native coronary artery without angina pectoris: Secondary | ICD-10-CM | POA: Insufficient documentation

## 2021-08-06 DIAGNOSIS — R0609 Other forms of dyspnea: Secondary | ICD-10-CM | POA: Insufficient documentation

## 2021-08-06 DIAGNOSIS — I700 Atherosclerosis of aorta: Secondary | ICD-10-CM | POA: Insufficient documentation

## 2021-08-06 MED ORDER — ROSUVASTATIN CALCIUM 10 MG PO TABS: 10.0000 mg | ORAL_TABLET | Freq: Every day | ORAL | 1 refills | Status: DC

## 2021-08-06 NOTE — Progress Notes (Signed)
Patient referred by Esperanza Richters, PA-C for aortic atherosclerosis, coronary calcification  Subjective:   Olivia Hodge, female    DOB: [birth year], 71 y.o.   MRN: [id number]   Chief Complaint  Patient presents with   Shortness of Breath     HPI  71 y.o. Caucasian female with hypothyroidism, aortic atherosclerosis, coronary calcification, with exertional dyspnea  Patient is retired, lives with her husband in [place] home.  She stays active, walks around a mile a day regularly.  She denies any chest pain.  However, she has noticed dyspnea on climbing up a flight of stairs.  She also notices swelling easily while doing household chores such as vacuuming etc.  She quit smoking in 1990s.  Recent CT scan for lung cancer screening showed aortic atherosclerosis and coronary calcification.  She has been on Zetia for many years.  Lipid panel reviewed with the patient.  She has not been on statin before.  She has been started on aspirin by PCP.  She is tolerating it well without any gastric side effects or melena.  Patient does have history of polyps, and is due to get repeat colonoscopy in 3 years.  Patient uses ibuprofen couple times a week for arthritis.  However, she is trying to substitute this with Tylenol.   Past Medical History:  Diagnosis Date   Allergic state 08/12/2016   Allergy    B12 deficiency    Blood transfusion without reported diagnosis    Cataract    removed both eyes    Diverticulitis 2015   GERD (gastroesophageal reflux disease)    occ in the past    Hemorrhoids    History of chicken pox    Hyperlipidemia    Melanoma of skin ([facility])    Thyroid disease    Vitamin D deficiency 05/11/2015     Past Surgical History:  Procedure Laterality Date   APPENDECTOMY     CATARACT EXTRACTION, BILATERAL Bilateral    COLONOSCOPY     ~ 10 yrs ago - woke up during colon at [facility]    EYE SURGERY Bilateral 2018   cataracts, Dr Ranae Pila   SHOULDER SURGERY Right 04/10/2017    rotator cuff repair     Social History   Tobacco Use  Smoking Status Former   Packs/day: 1.00   Years: 25.00   Total pack years: 25.00   Types: Cigarettes   Quit date: 02/26/1992   Years since quitting: 29.4  Smokeless Tobacco Never    Social History   Substance and Sexual Activity  Alcohol Use Yes   Comment: wine daily      Family History  Adopted: Yes  Problem Relation Age of Onset   COPD Daughter    Birth defects Daughter        recurrent bronchitis   Ulcers Daughter    ADD / ADHD Son    Colon cancer Neg Hx    Esophageal cancer Neg Hx    Pancreatic cancer Neg Hx    Prostate cancer Neg Hx    Rectal cancer Neg Hx    Stomach cancer Neg Hx       Current Outpatient Medications:    aspirin EC (ASPIRIN 81) 81 MG tablet, Take 1 tablet by mouth daily., Disp: , Rfl:    azelastine (ASTELIN) 0.1 % nasal spray, SPRAY TWICE IN EACH NOSTRIL TWICE DAILY, Disp: 30 mL, Rfl: 4   Bepotastine Besilate 1.5 % SOLN, , Disp: , Rfl:    cholecalciferol (VITAMIN D3) 25  MCG (1000 UNIT) tablet, Take 1,000 Units by mouth every other day., Disp: , Rfl:    Cyanocobalamin (VITAMIN B 12 PO), Take by mouth., Disp: , Rfl:    ezetimibe (ZETIA) 10 MG tablet, TAKE 1 TABLET DAILY, Disp: 90 tablet, Rfl: 3   fluticasone (FLONASE) 50 MCG/ACT nasal spray, Place 2 sprays into both nostrils daily., Disp: 16 g, Rfl: 1   levothyroxine (SYNTHROID) 137 MCG tablet, Take 137 mcg by mouth every morning., Disp: , Rfl:    montelukast (SINGULAIR) 10 MG tablet, TAKE 1 TABLET AT BEDTIME, Disp: 90 tablet, Rfl: 3   NON FORMULARY, Doterra supplements, Disp: , Rfl:    Probiotic Product (PROBIOTIC DAILY PO), Take 1 tablet by mouth daily., Disp: , Rfl:    sertraline (ZOLOFT) 100 MG tablet, TAKE 1 TABLET DAILY, Disp: 90 tablet, Rfl: 3   Cardiovascular and other pertinent studies:  Reviewed external labs and tests, independently interpreted  EKG 08/06/2021: Sinus rhythm 80 bpm  Nonspecific T-abnormality  CT chest  07/05/2020: 1. Lung-RADS 2, benign appearance or behavior. Continue annual screening with low-dose chest CT without contrast in 12 months. 2. Coronary artery calcifications noted. 3. Moderate size hiatal hernia.   Aortic Atherosclerosis (ICD10-I70.0) and Emphysema (ICD10-J43.9).    Recent labs: 07/15/2021: Glucose 99, BUN/Cr 16/0.83. EGFR 75. Na/K 140/4.6. Rest of the CMP normal H/H 13/40. MCV 92. Platelets 187 Chol 230, TG 164, HDL 54, LDL 140    Review of Systems  Cardiovascular:  Positive for dyspnea on exertion. Negative for chest pain, leg swelling, palpitations and syncope.         Vitals:   08/06/21 0929  BP: 139/77  Pulse: 86  Resp: 16  Temp: (!) 97.3 F (36.3 C)  SpO2: 94%     Body mass index is 32.49 kg/m. Filed Weights   08/06/21 0929  Weight: 220 lb (99.8 kg)     Objective:   Physical Exam Vitals and nursing note reviewed.  Constitutional:      General: She is not in acute distress. Neck:     Vascular: No JVD.  Cardiovascular:     Rate and Rhythm: Normal rate and regular rhythm.     Pulses:          Dorsalis pedis pulses are 1+ on the right side and 2+ on the left side.       Posterior tibial pulses are 2+ on the right side and 1+ on the left side.     Heart sounds: Normal heart sounds. No murmur heard. Pulmonary:     Effort: Pulmonary effort is normal.     Breath sounds: Normal breath sounds. No wheezing or rales.  Musculoskeletal:     Right lower leg: No edema.     Left lower leg: No edema.           Visit diagnoses:   ICD-10-CM   1. Shortness of breath  R06.02 EKG 12-Lead       Orders Placed This Encounter  Procedures   EKG 12-Lead    Meds ordered this encounter  Medications   rosuvastatin (CRESTOR) 10 MG tablet    Sig: Take 1 tablet (10 mg total) by mouth daily.    Dispense:  90 tablet    Refill:  1     Assessment & Recommendations:   71 y.o. Caucasian female with hypothyroidism, aortic atherosclerosis, coronary  calcification, with exertional dyspnea  Exertional dyspnea: He is setting of aortic atherosclerosis and coronary calcification, consider angina (differential.  Recommend echocardiogram  and exercise nuclear stress test.  Aortic atherosclerosis, coronary calcification, mixed hyperlipidemia: Recommend risk factor modification.  Continue heart healthy diet and exercise. In addition to Zetia, recommend Crestor.  Can start at 10 mg and increase to 20 mg as tolerated. Patient will send me message on MyChart in a month if she is tolerating Crestor 10 mg 2 pills a day.  In that case, I will send prescription for Crestor 20 mg daily to [facility]. Repeat lipid panel in 3 months.   Thank you for referring the patient to [place]. Please feel free to contact with any questions.   Nigel Mormon, MD Pager: [phone] Office: [phone]

## 2021-08-06 NOTE — Telephone Encounter (Signed)
From patient.

## 2021-08-09 NOTE — Therapy (Signed)
OUTPATIENT PHYSICAL THERAPY VESTIBULAR TREATMENT NOTE     Patient Name: Olivia Hodge MRN: [id number] DOB:1951-01-16, 71 y.o., female Today's Date: 08/10/2021  PCP: Mackie Pai, PA-C REFERRING PROVIDER: Charlane Ferretti, MD      PT End of Session - 08/10/21 1157     Visit Number 11    Number of Visits 12    Date for PT Re-Evaluation 08/17/21    Authorization Type UHC Medicare    PT Start Time 1103    PT Stop Time 1143    PT Time Calculation (min) 40 min    Equipment Utilized During Treatment Gait belt    Activity Tolerance Patient tolerated treatment well    Behavior During Therapy WFL for tasks assessed/performed                   Past Medical History:  Diagnosis Date   Allergic state 08/12/2016   Allergy    B12 deficiency    Blood transfusion without reported diagnosis    Cataract    removed both eyes    Diverticulitis 2015   GERD (gastroesophageal reflux disease)    occ in the past    Hemorrhoids    History of chicken pox    Hyperlipidemia    Melanoma of skin ([place])    Thyroid disease    Vitamin D deficiency 05/11/2015   Past Surgical History:  Procedure Laterality Date   APPENDECTOMY     CATARACT EXTRACTION, BILATERAL Bilateral    COLONOSCOPY     ~ 10 yrs ago - woke up during colon at [facility] Bilateral 2018   cataracts, Dr Gillian Scarce   SHOULDER SURGERY Right 04/10/2017   rotator cuff repair   Patient Active Problem List   Diagnosis Date Noted   Aortic atherosclerosis ([place]) 08/06/2021   Exertional dyspnea 08/06/2021   Coronary artery calcification 08/06/2021   Right shoulder pain 08/20/2017   Allergic state 08/12/2016   Vitamin D deficiency 05/11/2015   History of chicken pox    Shingles 12/22/2014   Hyperparathyroidism, primary ([place]) 07/01/2014   Preventative health care 05/25/2014   Cervical cancer screening 05/25/2014   B12 deficiency 05/25/2014   Diverticulitis of colon 02/21/2014   Abdominal pain, left lower quadrant  02/16/2014   Numbness 01/04/2014   Hypothyroidism 12/21/2013   Depression 12/21/2013   Obesity (BMI 30-39.9) 12/21/2013   Hyperlipidemia 12/21/2013   Elevated glucose 12/21/2013   Muscle fatigue 12/21/2013   Melanoma of skin ([place]) 12/21/2013    ONSET DATE: 06/15/21  REFERRING DIAG: Benign paroxysmal vertigo, unspecified ear  THERAPY DIAG:  BPPV (benign paroxysmal positional vertigo), bilateral  Dizziness and giddiness  Unsteadiness on feet  SUBJECTIVE:   SUBJECTIVE STATEMENT: Reports that she is feeling pretty good. Reaching overhead or looking up still brings on feeling of unsteadiness 3/10.  Pt accompanied by: self  PERTINENT HISTORY: GERD, HLD, R RTC repair 2019; reports self-resolving vertigo 2-3 months ago   PAIN:  Are you having pain? No  PRECAUTIONS: None     TODAY'S TREATMENT: 08/10/21 Activity Comments  Simulating bed mobility including R sidelying > supine >L sidelying > R sidelying  Downbeating R torsional nystagmus in R SL 30 sec; possible L upbeating torsional ~15 sec  L DH  10 sec downbeating nystagmus *pt reports this is the worst side; posterior sway upon sitting up  R DH 60 sec upbeating R torsional vs. Downbeating L torsional nystagmus; posterior sway upon sitting up  L brandt daroff 3x  L downbeating torsional nystagmus; little to no symptoms.   R brandt daroff 3x No nystagmus; c/o very mild woozness upon sitting up on last rep  Placing red pball overhead 5x, overhead and to L/R diagonals to the floor 5x C/o mild wooziness on 1st rep; c/o 3-4/10 nausea and lightheadedness worse symptoms to the L  prolonged forward bending activities including moving cones on TM (forward direction) at 0.5 mph No symptoms; tolerated 3x30" well   PATIENT EDUCATION: Education details: HEP update; discussion on remainder of POC with plans for 30 day hold on 08/17/21 Person educated: Patient Education method: Explanation, Demonstration, Tactile cues, Verbal cues, and  Handouts Education comprehension: verbalized understanding and returned demonstration  Access Code: [email or web address] URL: [email or web address] Date: 08/10/2021 Prepared by: [facility]  Exercises - Romberg Stance Eyes Closed on Foam Pad  - 1 x daily - 5 x weekly - 2-3 sets - 30 sec hold - Seated Nose to Left Knee Vestibular Habituation  - 1 x daily - 5 x weekly - 2 sets - 3 reps - Brandt-Daroff Vestibular Exercise  - 1 x daily - 5 x weekly - 2 sets - 5 reps - Gait with Head Nods and Turns on Grass  - 1 x daily - 5 x weekly - 2 sets - 10 reps - Seated Assisted Cervical Rotation with Towel  - 1 x daily - 5 x weekly - 2 sets - 10 reps - 3 sec hold - Mid-Lower Cervical Extension SNAG with Strap  - 1 x daily - 5 x weekly - 2 sets - 10 reps - 3 sec hold - Tandem Walking with Counter Support  - 1 x daily - 5 x weekly - 2 sets - 10 reps - Backward Tandem Walking with Counter Support  - 1 x daily - 5 x weekly - 2 sets - 10 reps - Standing Shoulder Single Arm PNF D2 Flexion with Anchored Resistance  - 1 x daily - 5 x weekly - 2 sets - 5 reps   OBJECTIVE: performed on date of initial evaluation unless specified   VESTIBULAR ASSESSMENT    OCULOMOTOR EXAM:   Ocular Alignment: normal   Ocular ROM: No Limitations   Spontaneous Nystagmus: absent   Gaze-Induced Nystagmus: absent   Smooth Pursuits: intact   Saccades: slow c/o feeling off      VESTIBULAR - OCULAR REFLEX:    Slow VOR: Comment: very slow horizontal and vertical and c/o dizziness and neck pain ;    VOR Cancellation: Comment: normal but c/o dizziness   Head-Impulse Test: HIT Right: negative but with c/o dizziness HIT Left: positive; c/o dizziness       POSITIONAL TESTING: Right Dix-Hallpike: downbeating, left nystagmus; Duration:unfatiguable Left Dix-Hallpike: downbeating, left nystagmus; Duration: ~30 sec; limited by nausea Right Roll Test: downbeating R torsional unfatiguable;  Duration:   Left Roll Test: 15 sec upbeating L torsional followed by unfatigeable downbeating R torsional; Duration:    Objective from 06/27/21: M-CTSIB  Condition 1: Firm Surface, EO 30 Sec, Mild sway  Condition 2: Firm Surface, EC 30 Sec, Mild sway  Condition 3: Foam Surface, EO 30 Sec, Mild sway  Condition 4: Foam Surface, EC 6 Sec, Severe sway   07/19/21:  M-CTSIB  Condition 1: Firm Surface, EO - Sec, - Sway  Condition 2: Firm Surface, EC - Sec, - Sway  Condition 3: Foam Surface, EO - Sec, - Sway  Condition 4: Foam Surface, EC 30 Sec, Moderate and  Severe Sway   07/19/21 Dynamic Gait Index: TOTAL SCORE: __19___/ 24   GOALS: Goals reviewed with patient? Yes  SHORT TERM GOALS: Target date: 07/16/2021  Patient to be independent with initial HEP. Baseline: HEP initiated Goal status: MET    LONG TERM GOALS: Target date: 08/17/2021  Patient to be independent with advanced HEP. Baseline: Not yet initiated  Goal status: IN PROGRESS  Patient to report 0/10 dizziness with standing vertical and horizontal VOR for 30 seconds. Baseline: Unable Goal status: IN PROGRESS; able to complete but at slow speed  Patient will report 0/10 dizziness with bed mobility.  Baseline: Symptomatic  Goal status: IN PROGRESS; still c/o dizziness upon sitting up from R/L sidelying  Patient to demonstrate mild-moderate sway with M-CTSIB condition with eyes closed/foam surface in order to improve safety in environments with uneven surfaces and dim lighting. Baseline: TBD Goal status: IN PROGRESS; moderate sway  Patient to score at least 20/24 on DGI in order to decrease risk of falls. Baseline: TBD Goal status: IN PROGRESS; 19/24     ASSESSMENT:  CLINICAL IMPRESSION:   Patient arrived to session with report of some remaining sense of imbalance when looking overhead. Upon discussion, patient also reveals that she fell out of bed last Saturday and hit the L side of her head. Denies any new or  red flag symptoms since this event. Assessed tolerance fo bed mobility at start of session which revealed nystagmus in R>L sidelying. Proceeded with R/L [facility] which revealed fatiguing downbeating nystagmus on L and down vs. upbeating nystagmus on R and with posterior sway upon sitting up from each side. However, patient mostly asymptomatic with R/L brandt daroff. Plan to treat with habituation d/t patient's hx of poor tolerance of CRM. Updated HEP with exercises that were well-tolerated but brought on some symptoms today. Patient reported understanding and without complaints upon leaving.    OBJECTIVE IMPAIRMENTS Abnormal gait, decreased balance, and dizziness.   ACTIVITY LIMITATIONS cleaning, community activity, driving, meal prep, laundry, yard work, shopping, and church.   PERSONAL FACTORS Age, Behavior pattern, Past/current experiences, Time since onset of injury/illness/exacerbation, Transportation, and 3+ comorbidities: GERD, HLD, R RTC repair 2019  are also affecting patient's functional outcome.    REHAB POTENTIAL: Good  CLINICAL DECISION MAKING: Evolving/moderate complexity  EVALUATION COMPLEXITY: Moderate   PLAN: PT FREQUENCY: 1-2x/week  PT DURATION: 4 weeks  PLANNED INTERVENTIONS: Therapeutic exercises, Therapeutic activity, Neuromuscular re-education, Balance training, Gait training, Patient/Family education, Joint mobilization, Stair training, Vestibular training, Canalith repositioning, DME instructions, Dry Needling, Electrical stimulation, Cryotherapy, Moist heat, Taping, and Manual therapy  PLAN FOR NEXT SESSION:  progress VOR, sensory reweighting, and habituation    Janene Harvey, PT, DPT 08/10/21 11:58 AM  [facility] at [facility] [address], [place], [place] [address] Phone # [phone] Fax # [phone]

## 2021-08-10 ENCOUNTER — Ambulatory Visit: Payer: Medicare Other | Admitting: Physical Therapy

## 2021-08-10 ENCOUNTER — Encounter: Payer: Self-pay | Admitting: Physical Therapy

## 2021-08-10 DIAGNOSIS — H8113 Benign paroxysmal vertigo, bilateral: Secondary | ICD-10-CM | POA: Diagnosis not present

## 2021-08-10 DIAGNOSIS — R42 Dizziness and giddiness: Secondary | ICD-10-CM

## 2021-08-10 DIAGNOSIS — R2681 Unsteadiness on feet: Secondary | ICD-10-CM

## 2021-08-14 NOTE — Therapy (Addendum)
OUTPATIENT PHYSICAL THERAPY VESTIBULAR TREATMENT NOTE     Patient Name: Olivia Hodge MRN: [id number] DOB:December 22, 1950, 71 y.o., female Today's Date: 08/16/2021  PCP: Mackie Pai, PA-C REFERRING PROVIDER: Charlane Ferretti, MD  Progress Note Reporting Period 07/26/21 to 08/16/21  See note below for Objective Data and Assessment of Progress/Goals.      PT End of Session - 08/16/21 1047     Visit Number 12    Number of Visits 12    Date for PT Re-Evaluation 08/17/21    Authorization Type UHC Medicare    PT Start Time 1016    PT Stop Time 4709    PT Time Calculation (min) 27 min    Activity Tolerance Patient tolerated treatment well    Behavior During Therapy WFL for tasks assessed/performed                    Past Medical History:  Diagnosis Date   Allergic state 08/12/2016   Allergy    B12 deficiency    Blood transfusion without reported diagnosis    Cataract    removed both eyes    Diverticulitis 2015   GERD (gastroesophageal reflux disease)    occ in the past    Hemorrhoids    History of chicken pox    Hyperlipidemia    Melanoma of skin ([place])    Thyroid disease    Vitamin D deficiency 05/11/2015   Past Surgical History:  Procedure Laterality Date   APPENDECTOMY     CATARACT EXTRACTION, BILATERAL Bilateral    COLONOSCOPY     ~ 10 yrs ago - woke up during colon at [place] Bilateral 2018   cataracts, Dr Gillian Scarce   SHOULDER SURGERY Right 04/10/2017   rotator cuff repair   Patient Active Problem List   Diagnosis Date Noted   Aortic atherosclerosis ([place]) 08/06/2021   Exertional dyspnea 08/06/2021   Coronary artery calcification 08/06/2021   Right shoulder pain 08/20/2017   Allergic state 08/12/2016   Vitamin D deficiency 05/11/2015   History of chicken pox    Shingles 12/22/2014   Hyperparathyroidism, primary ([place]) 07/01/2014   Preventative health care 05/25/2014   Cervical cancer screening 05/25/2014   B12 deficiency 05/25/2014    Diverticulitis of colon 02/21/2014   Abdominal pain, left lower quadrant 02/16/2014   Numbness 01/04/2014   Hypothyroidism 12/21/2013   Depression 12/21/2013   Obesity (BMI 30-39.9) 12/21/2013   Hyperlipidemia 12/21/2013   Elevated glucose 12/21/2013   Muscle fatigue 12/21/2013   Melanoma of skin ([place]) 12/21/2013    ONSET DATE: 06/15/21  REFERRING DIAG: Benign paroxysmal vertigo, unspecified ear  THERAPY DIAG:  BPPV (benign paroxysmal positional vertigo), bilateral  Dizziness and giddiness  Unsteadiness on feet  SUBJECTIVE:   SUBJECTIVE STATEMENT: Reports that she feels 80-90% better.  Pt accompanied by: self  PERTINENT HISTORY: GERD, HLD, R RTC repair 2019; reports self-resolving vertigo 2-3 months ago   PAIN:  Are you having pain? No  PRECAUTIONS: None      TODAY'S TREATMENT: 08/16/21 Activity Comments  VOR x30" horizontal and vertical  No dizziness, blurred or double vision; mild sway and slow pace  Simulation of bed mobility including sit>supine>R roll>supine>L roll>sit C/o mild sensation of movement upon sitting up and with L roll; L downbeating nystagmus without symptoms in R sidelying      M-CTSIB  Condition 1: Firm Surface, EO - Sec,  -  Sway  Condition 2: Firm Surface, EC - Sec,  -  Sway  Condition 3: Foam Surface, EO - Sec,  -  Sway  Condition 4: Foam Surface, EC 30 Sec, Moderate and Severe Sway       [facility] PT Assessment - 08/16/21 0001       Standardized Balance Assessment   Standardized Balance Assessment Dynamic Gait Index      Dynamic Gait Index   Level Surface Normal    Change in Gait Speed Normal    Gait with Horizontal Head Turns Normal    Gait with Vertical Head Turns Normal    Gait and Pivot Turn Normal    Step Over Obstacle Normal    Step Around Obstacles Normal    Steps Normal    Total Score 24             PATIENT EDUCATION: Education details: discussed progress towards goals and review/consolidation of HEP Person  educated: Patient Education method: Explanation, Demonstration, Tactile cues, Verbal cues, and Handouts Education comprehension: verbalized understanding  HOME EXERCISE PROGRAM Last updated: 08/16/21 Access Code: [email or web address] URL: [email or web address] Date: 08/16/2021 Prepared by: [facility]  Exercises - Romberg Stance Eyes Closed on Foam Pad  - 1 x daily - 5 x weekly - 2-3 sets - 30 sec hold - Brandt-Daroff Vestibular Exercise  - 1 x daily - 5 x weekly - 2 sets - 5 reps - Seated Assisted Cervical Rotation with Towel  - 1 x daily - 5 x weekly - 2 sets - 10 reps - 3 sec hold - Mid-Lower Cervical Extension SNAG with Strap  - 1 x daily - 5 x weekly - 2 sets - 10 reps - 3 sec hold - Tandem Walking with Counter Support  - 1 x daily - 5 x weekly - 2 sets - 10 reps - Backward Tandem Walking with Counter Support  - 1 x daily - 5 x weekly - 2 sets - 10 reps - Standing Shoulder Single Arm PNF D2 Flexion with Anchored Resistance  - 1 x daily - 5 x weekly - 2 sets - 5 reps    OBJECTIVE: performed on date of initial evaluation unless specified   VESTIBULAR ASSESSMENT    OCULOMOTOR EXAM:   Ocular Alignment: normal   Ocular ROM: No Limitations   Spontaneous Nystagmus: absent   Gaze-Induced Nystagmus: absent   Smooth Pursuits: intact   Saccades: slow c/o feeling off      VESTIBULAR - OCULAR REFLEX:    Slow VOR: Comment: very slow horizontal and vertical and c/o dizziness and neck pain ;    VOR Cancellation: Comment: normal but c/o dizziness   Head-Impulse Test: HIT Right: negative but with c/o dizziness HIT Left: positive; c/o dizziness       POSITIONAL TESTING: Right Dix-Hallpike: downbeating, left nystagmus; Duration:unfatiguable Left Dix-Hallpike: downbeating, left nystagmus; Duration: ~30 sec; limited by nausea Right Roll Test: downbeating R torsional unfatiguable; Duration:   Left Roll Test: 15 sec upbeating L torsional followed by  unfatigeable downbeating R torsional; Duration:    Objective from 06/27/21: M-CTSIB  Condition 1: Firm Surface, EO 30 Sec, Mild sway  Condition 2: Firm Surface, EC 30 Sec, Mild sway  Condition 3: Foam Surface, EO 30 Sec, Mild sway  Condition 4: Foam Surface, EC 6 Sec, Severe sway   07/19/21:  M-CTSIB  Condition 1: Firm Surface, EO - Sec, - Sway  Condition 2: Firm Surface, EC - Sec, - Sway  Condition 3: Foam Surface, EO - Sec, - Sway  Condition 4: Foam Surface, EC 30 Sec, Moderate and Severe Sway   07/19/21 Dynamic Gait Index: TOTAL SCORE: __19___/ 24   GOALS: Goals reviewed with patient? Yes  SHORT TERM GOALS: Target date: 07/16/2021  Patient to be independent with initial HEP. Baseline: HEP initiated Goal status: MET    LONG TERM GOALS: Target date: 08/17/2021  Patient to be independent with advanced HEP. Baseline: Not yet initiated  Goal status: MET 08/16/21  Patient to report 0/10 dizziness with standing vertical and horizontal VOR for 30 seconds. Baseline: Unable Goal status: MET 08/16/21  Patient will report 0/10 dizziness with bed mobility.  Baseline: Symptomatic  Goal status: IN PROGRESS; still c/o dizziness upon sitting up from R/L sidelying 08/16/21  Patient to demonstrate mild-moderate sway with M-CTSIB condition with eyes closed/foam surface in order to improve safety in environments with uneven surfaces and dim lighting. Baseline: TBD Goal status: IN PROGRESS; moderate-severe sway 08/16/21  Patient to score at least 20/24 on DGI in order to decrease risk of falls. Baseline: TBD Goal status: MET; 08/16/21 24/24     ASSESSMENT:  CLINICAL IMPRESSION:  Patient arrived to session with report of 80-90% improvement since initial eval.  Patient is able to perform VOR without dizziness or vision changes. c/o /10 dizziness. Bed mobility today revealed mild sensation of movement remaining with L roll and upon sitting up. Patient performed M-CTSIB testing on  foam with EC with moderate-severe sway. Also scored 24/24 on DGI. Balance testing today indicates a decreased risk of falls. HEP was reviewed and consolidated for max benefit. Patient has met or partially met all goals at this time and is ready for 30 day hold.    OBJECTIVE IMPAIRMENTS Abnormal gait, decreased balance, and dizziness.   ACTIVITY LIMITATIONS cleaning, community activity, driving, meal prep, laundry, yard work, shopping, and church.   PERSONAL FACTORS Age, Behavior pattern, Past/current experiences, Time since onset of injury/illness/exacerbation, Transportation, and 3+ comorbidities: GERD, HLD, R RTC repair 2019  are also affecting patient's functional outcome.    REHAB POTENTIAL: Good  CLINICAL DECISION MAKING: Evolving/moderate complexity  EVALUATION COMPLEXITY: Moderate   PLAN: PT FREQUENCY: 1-2x/week  PT DURATION: 4 weeks  PLANNED INTERVENTIONS: Therapeutic exercises, Therapeutic activity, Neuromuscular re-education, Balance training, Gait training, Patient/Family education, Joint mobilization, Stair training, Vestibular training, Canalith repositioning, DME instructions, Dry Needling, Electrical stimulation, Cryotherapy, Moist heat, Taping, and Manual therapy  PLAN FOR NEXT SESSION:  30 day hold at this time  Janene Harvey, PT, DPT 08/16/21 10:48 AM  [facility] at [facility] [address], [place], [place] [address] Phone # [phone] Fax # [phone]    PHYSICAL THERAPY DISCHARGE SUMMARY  Visits from Start of Care: 12  Current functional level related to goals / functional outcomes: See above clinical impression; patient did not return during 30 day hold   Remaining deficits: Imbalance on compliant surface and mild dizziness with bed mobility    Education / Equipment: HEP  Plan: Patient agrees to discharge.  Patient goals were partially met. Patient is being discharged due to satisfaction with  CLOF.    Janene Harvey, PT, DPT 10/15/21 12:06 PM  [facility] at [facility] [address] [place], [place], [place] [address] Phone # [phone] Fax # [phone]

## 2021-08-15 ENCOUNTER — Ambulatory Visit: Payer: Medicare Other

## 2021-08-15 DIAGNOSIS — R0609 Other forms of dyspnea: Secondary | ICD-10-CM

## 2021-08-16 ENCOUNTER — Encounter: Payer: Self-pay | Admitting: Physical Therapy

## 2021-08-16 ENCOUNTER — Ambulatory Visit: Payer: Medicare Other | Admitting: Physical Therapy

## 2021-08-16 DIAGNOSIS — H8113 Benign paroxysmal vertigo, bilateral: Secondary | ICD-10-CM | POA: Diagnosis not present

## 2021-08-16 DIAGNOSIS — R2681 Unsteadiness on feet: Secondary | ICD-10-CM

## 2021-08-16 DIAGNOSIS — R42 Dizziness and giddiness: Secondary | ICD-10-CM

## 2021-08-18 LAB — LIPID PANEL
Chol/HDL Ratio: 2.9 ratio (ref 0.0–4.4)
Cholesterol, Total: 147 mg/dL (ref 100–199)
HDL: 50 mg/dL (ref >39)
LDL Chol Calc (NIH): 58 mg/dL (ref 0–99)
Triglycerides: 244 mg/dL — ABNORMAL HIGH (ref 0–149)
VLDL Cholesterol Cal: 39 mg/dL (ref 5–40)

## 2021-08-22 ENCOUNTER — Ambulatory Visit: Payer: Medicare Other

## 2021-08-22 DIAGNOSIS — I700 Atherosclerosis of aorta: Secondary | ICD-10-CM

## 2021-08-22 DIAGNOSIS — E782 Mixed hyperlipidemia: Secondary | ICD-10-CM

## 2021-08-22 DIAGNOSIS — R0609 Other forms of dyspnea: Secondary | ICD-10-CM

## 2021-08-27 MED ORDER — ROSUVASTATIN CALCIUM 20 MG PO TABS: 20.0000 mg | ORAL_TABLET | Freq: Every day | ORAL | 1 refills | Status: DC

## 2021-08-27 NOTE — Telephone Encounter (Signed)
From patient.

## 2021-10-01 ENCOUNTER — Encounter: Payer: Self-pay | Admitting: Cardiology

## 2021-10-01 ENCOUNTER — Ambulatory Visit
Admission: RE | Admit: 2021-10-01 | Discharge: 2021-10-01 | Disposition: A | Discharge status: Home / Self Care | Payer: Medicare Other | Source: Ambulatory Visit | Attending: Internal Medicine | Admitting: Internal Medicine

## 2021-10-01 ENCOUNTER — Other Ambulatory Visit: Payer: Self-pay | Admitting: Internal Medicine

## 2021-10-01 DIAGNOSIS — R058 Other specified cough: Secondary | ICD-10-CM

## 2021-10-02 NOTE — Telephone Encounter (Signed)
From pt

## 2021-10-06 ENCOUNTER — Encounter: Payer: Self-pay | Admitting: Cardiology

## 2021-10-07 ENCOUNTER — Other Ambulatory Visit: Payer: Self-pay | Admitting: Cardiology

## 2021-10-07 DIAGNOSIS — I700 Atherosclerosis of aorta: Secondary | ICD-10-CM

## 2021-10-07 DIAGNOSIS — E782 Mixed hyperlipidemia: Secondary | ICD-10-CM

## 2021-10-07 MED ORDER — ROSUVASTATIN CALCIUM 20 MG PO TABS: 20.0000 mg | ORAL_TABLET | Freq: Every day | ORAL | 3 refills | Status: DC

## 2021-10-09 NOTE — Telephone Encounter (Signed)
From patient.

## 2021-11-12 ENCOUNTER — Encounter: Payer: Self-pay | Admitting: Cardiology

## 2021-11-12 ENCOUNTER — Other Ambulatory Visit: Payer: Self-pay | Admitting: Cardiology

## 2021-11-12 DIAGNOSIS — I700 Atherosclerosis of aorta: Secondary | ICD-10-CM

## 2021-11-12 DIAGNOSIS — E782 Mixed hyperlipidemia: Secondary | ICD-10-CM

## 2021-11-12 NOTE — Telephone Encounter (Signed)
From patient.

## 2021-11-16 NOTE — Telephone Encounter (Signed)
From pt

## 2021-11-20 ENCOUNTER — Ambulatory Visit: Payer: Medicare Other | Admitting: Cardiology

## 2021-11-20 ENCOUNTER — Encounter: Payer: Self-pay | Admitting: Cardiology

## 2021-11-20 VITALS — BP 151/87 | HR 76 | Temp 97.8°F | Resp 16 | Ht 69.0 in | Wt 221.0 lb

## 2021-11-20 DIAGNOSIS — E538 Deficiency of other specified B group vitamins: Secondary | ICD-10-CM

## 2021-11-20 DIAGNOSIS — I700 Atherosclerosis of aorta: Secondary | ICD-10-CM

## 2021-11-20 DIAGNOSIS — E559 Vitamin D deficiency, unspecified: Secondary | ICD-10-CM

## 2021-11-20 DIAGNOSIS — E782 Mixed hyperlipidemia: Secondary | ICD-10-CM

## 2021-11-20 DIAGNOSIS — E210 Primary hyperparathyroidism: Secondary | ICD-10-CM

## 2021-11-20 DIAGNOSIS — I2510 Atherosclerotic heart disease of native coronary artery without angina pectoris: Secondary | ICD-10-CM

## 2021-11-20 LAB — LIPID PANEL
Chol/HDL Ratio: 2.6 ratio (ref 0.0–4.4)
Cholesterol, Total: 147 mg/dL (ref 100–199)
HDL: 56 mg/dL (ref >39)
LDL Chol Calc (NIH): 67 mg/dL (ref 0–99)
Triglycerides: 139 mg/dL (ref 0–149)
VLDL Cholesterol Cal: 24 mg/dL (ref 5–40)

## 2021-11-20 MED ORDER — ASPIRIN 81 MG PO TBEC: 81.0000 mg | DELAYED_RELEASE_TABLET | Freq: Every day | ORAL | 3 refills | Status: AC

## 2021-11-20 MED ORDER — EZETIMIBE 10 MG PO TABS: 10.0000 mg | ORAL_TABLET | Freq: Every day | ORAL | 3 refills | Status: DC

## 2021-11-20 MED ORDER — ROSUVASTATIN CALCIUM 20 MG PO TABS: 20.0000 mg | ORAL_TABLET | Freq: Every day | ORAL | 3 refills | Status: AC

## 2021-11-20 NOTE — Progress Notes (Signed)
Patient referred by Mackie Pai, PA-C for aortic atherosclerosis, coronary calcification  Subjective:   Olivia Hodge, female    DOB: June 20, 1950, 71 y.o.   MRN: [id number]  Chief Complaint  Patient presents with   Hyperlipidemia   Follow-up   Results    Lab results   HPI  71 y.o. Caucasian female with hypothyroidism, aortic atherosclerosis, coronary calcification, with exertional dyspnea  Patient is retired, lives with her husband in [facility].  She stays active, walks around a mile a day regularly.  She denies any chest pain.  However, she has noticed dyspnea on climbing up a flight of stairs.  She also notices swelling easily while doing household chores such as vacuuming etc.  She quit smoking in 1990s.  Recent CT scan for lung cancer screening showed aortic atherosclerosis and coronary calcification.  She has been on Zetia for many years.  Lipid panel reviewed with the patient.  She has not been on statin before.  She has been started on aspirin by PCP.  She is tolerating it well without any gastric side effects or melena.  Patient does have history of polyps, and is due to get repeat colonoscopy in 3 years.  Patient uses ibuprofen couple times a week for arthritis.  However, she is trying to substitute this with Tylenol.   She presents today for follow-up after stress test and echo. Overall, she is doing well today without complaints of chest pain, shortness of breath, or dizziness.   Current Outpatient Medications:    azelastine (ASTELIN) 0.1 % nasal spray, SPRAY TWICE IN EACH NOSTRIL TWICE DAILY, Disp: 30 mL, Rfl: 4   Bepotastine Besilate 1.5 % SOLN, , Disp: , Rfl:    cholecalciferol (VITAMIN D3) 25 MCG (1000 UNIT) tablet, Take 1,000 Units by mouth every other day., Disp: , Rfl:    Cyanocobalamin (VITAMIN B 12 PO), Take by mouth., Disp: , Rfl:    fluticasone (FLONASE) 50 MCG/ACT nasal spray, Place 2 sprays into both nostrils daily., Disp: 16 g, Rfl: 1    levothyroxine (SYNTHROID) 137 MCG tablet, Take 137 mcg by mouth every morning., Disp: , Rfl:    montelukast (SINGULAIR) 10 MG tablet, TAKE 1 TABLET AT BEDTIME, Disp: 90 tablet, Rfl: 3   NON FORMULARY, Doterra supplements, Disp: , Rfl:    Probiotic Product (PROBIOTIC DAILY PO), Take 1 tablet by mouth daily., Disp: , Rfl:    sertraline (ZOLOFT) 100 MG tablet, TAKE 1 TABLET DAILY, Disp: 90 tablet, Rfl: 3   aspirin EC (ASPIRIN 81) 81 MG tablet, Take 1 tablet (81 mg total) by mouth daily., Disp: 30 tablet, Rfl: 3   ezetimibe (ZETIA) 10 MG tablet, Take 1 tablet (10 mg total) by mouth daily., Disp: 90 tablet, Rfl: 3   rosuvastatin (CRESTOR) 20 MG tablet, Take 1 tablet (20 mg total) by mouth daily., Disp: 90 tablet, Rfl: 3   Cardiovascular and other pertinent studies:  Reviewed external labs and tests, independently interpreted  Exercise Myoview stress test 08/22/2021: Exercise nuclear stress test was performed using Bruce protocol.   1 Day Rest and Stress images. Exercise time 4 minute 48 seconds, achieved 6.76 METS, 87% APMHR.  Hypertensive response to exercise: No (rest 148/84, peak 187/87).  Stress ECG negative for ischemia. Normal myocardial perfusion without evidence of reversible myocardial ischemia or prior infarct. Left ventricular size normal, wall thickness preserved, calculated LVEF 52% visually appears preserved. No prior studies available for comparison. Low risk study.  Echocardiogram 08/15/2021:  Hyperdynamic LV systolic  function with visual EF >70%. Left ventricle  cavity is normal in size. Mild left ventricular hypertrophy. Normal global  wall motion. Normal diastolic filling pattern, normal LAP.  Left atrial cavity is normal in size. 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  Right ventricle cavity is normal in size. Mild hypertrophy of the right  ventricle. Normal right ventricular function.  No significant valvular heart disease.  No prior  study for comparison.  EKG 08/06/2021: Sinus rhythm 80 bpm  Nonspecific T-abnormality  CT chest 07/05/2020: 1. Lung-RADS 2, benign appearance or behavior. Continue annual screening with low-dose chest CT without contrast in 12 months. 2. Coronary artery calcifications noted. 3. Moderate size hiatal hernia.   Aortic Atherosclerosis (ICD10-I70.0) and Emphysema (ICD10-J43.9).    Recent labs: 11/19/2021: Chol 147, TG 139, HDL 56, LDL 67  07/15/2021: Glucose 99, BUN/Cr 16/0.83. EGFR 75. Na/K 140/4.6. Rest of the CMP normal H/H 13/40. MCV 92. Platelets 187 Chol 230, TG 164, HDL 54, LDL 140  Review of Systems  Cardiovascular:  Negative for chest pain, dyspnea on exertion, leg swelling, palpitations and syncope.  Respiratory:  Negative for shortness of breath.    Vitals:   11/20/21 1549  BP: (!) 151/87  Pulse: 76  Resp: 16  Temp: 97.8 F (36.6 C)  SpO2: 93%   Body mass index is 32.64 kg/m. Filed Weights   11/20/21 1549  Weight: 221 lb (100.2 kg)   Objective:   Physical Exam Vitals and nursing note reviewed.  Constitutional:      General: She is not in acute distress. Neck:     Vascular: No JVD.  Cardiovascular:     Rate and Rhythm: Normal rate and regular rhythm.     Pulses: Normal pulses.     Heart sounds: Normal heart sounds. No murmur heard. Pulmonary:     Effort: Pulmonary effort is normal.     Breath sounds: Normal breath sounds. No wheezing or rales.  Musculoskeletal:     Right lower leg: No edema.     Left lower leg: No edema.    Visit diagnoses:   ICD-10-CM   1. Aortic atherosclerosis (HCC)  I70.0 rosuvastatin (CRESTOR) 20 MG tablet    2. Coronary artery calcification  I25.10    I25.84     3. Mixed hyperlipidemia  E78.2     4. B12 deficiency  E53.8 ezetimibe (ZETIA) 10 MG tablet    5. Vitamin D deficiency  E55.9 ezetimibe (ZETIA) 10 MG tablet    6. Hyperparathyroidism, primary ([place])  E21.0 ezetimibe (ZETIA) 10 MG tablet      No orders of the  defined types were placed in this encounter.   Meds ordered this encounter  Medications   aspirin EC (ASPIRIN 81) 81 MG tablet    Sig: Take 1 tablet (81 mg total) by mouth daily.    Dispense:  30 tablet    Refill:  3    Order Specific Question:   Supervising Provider    Answer:   Adrian Prows [[id number]]   ezetimibe (ZETIA) 10 MG tablet    Sig: Take 1 tablet (10 mg total) by mouth daily.    Dispense:  90 tablet    Refill:  3    Order Specific Question:   Supervising Provider    Answer:   Adrian Prows [[id number]]   rosuvastatin (CRESTOR) 20 MG tablet    Sig: Take 1 tablet (20 mg total) by mouth daily.    Dispense:  90  tablet    Refill:  3    Order Specific Question:   Supervising Provider    Answer:   Adrian Prows [[id number]]     Assessment & Recommendations:   71 y.o. Caucasian female with hypothyroidism, aortic atherosclerosis, coronary calcification, with exertional dyspnea  Aortic atherosclerosis, coronary calcification, mixed hyperlipidemia: LDL down to 67 on 11/19/21. Continue Zetia 61m daily and Crestor 20g daily.  Blood pressure was elevated at today's visit and during exercise stress test. She does monitor her blood pressure at home and was keeping a log that she gave to her PCP and readings were well controlled. Advised her to monitor blood pressure regularly at home and notify office if BP >140/80. If BP is elevated with home readings, will consider started antihypertensive medication.   Exercise stress test on 08/22/21 reassuring for no ischemia. Given, aortic atherosclerosis will continue Aspirin 866mdaily. She is tolerating this without bleeding diathesis.  Medications refills sent for Crestor, Zetia, and Aspirin.  Follow-up in 6 months or sooner if needed.   BrErnst SpellNP  Office: [phone]

## 2021-11-21 ENCOUNTER — Ambulatory Visit: Payer: Medicare Other | Admitting: Cardiology

## 2022-03-19 ENCOUNTER — Telehealth (HOSPITAL_BASED_OUTPATIENT_CLINIC_OR_DEPARTMENT_OTHER): Payer: Self-pay

## 2022-03-19 ENCOUNTER — Other Ambulatory Visit (HOSPITAL_BASED_OUTPATIENT_CLINIC_OR_DEPARTMENT_OTHER): Payer: Self-pay | Admitting: Internal Medicine

## 2022-03-19 DIAGNOSIS — Z87891 Personal history of nicotine dependence: Secondary | ICD-10-CM

## 2022-03-20 ENCOUNTER — Other Ambulatory Visit (HOSPITAL_BASED_OUTPATIENT_CLINIC_OR_DEPARTMENT_OTHER): Payer: Self-pay | Admitting: Internal Medicine

## 2022-03-20 DIAGNOSIS — Z1231 Encounter for screening mammogram for malignant neoplasm of breast: Secondary | ICD-10-CM

## 2022-04-02 ENCOUNTER — Encounter (HOSPITAL_BASED_OUTPATIENT_CLINIC_OR_DEPARTMENT_OTHER): Payer: Self-pay

## 2022-04-02 ENCOUNTER — Other Ambulatory Visit (HOSPITAL_BASED_OUTPATIENT_CLINIC_OR_DEPARTMENT_OTHER): Payer: Self-pay

## 2022-04-02 ENCOUNTER — Ambulatory Visit (HOSPITAL_BASED_OUTPATIENT_CLINIC_OR_DEPARTMENT_OTHER)
Admission: RE | Admit: 2022-04-02 | Discharge: 2022-04-02 | Disposition: A | Discharge status: Home / Self Care | Payer: Medicare Other | Source: Ambulatory Visit | Attending: Internal Medicine | Admitting: Internal Medicine

## 2022-04-02 DIAGNOSIS — Z1231 Encounter for screening mammogram for malignant neoplasm of breast: Secondary | ICD-10-CM | POA: Diagnosis present

## 2022-04-02 DIAGNOSIS — Z87891 Personal history of nicotine dependence: Secondary | ICD-10-CM | POA: Diagnosis present

## 2022-04-02 MED ORDER — AREXVY 120 MCG/0.5ML IM SUSR
INTRAMUSCULAR | 0 refills | Status: AC
  Filled 2022-04-02: qty 1, 1d supply, fill #0

## 2022-04-22 ENCOUNTER — Other Ambulatory Visit: Payer: Self-pay | Admitting: Medical

## 2022-05-23 ENCOUNTER — Ambulatory Visit: Payer: Medicare Other | Admitting: Cardiology

## 2022-05-27 ENCOUNTER — Other Ambulatory Visit: Payer: Self-pay | Admitting: Medical

## 2022-05-27 DIAGNOSIS — E559 Vitamin D deficiency, unspecified: Secondary | ICD-10-CM

## 2022-05-27 DIAGNOSIS — E210 Primary hyperparathyroidism: Secondary | ICD-10-CM

## 2022-05-27 DIAGNOSIS — E538 Deficiency of other specified B group vitamins: Secondary | ICD-10-CM

## 2022-07-20 IMAGING — MG DIGITAL SCREENING BILAT W/ TOMO W/ CAD
8 series · 8 of 24 positions shown · non-contrast
Comparison: Previous exam(s).

CLINICAL DATA: Screening.

EXAM:
DIGITAL SCREENING BILATERAL MAMMOGRAM WITH TOMO AND CAD

[R MLO synth-2D]
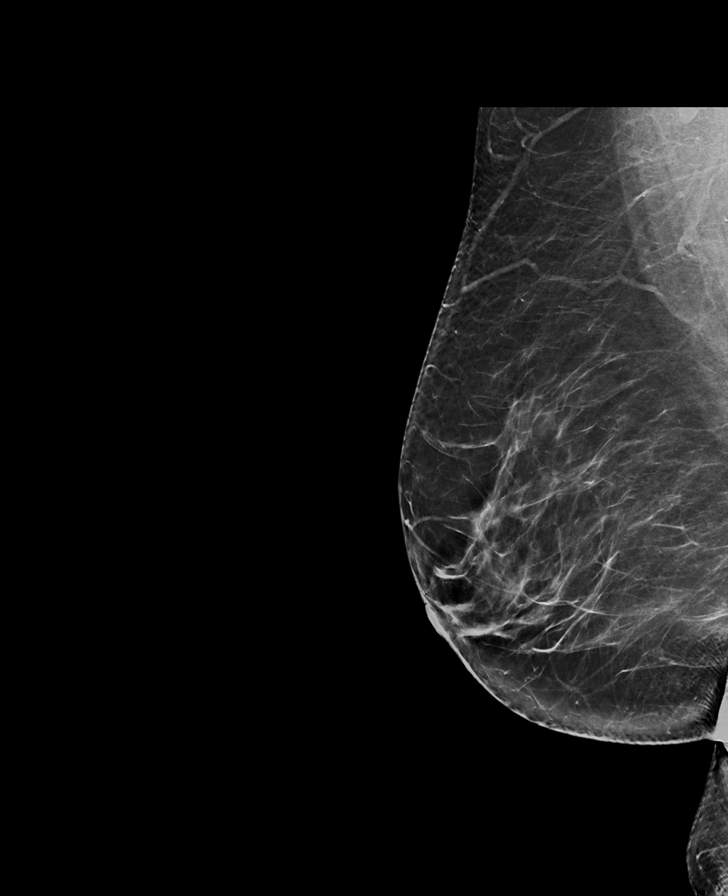

[L MLO synth-2D]
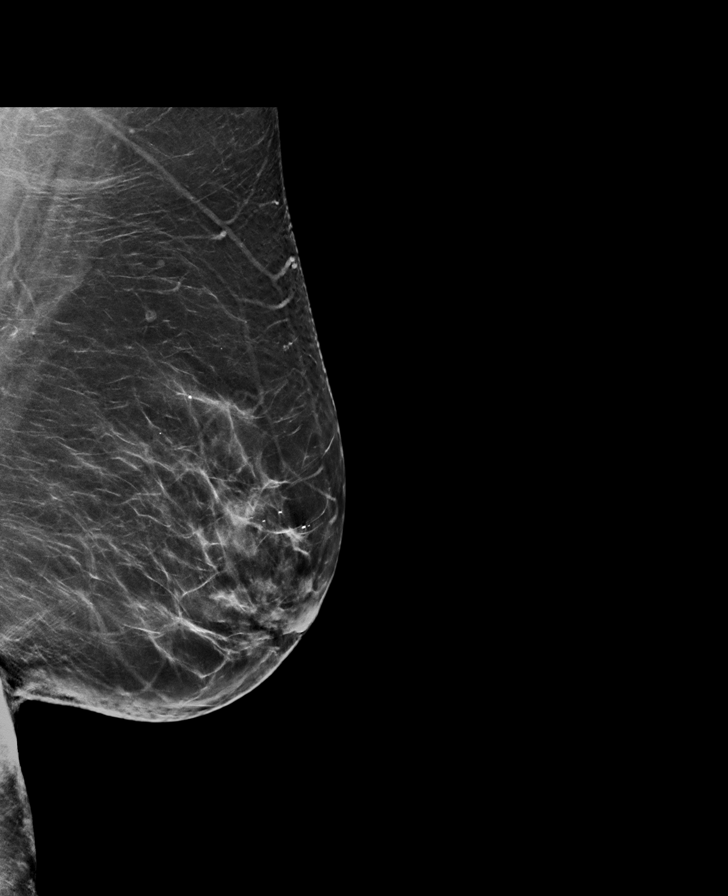

[L CC synth-2D]
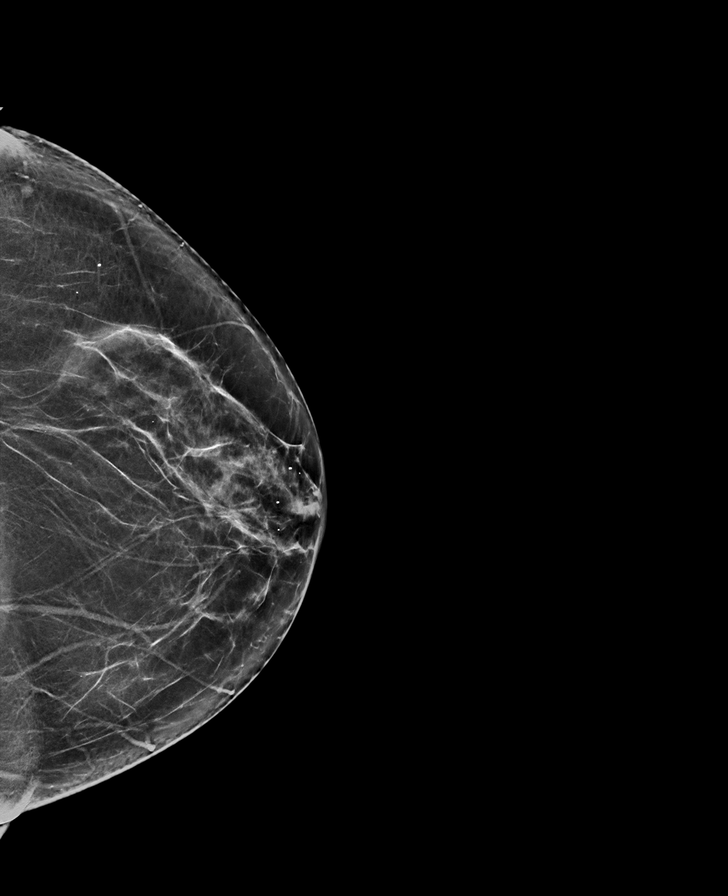

[R CC synth-2D]
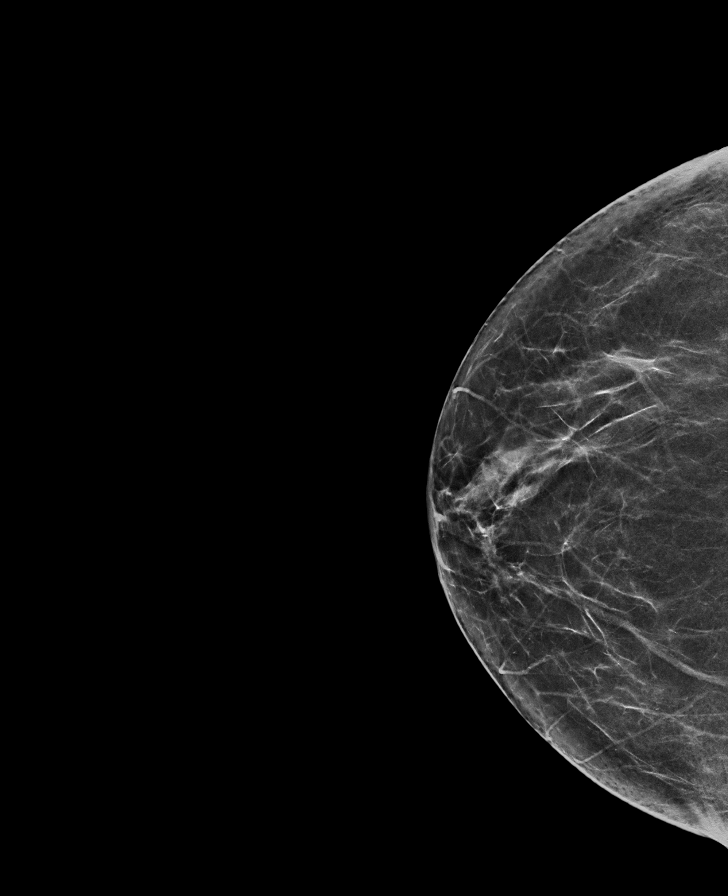

[R MLO tomo · tomo slice 40/79.0]
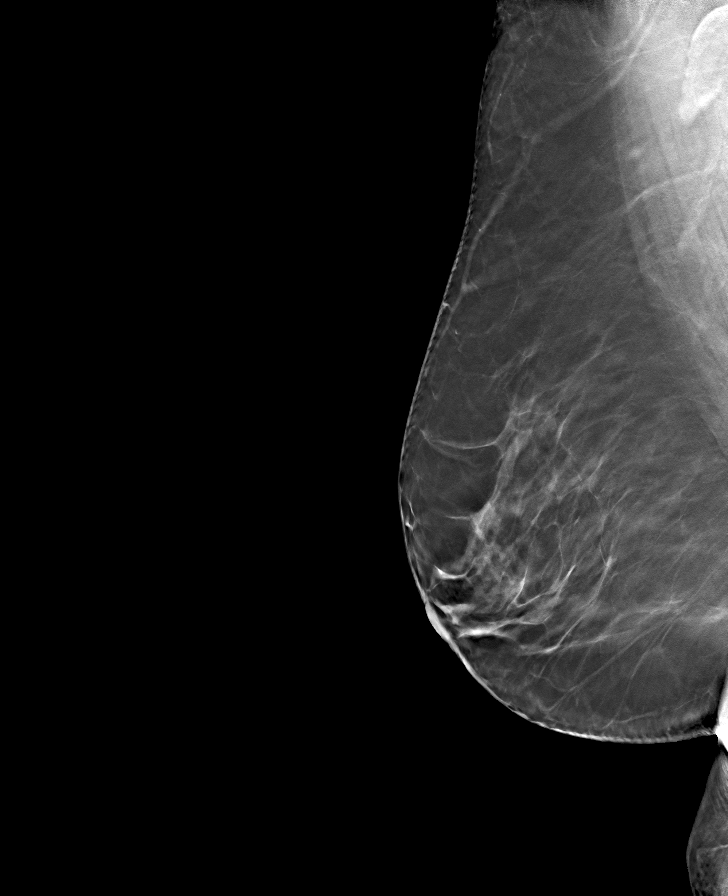

[L MLO tomo · tomo slice 39/77.0]
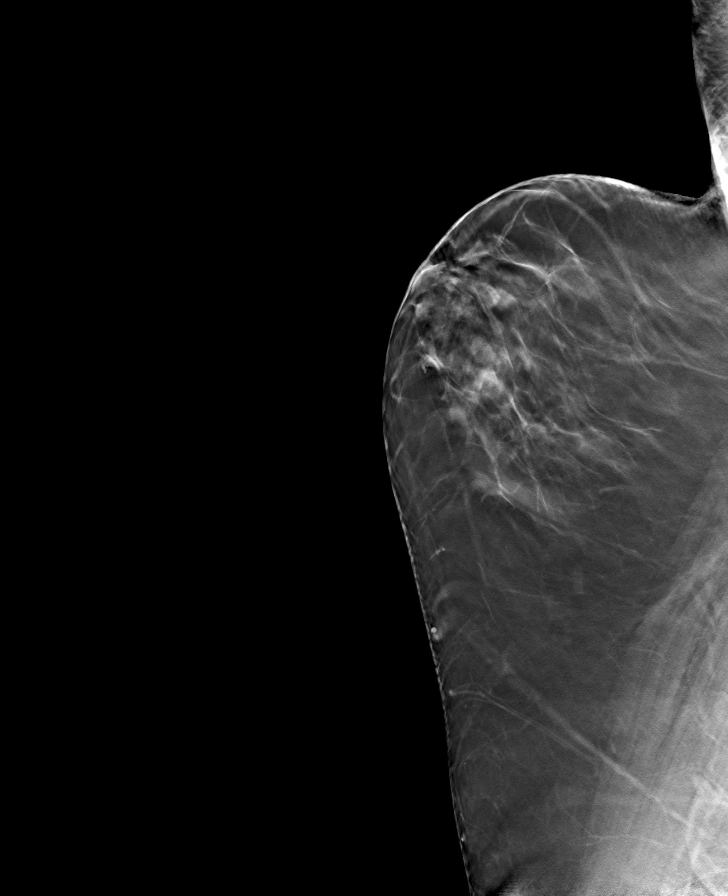

[R CC tomo · tomo slice 33/66.0]
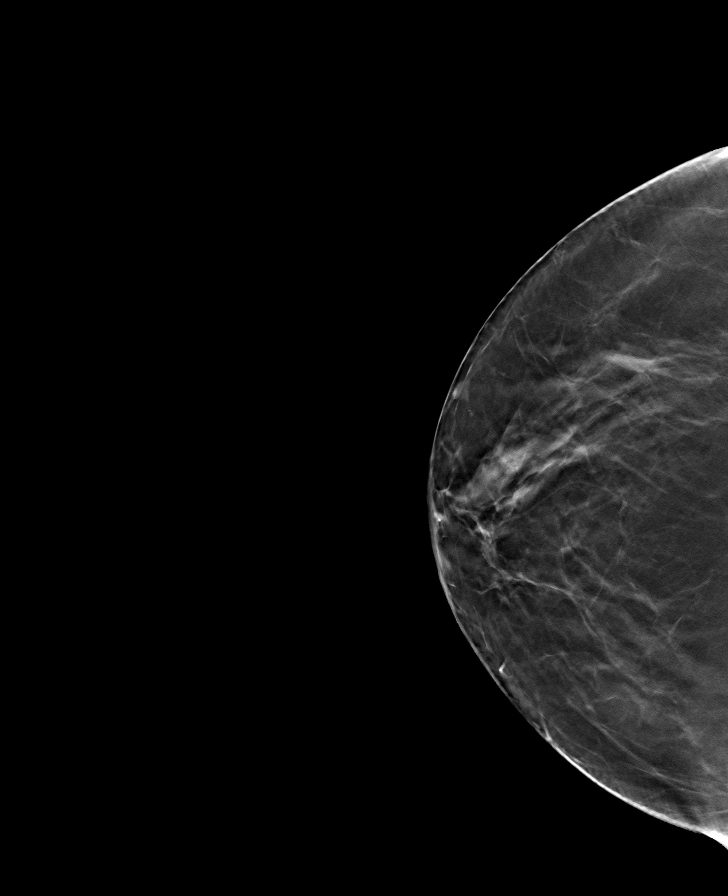

[L CC tomo · tomo slice 35/69.0]
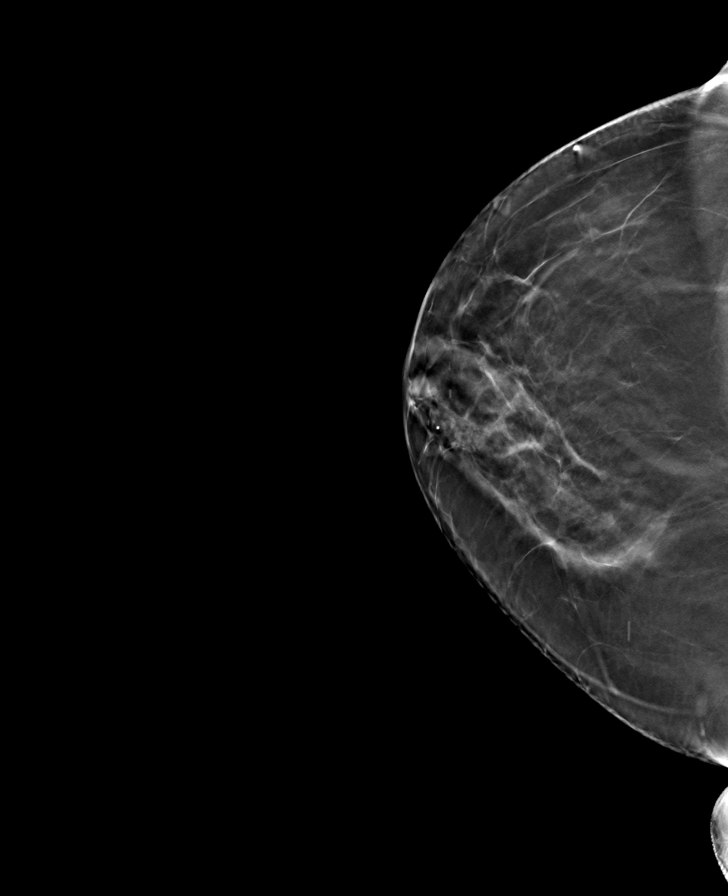

[8 of 24 positions shown; findings below may reference images not displayed]

ACR Breast Density Category b: There are scattered areas of
fibroglandular density.
FINDINGS: There are no findings suspicious for malignancy. Images were
processed with CAD.
IMPRESSION: No mammographic evidence of malignancy. A result letter of this
screening mammogram will be mailed directly to the patient.

RECOMMENDATION:
Screening mammogram in one year. (Code:[id number])

BI-RADS CATEGORY  1: Negative.

## 2022-11-15 ENCOUNTER — Other Ambulatory Visit: Payer: Self-pay

## 2022-11-15 DIAGNOSIS — E210 Primary hyperparathyroidism: Secondary | ICD-10-CM

## 2022-11-15 DIAGNOSIS — E538 Deficiency of other specified B group vitamins: Secondary | ICD-10-CM

## 2022-11-15 DIAGNOSIS — E559 Vitamin D deficiency, unspecified: Secondary | ICD-10-CM

## 2023-01-17 ENCOUNTER — Other Ambulatory Visit: Payer: Self-pay

## 2023-01-17 DIAGNOSIS — I700 Atherosclerosis of aorta: Secondary | ICD-10-CM

## 2023-04-08 ENCOUNTER — Other Ambulatory Visit (HOSPITAL_BASED_OUTPATIENT_CLINIC_OR_DEPARTMENT_OTHER): Payer: Self-pay | Admitting: Internal Medicine

## 2023-04-08 ENCOUNTER — Encounter: Payer: Self-pay | Admitting: Gastroenterology

## 2023-04-08 DIAGNOSIS — Z1231 Encounter for screening mammogram for malignant neoplasm of breast: Secondary | ICD-10-CM

## 2023-04-18 ENCOUNTER — Ambulatory Visit: Payer: Medicare Other

## 2023-04-18 VITALS — Ht 69.0 in | Wt 220.0 lb

## 2023-04-18 DIAGNOSIS — Z1211 Encounter for screening for malignant neoplasm of colon: Secondary | ICD-10-CM

## 2023-04-18 MED ORDER — NA SULFATE-K SULFATE-MG SULF 17.5-3.13-1.6 GM/177ML PO SOLN: 1.0000 | Freq: Once | ORAL | 0 refills | Status: AC

## 2023-04-18 NOTE — Progress Notes (Signed)
No egg or soy allergy known to patient  No issues known to pt with past sedation with any surgeries or procedures Patient denies ever being told they had issues or difficulty with intubation  No FH of Malignant Hyperthermia Pt is not on diet pills Pt is not on  home 02  Pt is not on blood thinners  Pt denies issues with constipation  No A fib or A flutter Have any cardiac testing pending--No Pt can ambulate  Pt denies use of chewing tobacco Discussed diabetic I weight loss medication holds Discussed NSAID holds Checked BMI Pt instructed to use [employer] or GoodRx for a price reduction on prep  Pre visit completed

## 2023-04-24 ENCOUNTER — Encounter: Payer: Self-pay | Admitting: Gastroenterology

## 2023-04-25 ENCOUNTER — Ambulatory Visit: Payer: Medicare Other | Admitting: Gastroenterology

## 2023-04-25 ENCOUNTER — Encounter: Payer: Self-pay | Admitting: Gastroenterology

## 2023-04-25 VITALS — BP 122/69 | HR 75 | Temp 97.5°F | Resp 12 | Ht 69.0 in | Wt 220.0 lb

## 2023-04-25 DIAGNOSIS — K640 First degree hemorrhoids: Secondary | ICD-10-CM

## 2023-04-25 DIAGNOSIS — D122 Benign neoplasm of ascending colon: Secondary | ICD-10-CM | POA: Diagnosis not present

## 2023-04-25 DIAGNOSIS — Z1211 Encounter for screening for malignant neoplasm of colon: Secondary | ICD-10-CM | POA: Diagnosis not present

## 2023-04-25 DIAGNOSIS — K5730 Diverticulosis of large intestine without perforation or abscess without bleeding: Secondary | ICD-10-CM

## 2023-04-25 DIAGNOSIS — D124 Benign neoplasm of descending colon: Secondary | ICD-10-CM

## 2023-04-25 DIAGNOSIS — D123 Benign neoplasm of transverse colon: Secondary | ICD-10-CM

## 2023-04-25 DIAGNOSIS — D128 Benign neoplasm of rectum: Secondary | ICD-10-CM

## 2023-04-25 MED ORDER — SODIUM CHLORIDE 0.9 % IV SOLN: 500.0000 mL | Freq: Once | INTRAVENOUS | Status: DC

## 2023-04-25 NOTE — Patient Instructions (Signed)
 Thank you for letting us take care of your healthcare needs today. Please see handouts given to you on Polyps, Diverticulosis and Hemorrhoids.     YOU HAD AN ENDOSCOPIC PROCEDURE TODAY AT THE [facility]:   Refer to the procedure report that was given to you for any specific questions about what was found during the examination.  If the procedure report does not answer your questions, please call your gastroenterologist to clarify.  If you requested that your care partner not be given the details of your procedure findings, then the procedure report has been included in a sealed envelope for you to review at your convenience later.  YOU SHOULD EXPECT: Some feelings of bloating in the abdomen. Passage of more gas than usual.  Walking can help get rid of the air that was put into your GI tract during the procedure and reduce the bloating. If you had a lower endoscopy (such as a colonoscopy or flexible sigmoidoscopy) you may notice spotting of blood in your stool or on the toilet paper. If you underwent a bowel prep for your procedure, you may not have a normal bowel movement for a few days.  Please Note:  You might notice some irritation and congestion in your nose or some drainage.  This is from the oxygen used during your procedure.  There is no need for concern and it should clear up in a day or so.  SYMPTOMS TO REPORT IMMEDIATELY:  Following lower endoscopy (colonoscopy or flexible sigmoidoscopy):  Excessive amounts of blood in the stool  Significant tenderness or worsening of abdominal pains  Swelling of the abdomen that is new, acute  Fever of 100F or higher   For urgent or emergent issues, a gastroenterologist can be reached at any hour by calling [phone]. Do not use MyChart messaging for urgent concerns.    DIET:  We do recommend a small meal at first, but then you may proceed to your regular diet.  Drink plenty of fluids but you should avoid alcoholic beverages  for 24 hours.  ACTIVITY:  You should plan to take it easy for the rest of today and you should NOT DRIVE or use heavy machinery until tomorrow (because of the sedation medicines used during the test).    FOLLOW UP: Our staff will call the number listed on your records the next business day following your procedure.  We will call around 7:15- 8:00 am to check on you and address any questions or concerns that you may have regarding the information given to you following your procedure. If we do not reach you, we will leave a message.     If any biopsies were taken you will be contacted by phone or by letter within the next 1-3 weeks.  Please call us at [phone] if you have not heard about the biopsies in 3 weeks.    SIGNATURES/CONFIDENTIALITY: You and/or your care partner have signed paperwork which will be entered into your electronic medical record.  These signatures attest to the fact that that the information above on your After Visit Summary has been reviewed and is understood.  Full responsibility of the confidentiality of this discharge information lies with you and/or your care-partner.

## 2023-04-25 NOTE — Progress Notes (Signed)
 Sedate, gd SR, tolerated procedure well, VSS, report to RN

## 2023-04-25 NOTE — Progress Notes (Signed)
 [facility] History and Physical   Primary Care Physician:  Thana Ates, MD   Reason for Procedure:   H/O polyps  Plan:    colon     HPI: Olivia Hodge is a 73 y.o. female    Past Medical History:  Diagnosis Date   Allergic state 08/12/2016   Allergy    B12 deficiency    Blood transfusion without reported diagnosis    Cataract    removed both eyes    Diverticulitis 2015   GERD (gastroesophageal reflux disease)    occ in the past    Hemorrhoids    History of chicken pox    Hyperlipidemia    Melanoma of skin (HCC)    Thyroid disease    Vitamin D deficiency 05/11/2015    Past Surgical History:  Procedure Laterality Date   APPENDECTOMY     CATARACT EXTRACTION, BILATERAL Bilateral    COLONOSCOPY     ~ 10 yrs ago - woke up during colon at [facility]    EYE SURGERY Bilateral 2018   cataracts, Dr Ranae Pila   MELANOMA EXCISION Left    7 years ago, spot on ear   SHOULDER SURGERY Right 04/10/2017   rotator cuff repair    Prior to Admission medications   Medication Sig Start Date End Date Taking? Authorizing Provider  Calcium Carb-Cholecalciferol (CALCIUM CARBONATE+VITAMIN D PO) Take 2 tablets by mouth daily.   Yes [provider]  ezetimibe (ZETIA) 10 MG tablet TAKE 1 TABLET DAILY 11/15/22  Yes Yates Decamp, MD  levothyroxine (SYNTHROID) 137 MCG tablet Take 137 mcg by mouth every morning. 07/18/21  Yes [provider]  loratadine (CLARITIN) 10 MG tablet Take 10 mg by mouth daily as needed. 07/24/18  Yes [provider]  montelukast (SINGULAIR) 10 MG tablet TAKE 1 TABLET AT BEDTIME 04/26/21  Yes Saguier, Ramon Dredge, PA-C  NON FORMULARY Doterra supplements   Yes [provider]  sertraline (ZOLOFT) 100 MG tablet TAKE 1 TABLET DAILY 05/27/22  Yes Saguier, Ramon Dredge, PA-C  aspirin EC (ASPIRIN 81) 81 MG tablet Take 1 tablet (81 mg total) by mouth daily. Patient not taking: Reported on 04/25/2023 11/20/21   Nori Riis, NP  azelastine  (ASTELIN) 0.1 % nasal spray SPRAY TWICE IN EACH NOSTRIL TWICE DAILY 06/05/20   Saguier, Ramon Dredge, PA-C  Bepotastine Besilate 1.5 % SOLN  01/17/20   [provider]  cholecalciferol (VITAMIN D3) 25 MCG (1000 UNIT) tablet Take 1,000 Units by mouth every other day. Patient not taking: Reported on 04/25/2023    [provider]  Cyanocobalamin (VITAMIN B 12 PO) Take by mouth. Patient not taking: Reported on 04/25/2023    [provider]  fluticasone (FLONASE) 50 MCG/ACT nasal spray Place 2 sprays into both nostrils daily. 06/05/20   Saguier, Ramon Dredge, PA-C  Probiotic Product (PROBIOTIC DAILY PO) Take 1 tablet by mouth daily. Patient not taking: Reported on 04/25/2023    [provider]  rosuvastatin (CRESTOR) 20 MG tablet Take 1 tablet (20 mg total) by mouth daily. 11/20/21 04/18/23  Nori Riis, NP  RSV vaccine recomb adjuvanted (AREXVY) 120 MCG/0.5ML injection Inject into the muscle. 04/02/22   Judyann Munson, MD    Current Outpatient Medications  Medication Sig Dispense Refill   Calcium Carb-Cholecalciferol (CALCIUM CARBONATE+VITAMIN D PO) Take 2 tablets by mouth daily.     ezetimibe (ZETIA) 10 MG tablet TAKE 1 TABLET DAILY 90 tablet 3   levothyroxine (SYNTHROID) 137 MCG tablet Take 137 mcg by mouth every morning.  loratadine (CLARITIN) 10 MG tablet Take 10 mg by mouth daily as needed.     montelukast (SINGULAIR) 10 MG tablet TAKE 1 TABLET AT BEDTIME 90 tablet 3   NON FORMULARY Doterra supplements     sertraline (ZOLOFT) 100 MG tablet TAKE 1 TABLET DAILY 90 tablet 3   aspirin EC (ASPIRIN 81) 81 MG tablet Take 1 tablet (81 mg total) by mouth daily. (Patient not taking: Reported on 04/25/2023) 30 tablet 3   azelastine (ASTELIN) 0.1 % nasal spray SPRAY TWICE IN EACH NOSTRIL TWICE DAILY 30 mL 4   Bepotastine Besilate 1.5 % SOLN  (Patient not taking: Reported on 04/25/2023)     cholecalciferol (VITAMIN D3) 25 MCG (1000 UNIT) tablet Take 1,000 Units by mouth every  other day. (Patient not taking: Reported on 04/25/2023)     Cyanocobalamin (VITAMIN B 12 PO) Take by mouth. (Patient not taking: Reported on 04/25/2023)     fluticasone (FLONASE) 50 MCG/ACT nasal spray Place 2 sprays into both nostrils daily. 16 g 1   Probiotic Product (PROBIOTIC DAILY PO) Take 1 tablet by mouth daily. (Patient not taking: Reported on 04/25/2023)     rosuvastatin (CRESTOR) 20 MG tablet Take 1 tablet (20 mg total) by mouth daily. 90 tablet 3   RSV vaccine recomb adjuvanted (AREXVY) 120 MCG/0.5ML injection Inject into the muscle. 1 each 0   Current Facility-Administered Medications  Medication Dose Route Frequency Provider Last Rate Last Admin   0.9 %  sodium chloride infusion  500 mL Intravenous Once Lynann Bologna, MD        Allergies as of 04/25/2023 - Review Complete 04/25/2023  Allergen Reaction Noted   Penicillins Hives 12/21/2013   Tetracyclines & related Nausea And Vomiting 12/21/2013   Erythromycin Nausea Only 05/11/2015   Sulfa antibiotics Other (See Comments) 07/18/2021    Family History  Adopted: Yes  Problem Relation Age of Onset   COPD Daughter    Birth defects Daughter        recurrent bronchitis   Ulcers Daughter    ADD / ADHD Son    Colon cancer Neg Hx    Esophageal cancer Neg Hx    Pancreatic cancer Neg Hx    Prostate cancer Neg Hx    Rectal cancer Neg Hx    Stomach cancer Neg Hx     Social History   Socioeconomic History   Marital status: Married    Spouse name: Not on file   Number of children: 3   Years of education: Not on file   Highest education level: Not on file  Occupational History   Not on file  Tobacco Use   Smoking status: Former    Current packs/day: 0.00    Average packs/day: 1 pack/day for 25.0 years (25.0 ttl pk-yrs)    Types: Cigarettes    Start date: 02/26/1967    Quit date: 02/26/1992    Years since quitting: 31.1   Smokeless tobacco: Never  Vaping Use   Vaping status: Never Used  Substance and Sexual Activity    Alcohol use: Yes    Comment: wine daily    Drug use: No   Sexual activity: Not on file    Comment: lives with husband, retired from [employer] for [employer], No major dietary restrictions  Other Topics Concern   Not on file  Social History Narrative   Not on file   Social Drivers of Health   Financial Resource Strain: Low Risk  (08/05/2019)   Overall Physicist, medical Strain (  CARDIA)    Difficulty of Paying Living Expenses: Not hard at all  Food Insecurity: No Food Insecurity (08/05/2019)   Hunger Vital Sign    Worried About Running Out of Food in the Last Year: Never true    Ran Out of Food in the Last Year: Never true  Transportation Needs: No Transportation Needs (08/05/2019)   PRAPARE - Administrator, Civil Service (Medical): No    Lack of Transportation (Non-Medical): No  Physical Activity: Not on file  Stress: Not on file  Social Connections: Not on file  Intimate Partner Violence: Not on file    Review of Systems: Positive for none All other review of systems negative except as mentioned in the HPI.  Physical Exam: Vital signs in last 24 hours: @VSRANGES @   General:   Alert,  Well-developed, well-nourished, pleasant and cooperative in NAD Lungs:  Clear throughout to auscultation.   Heart:  Regular rate and rhythm; no murmurs, clicks, rubs,  or gallops. Abdomen:  Soft, nontender and nondistended. Normal bowel sounds.   Neuro/Psych:  Alert and cooperative. Normal mood and affect. A and O x 3    No significant changes were identified.  The patient continues to be an appropriate candidate for the planned procedure and anesthesia.   Edman Circle, MD. [facility] 04/25/2023 1:38 PM@

## 2023-04-25 NOTE — Progress Notes (Signed)
 Pt's states no medical or surgical changes since previsit or office visit.

## 2023-04-25 NOTE — Progress Notes (Signed)
 Called to room to assist during endoscopic procedure.  Patient ID and intended procedure confirmed with present staff. Received instructions for my participation in the procedure from the performing physician.

## 2023-04-25 NOTE — Op Note (Signed)
 [facility] Patient Name: Olivia Hodge Husband Procedure Date: 04/25/2023 1:34 PM MRN: [id number] Endoscopist: Lynann Bologna , MD, [id number] Age: 73 Referring MD:  Date of Birth: [birth year] Gender: Female Account #: [id number] Procedure:                Colonoscopy Indications:              High risk colon cancer surveillance: Personal                            history of colonic polyps Medicines:                Monitored Anesthesia Care Procedure:                Pre-Anesthesia Assessment:                           - Prior to the procedure, a History and Physical                            was performed, and patient medications and                            allergies were reviewed. The patient's tolerance of                            previous anesthesia was also reviewed. The risks                            and benefits of the procedure and the sedation                            options and risks were discussed with the patient.                            All questions were answered, and informed consent                            was obtained. Prior Anticoagulants: The patient has                            taken no anticoagulant or antiplatelet agents. ASA                            Grade Assessment: II - A patient with mild systemic                            disease. After reviewing the risks and benefits,                            the patient was deemed in satisfactory condition to                            undergo the procedure.  After obtaining informed consent, the colonoscope                            was passed under direct vision. Throughout the                            procedure, the patient's blood pressure, pulse, and                            oxygen saturations were monitored continuously. The                            Colonoscope was introduced through the anus and                            advanced to the the cecum, identified by                             appendiceal orifice and ileocecal valve. The                            colonoscopy was performed without difficulty. The                            patient tolerated the procedure well. The quality                            of the bowel preparation was good. The ileocecal                            valve, appendiceal orifice, and rectum were                            photographed. Patient got 2-day prep. Scope In: 1:46:59 PM Scope Out: 2:05:55 PM Scope Withdrawal Time: 0 hours 13 minutes 0 seconds  Total Procedure Duration: 0 hours 18 minutes 56 seconds  Findings:                 Three sessile polyps were found in the proximal                            descending colon, mid transverse colon and proximal                            ascending colon. The polyps were 5 to 6 mm in size.                            These polyps were removed with a cold snare.                            Resection and retrieval were complete.                           Two sessile polyps were found in  the rectum. The                            polyps were 4 to 5 mm in size. These polyps were                            removed with a cold snare. Resection and retrieval                            were complete.                           Multiple medium-mouthed diverticula were found in                            the sigmoid colon, few in transverse colon,                            descending colon and ascending colon. There was                            luminal narrowing in the sigmoid colon consistent                            with muscular hypertrophy. No endoscopic evidence                            of diverticulitis.                           Non-bleeding internal hemorrhoids were found during                            retroflexion. The hemorrhoids were small and Grade                            I (internal hemorrhoids that do not prolapse).                           The exam  was otherwise without abnormality on                            direct and retroflexion views. Complications:            No immediate complications. Estimated Blood Loss:     Estimated blood loss: none. Impression:               - Three 5 to 6 mm polyps in the proximal descending                            colon, in the mid transverse colon and in the                            proximal ascending colon, removed with a cold  snare. Resected and retrieved.                           - Two 4 to 5 mm polyps in the rectum, removed with                            a cold snare. Resected and retrieved.                           - Pancolonic diverticulosis predominantly in the                            sigmoid colon.                           - Non-bleeding internal hemorrhoids.                           - The examination was otherwise normal on direct                            and retroflexion views. Recommendation:           - Patient has a contact number available for                            emergencies. The signs and symptoms of potential                            delayed complications were discussed with the                            patient. Return to normal activities tomorrow.                            Written discharge instructions were provided to the                            patient.                           - High fiber diet.                           - Continue present medications.                           - Await pathology results.                           - Repeat colonoscopy for surveillance based on                            pathology results.                           - The findings and recommendations were discussed  with the patient's family. Lynann Bologna, MD 04/25/2023 2:11:34 PM This report has been signed electronically.

## 2023-04-28 ENCOUNTER — Ambulatory Visit (HOSPITAL_BASED_OUTPATIENT_CLINIC_OR_DEPARTMENT_OTHER)
Admission: RE | Admit: 2023-04-28 | Discharge: 2023-04-28 | Disposition: A | Discharge status: Home / Self Care | Payer: Medicare Other | Source: Ambulatory Visit | Attending: Internal Medicine | Admitting: Internal Medicine

## 2023-04-28 ENCOUNTER — Telehealth: Payer: Self-pay

## 2023-04-28 ENCOUNTER — Encounter (HOSPITAL_BASED_OUTPATIENT_CLINIC_OR_DEPARTMENT_OTHER): Payer: Self-pay

## 2023-04-28 DIAGNOSIS — Z1231 Encounter for screening mammogram for malignant neoplasm of breast: Secondary | ICD-10-CM | POA: Diagnosis present

## 2023-04-28 NOTE — Telephone Encounter (Signed)
  Follow up Call-     04/25/2023    1:21 PM  Call back number  Post procedure Call Back phone  # [phone]  Permission to leave phone message Yes     Patient questions:  Do you have a fever, pain , or abdominal swelling? No. Pain Score  0 *  Have you tolerated food without any problems? Yes.    Have you been able to return to your normal activities? Yes.    Do you have any questions about your discharge instructions: Diet   No. Medications  No. Follow up visit  No.  Do you have questions or concerns about your Care? No.  Actions: * If pain score is 4 or above: No action needed, pain <4.

## 2023-04-30 LAB — SURGICAL PATHOLOGY

## 2023-05-06 ENCOUNTER — Encounter: Payer: Self-pay | Admitting: Gastroenterology

## 2023-05-21 ENCOUNTER — Other Ambulatory Visit: Payer: Self-pay | Admitting: Medical

## 2023-05-21 DIAGNOSIS — E210 Primary hyperparathyroidism: Secondary | ICD-10-CM

## 2023-05-21 DIAGNOSIS — E538 Deficiency of other specified B group vitamins: Secondary | ICD-10-CM

## 2023-05-21 DIAGNOSIS — E559 Vitamin D deficiency, unspecified: Secondary | ICD-10-CM

## 2023-06-11 ENCOUNTER — Encounter: Payer: Medicare Other | Admitting: Gastroenterology

## 2023-11-10 ENCOUNTER — Other Ambulatory Visit: Payer: Self-pay | Admitting: Cardiology

## 2023-11-10 DIAGNOSIS — E538 Deficiency of other specified B group vitamins: Secondary | ICD-10-CM

## 2023-11-10 DIAGNOSIS — E210 Primary hyperparathyroidism: Secondary | ICD-10-CM

## 2023-11-10 DIAGNOSIS — E559 Vitamin D deficiency, unspecified: Secondary | ICD-10-CM
